# Patient Record
Sex: Female | Born: 1990 | Race: White | Hispanic: No | Marital: Single | Smoking: Current every day smoker
Health system: Southern US, Community
[De-identification: ages and names within clinical notes are randomized; demographics above are authoritative.]

## PROBLEM LIST (undated history)

## (undated) ENCOUNTER — Emergency Department (HOSPITAL_COMMUNITY): Payer: BC Managed Care – PPO | Source: Home / Self Care

## (undated) ENCOUNTER — Inpatient Hospital Stay (HOSPITAL_COMMUNITY): Payer: Self-pay

## (undated) ENCOUNTER — Emergency Department (HOSPITAL_COMMUNITY): Admission: EM | Payer: Self-pay | Source: Home / Self Care

## (undated) DIAGNOSIS — R519 Headache, unspecified: Secondary | ICD-10-CM

## (undated) DIAGNOSIS — F32A Depression, unspecified: Secondary | ICD-10-CM

## (undated) DIAGNOSIS — O98519 Other viral diseases complicating pregnancy, unspecified trimester: Secondary | ICD-10-CM

## (undated) DIAGNOSIS — F419 Anxiety disorder, unspecified: Secondary | ICD-10-CM

## (undated) DIAGNOSIS — B079 Viral wart, unspecified: Secondary | ICD-10-CM

## (undated) DIAGNOSIS — R55 Syncope and collapse: Secondary | ICD-10-CM

## (undated) DIAGNOSIS — Z3490 Encounter for supervision of normal pregnancy, unspecified, unspecified trimester: Secondary | ICD-10-CM

## (undated) DIAGNOSIS — F1120 Opioid dependence, uncomplicated: Secondary | ICD-10-CM

## (undated) DIAGNOSIS — F329 Major depressive disorder, single episode, unspecified: Secondary | ICD-10-CM

## (undated) DIAGNOSIS — F1190 Opioid use, unspecified, uncomplicated: Secondary | ICD-10-CM

## (undated) DIAGNOSIS — B009 Herpesviral infection, unspecified: Secondary | ICD-10-CM

## (undated) DIAGNOSIS — R51 Headache: Secondary | ICD-10-CM

## (undated) DIAGNOSIS — A6000 Herpesviral infection of urogenital system, unspecified: Secondary | ICD-10-CM

## (undated) DIAGNOSIS — J189 Pneumonia, unspecified organism: Secondary | ICD-10-CM

## (undated) HISTORY — PX: OTHER SURGICAL HISTORY: SHX169

## (undated) HISTORY — DX: Herpesviral infection of urogenital system, unspecified: A60.00

## (undated) HISTORY — DX: Herpesviral infection, unspecified: B00.9

## (undated) HISTORY — DX: Viral wart, unspecified: B07.9

## (undated) HISTORY — PX: TONSILLECTOMY: SUR1361

## (undated) HISTORY — DX: Encounter for supervision of normal pregnancy, unspecified, unspecified trimester: Z34.90

## (undated) HISTORY — DX: Other viral diseases complicating pregnancy, unspecified trimester: O98.519

---
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
## 2012-01-26 ENCOUNTER — Encounter (HOSPITAL_COMMUNITY): Payer: Self-pay | Admitting: *Deleted

## 2012-01-26 ENCOUNTER — Emergency Department (HOSPITAL_COMMUNITY)
Admission: EM | Admit: 2012-01-26 | Discharge: 2012-01-26 | Disposition: A | Discharge status: Home / Self Care | Payer: BC Managed Care – PPO | Attending: Emergency Medicine | Admitting: Emergency Medicine

## 2012-01-26 DIAGNOSIS — K029 Dental caries, unspecified: Secondary | ICD-10-CM | POA: Insufficient documentation

## 2012-01-26 DIAGNOSIS — K0889 Other specified disorders of teeth and supporting structures: Secondary | ICD-10-CM

## 2012-01-26 MED ORDER — PENICILLIN V POTASSIUM 500 MG PO TABS: 500.0000 mg | ORAL_TABLET | Freq: Four times a day (QID) | ORAL | Status: AC

## 2012-01-26 MED ORDER — PENICILLIN V POTASSIUM 250 MG PO TABS
500.0000 mg | ORAL_TABLET | Freq: Once | ORAL | Status: AC
  Administered 2012-01-26: 500 mg via ORAL
  Filled 2012-01-26: qty 2

## 2012-01-26 MED ORDER — IBUPROFEN 800 MG PO TABS
800.0000 mg | ORAL_TABLET | Freq: Once | ORAL | Status: AC
  Administered 2012-01-26: 800 mg via ORAL
  Filled 2012-01-26: qty 1

## 2012-01-26 MED ORDER — HYDROCODONE-ACETAMINOPHEN 5-325 MG PO TABS: 1.0000 | ORAL_TABLET | Freq: Four times a day (QID) | ORAL | Status: AC | PRN

## 2012-01-26 MED ORDER — HYDROCODONE-ACETAMINOPHEN 5-325 MG PO TABS
1.0000 | ORAL_TABLET | Freq: Once | ORAL | Status: AC
  Administered 2012-01-26: 1 via ORAL
  Filled 2012-01-26: qty 1

## 2012-01-26 NOTE — ED Provider Notes (Signed)
History     CSN: [id number]  Arrival date & time 01/26/12  4098   First MD Initiated Contact with Patient 01/26/12 1933      Chief Complaint  Patient presents with  . Dental Pain  . Jaw Pain    (Consider location/radiation/quality/duration/timing/severity/associated sxs/prior treatment) HPI Comments: "i think my wisdom teeth are coming in".  Has dental appt in 5 days.  No fever or chills.  Patient is a 21 y.o. female presenting with tooth pain. The history is provided by the patient. No language interpreter was used.  Dental PainThe primary symptoms include mouth pain. Primary symptoms do not include dental injury or fever. Episode onset: 1 month ago. The symptoms are unchanged. The symptoms occur constantly.  Additional symptoms include: jaw pain. Additional symptoms do not include: gum swelling, trismus, facial swelling, ear pain and swollen glands.    History reviewed. No pertinent past medical history.  Past Surgical History  Procedure Date  . Tonsillectomy     History reviewed. No pertinent family history.  History  Substance Use Topics  . Smoking status: Never Smoker   . Smokeless tobacco: Never Used  . Alcohol Use: No    OB History    Grav Para Term Preterm Abortions TAB SAB Ect Mult Living                  Review of Systems  Constitutional: Negative for fever and chills.  HENT: Positive for dental problem. Negative for ear pain and facial swelling.   All other systems reviewed and are negative.    Allergies  Review of patient's allergies indicates no known allergies.  Home Medications   Current Outpatient Rx  Name Route Sig Dispense Refill  . HYDROCODONE-ACETAMINOPHEN 5-325 MG PO TABS Oral Take 1 tablet by mouth every 6 (six) hours as needed for pain. 20 tablet 0  . LIDOCAINE 5 % EX OINT Topical Apply topically as needed. Apply with Q-tip to most problematic areas sparingly 35.44 g 0  . PENICILLIN V POTASSIUM 500 MG PO TABS Oral Take 1 tablet (500  mg total) by mouth 4 (four) times daily. 40 tablet 0  . SENNOSIDES-DOCUSATE SODIUM 8.6-50 MG PO TABS Oral Take 2 tablets by mouth daily. 14 tablet 0  . VALACYCLOVIR HCL 1 G PO TABS Oral Take 1 tablet (1,000 mg total) by mouth 2 (two) times daily. 20 tablet 0    BP 102/64  Pulse 84  Temp 98.4 F (36.9 C) (Oral)  Resp 20  Ht 5\' 9"  (1.753 m)  Wt 230 lb (104.327 kg)  BMI 33.96 kg/m2  SpO2 100%  LMP 01/26/2012  Physical Exam  Nursing note and vitals reviewed. Constitutional: She is oriented to person, place, and time. She appears well-developed and well-nourished. No distress.  HENT:  Head: Normocephalic and atraumatic.  Mouth/Throat: Mucous membranes are normal. Normal dentition. Dental caries present. No uvula swelling. No oropharyngeal exudate, posterior oropharyngeal edema, posterior oropharyngeal erythema or tonsillar abscesses.    Eyes: EOM are normal.  Neck: Normal range of motion.  Cardiovascular: Normal rate, regular rhythm and normal heart sounds.   Pulmonary/Chest: Effort normal and breath sounds normal.  Abdominal: Soft. She exhibits no distension. There is no tenderness.  Musculoskeletal: Normal range of motion.  Neurological: She is alert and oriented to person, place, and time.  Skin: Skin is warm and dry.  Psychiatric: She has a normal mood and affect. Judgment normal.    ED Course  Procedures (including critical care time)  Labs  Reviewed - No data to display No results found.   1. Pain, dental       MDM  rx-hydrocodone, 20 rx-pen VK 500 mg, 40 Ibuprofen 800 mg TID F/u with dentist as planned.        Evalina Field, [place] 01/26/12 1946

## 2012-01-26 NOTE — ED Notes (Signed)
Pt states she believes her wisdom teeth are coming in and they are causing her pain in her jaw.

## 2012-01-27 NOTE — ED Provider Notes (Signed)
Medical screening examination/treatment/procedure(s) were performed by non-physician practitioner and as supervising physician I was immediately available for consultation/collaboration.   Carleene Cooper III, MD 01/27/12 [phone]

## 2012-04-29 LAB — OB RESULTS CONSOLE RPR: RPR: NONREACTIVE

## 2012-04-29 LAB — OB RESULTS CONSOLE ANTIBODY SCREEN: Antibody Screen: NEGATIVE

## 2012-04-29 LAB — OB RESULTS CONSOLE VARICELLA ZOSTER ANTIBODY, IGG: Varicella: IMMUNE

## 2012-04-29 LAB — OB RESULTS CONSOLE ABO/RH

## 2012-04-29 LAB — OB RESULTS CONSOLE GC/CHLAMYDIA
Chlamydia: NEGATIVE
Gonorrhea: NEGATIVE

## 2012-04-29 LAB — CYSTIC FIBROSIS DIAGNOSTIC STUDY: Interpretation-CFDNA:: POSITIVE

## 2012-04-29 LAB — OB RESULTS CONSOLE HIV ANTIBODY (ROUTINE TESTING): HIV: NONREACTIVE

## 2012-08-12 LAB — OB RESULTS CONSOLE RPR: RPR: NONREACTIVE

## 2012-08-18 ENCOUNTER — Encounter: Payer: Self-pay | Admitting: *Deleted

## 2012-08-18 DIAGNOSIS — B079 Viral wart, unspecified: Secondary | ICD-10-CM

## 2012-08-18 DIAGNOSIS — B009 Herpesviral infection, unspecified: Secondary | ICD-10-CM

## 2012-09-02 ENCOUNTER — Encounter: Payer: Self-pay | Admitting: Obstetrics & Gynecology

## 2012-09-18 ENCOUNTER — Encounter: Payer: Self-pay | Admitting: Obstetrics and Gynecology

## 2012-09-18 ENCOUNTER — Ambulatory Visit (INDEPENDENT_AMBULATORY_CARE_PROVIDER_SITE_OTHER): Payer: Medicaid Other | Admitting: Obstetrics and Gynecology

## 2012-09-18 VITALS — BP 102/60 | Wt 277.4 lb

## 2012-09-18 DIAGNOSIS — O98519 Other viral diseases complicating pregnancy, unspecified trimester: Secondary | ICD-10-CM

## 2012-09-18 DIAGNOSIS — Z3403 Encounter for supervision of normal first pregnancy, third trimester: Secondary | ICD-10-CM

## 2012-09-18 DIAGNOSIS — Z1389 Encounter for screening for other disorder: Secondary | ICD-10-CM

## 2012-09-18 DIAGNOSIS — B009 Herpesviral infection, unspecified: Secondary | ICD-10-CM

## 2012-09-18 DIAGNOSIS — E669 Obesity, unspecified: Secondary | ICD-10-CM

## 2012-09-18 DIAGNOSIS — Z331 Pregnant state, incidental: Secondary | ICD-10-CM

## 2012-09-18 LAB — POCT URINALYSIS DIPSTICK
Blood, UA: NEGATIVE
Glucose, UA: NEGATIVE
Nitrite, UA: NEGATIVE
Protein, UA: NEGATIVE

## 2012-09-18 MED ORDER — ACYCLOVIR 400 MG PO TABS: 400.0000 mg | ORAL_TABLET | Freq: Two times a day (BID) | ORAL | Status: DC

## 2012-09-18 NOTE — Progress Notes (Signed)
[redacted]w[redacted]d   HSV II supression initiated Rx: Acyclovir 400 bid               Prenatal classes --yes planned              Navel pain: no hernia, rectus diastasis noted, discussed jvf

## 2012-09-18 NOTE — Progress Notes (Signed)
Pain in belly button.

## 2012-09-21 ENCOUNTER — Encounter (HOSPITAL_COMMUNITY): Payer: Self-pay | Admitting: *Deleted

## 2012-09-21 ENCOUNTER — Inpatient Hospital Stay (HOSPITAL_COMMUNITY)
Admission: AD | Admit: 2012-09-21 | Discharge: 2012-09-21 | Disposition: A | Discharge status: Home / Self Care | Payer: BC Managed Care – PPO | Source: Ambulatory Visit | Attending: Obstetrics & Gynecology | Admitting: Obstetrics & Gynecology

## 2012-09-21 DIAGNOSIS — O4700 False labor before 37 completed weeks of gestation, unspecified trimester: Secondary | ICD-10-CM | POA: Insufficient documentation

## 2012-09-21 DIAGNOSIS — R197 Diarrhea, unspecified: Secondary | ICD-10-CM | POA: Insufficient documentation

## 2012-09-21 DIAGNOSIS — O212 Late vomiting of pregnancy: Secondary | ICD-10-CM | POA: Insufficient documentation

## 2012-09-21 DIAGNOSIS — A084 Viral intestinal infection, unspecified: Secondary | ICD-10-CM

## 2012-09-21 DIAGNOSIS — A088 Other specified intestinal infections: Secondary | ICD-10-CM

## 2012-09-21 LAB — URINE MICROSCOPIC-ADD ON

## 2012-09-21 LAB — URINALYSIS, ROUTINE W REFLEX MICROSCOPIC
Glucose, UA: NEGATIVE mg/dL
Hgb urine dipstick: NEGATIVE
Ketones, ur: NEGATIVE mg/dL
Protein, ur: NEGATIVE mg/dL
Urobilinogen, UA: 0.2 mg/dL (ref 0.0–1.0)

## 2012-09-21 MED ORDER — PROMETHAZINE HCL 25 MG PO TABS: 25.0000 mg | ORAL_TABLET | Freq: Four times a day (QID) | ORAL | Status: DC | PRN

## 2012-09-21 NOTE — MAU Note (Signed)
Cramping and vomiting started this morning. No hx of PTL. Also having diarrhea. No one else at home is sick.  Isolation explained.  Pt unable to get urine spec at this time.

## 2012-09-21 NOTE — MAU Provider Note (Signed)
History     CSN: [id number]  Arrival date and time: 09/21/12 1025   None       Chief Complaint  Patient presents with  . Emesis  . Diarrhea  . Abdominal Pain   Emesis  Associated symptoms include abdominal pain, diarrhea and headaches. Pertinent negatives include no chills, dizziness or fever.  Diarrhea  Associated symptoms include abdominal pain, headaches and vomiting. Pertinent negatives include no chills or fever.  Abdominal Pain Associated symptoms include diarrhea, headaches, nausea and vomiting. Pertinent negatives include no constipation, dysuria, fever, frequency or hematuria.    Brittney Nicholson is 22 y.o. G1P who comes in crampy abdominal pain, diarrhea (3 episodes), with 1 episode of vomiting since this morning around 9 am. Pt states that her 4/10 abdominal pain is epigastric and also lower abdominal.  Pt denies any leakage of fluid or bleeding. Pt states that there is some pressure when she pees. Denies pain.  RN Note: Cramping and vomiting started this morning. No hx of PTL. Also having diarrhea. No one else at home is sick. Isolation explained. Pt unable to get urine spec at this time.    OB History   Grav Para Term Preterm Abortions TAB SAB Ect Mult Living   1         0      Past Medical History  Diagnosis Date  . HSV-2 infection complicating pregnancy   . Warts     Past Surgical History  Procedure Laterality Date  . Breast lumpectomy  2006  . Tonsillectomy  2011  . Right arm Right     has pins.    Family History  Problem Relation Age of Onset  . Cancer Mother     uterine  . Diabetes Father   . Kidney failure Father     History  Substance Use Topics  . Smoking status: Never Smoker   . Smokeless tobacco: Never Used  . Alcohol Use: No    Allergies:  Allergies  Allergen Reactions  . Gluten Other (See Comments)    RECTAL BLEEDING  . Wheat Other (See Comments)    Rectal bleeding    Prescriptions prior to admission  Medication Sig  Dispense Refill  . acetaminophen (TYLENOL) 500 MG tablet Take 500 mg by mouth every 6 (six) hours as needed for pain.      . Prenatal Vit-Fe Fumarate-FA (PRENATAL MULTIVITAMIN) TABS Take 1 tablet by mouth daily at 12 noon.      Marland Kitchen acyclovir (ZOVIRAX) 400 MG tablet Take 1 tablet (400 mg total) by mouth 2 (two) times daily.  60 tablet  2    Review of Systems  Constitutional: Negative for fever and chills.  Eyes: Positive for photophobia. Negative for blurred vision and double vision.  Respiratory: Negative.   Cardiovascular: Negative.   Gastrointestinal: Positive for heartburn, nausea, vomiting, abdominal pain and diarrhea. Negative for constipation and blood in stool.  Genitourinary: Negative for dysuria, urgency, frequency, hematuria and flank pain.  Neurological: Positive for headaches. Negative for dizziness.   Physical Exam   Temp:  [96.9 F (36.1 C)] 96.9 F (36.1 C) (04/14 1101) Pulse Rate:  [107] 107 (04/14 1101) Resp:  [18] 18 (04/14 1101) BP: (101)/(54) 101/54 mmHg (04/14 1101) SpO2:  [99 %] 99 % (04/14 1101) Weight:  [126.1 kg (278 lb)] 126.1 kg (278 lb) (04/14 1147)    Physical Exam  Constitutional: She is oriented to person, place, and time. She appears well-developed and well-nourished.  HENT:  Head:  Normocephalic and atraumatic.  Eyes: EOM are normal.  Cardiovascular: Normal rate, regular rhythm, normal heart sounds and intact distal pulses.   Respiratory: Breath sounds normal. No respiratory distress. She has no wheezes. She has no rales. She exhibits no tenderness.  GI: Soft. She exhibits no distension. There is no tenderness. There is no rebound.  Musculoskeletal: She exhibits edema (1+ LE edema bilaterally).  Neurological: She is alert and oriented to person, place, and time.   Results for orders placed during the hospital encounter of 09/21/12 (from the past 24 hour(s))  URINALYSIS, ROUTINE W REFLEX MICROSCOPIC     Status: Abnormal   Collection Time     09/21/12 11:59 AM      Result Value Range   Color, Urine YELLOW  YELLOW   APPearance HAZY (*) CLEAR   Specific Gravity, Urine 1.020  1.005 - 1.030   pH 6.5  5.0 - 8.0   Glucose, UA NEGATIVE  NEGATIVE mg/dL   Hgb urine dipstick NEGATIVE  NEGATIVE   Bilirubin Urine NEGATIVE  NEGATIVE   Ketones, ur NEGATIVE  NEGATIVE mg/dL   Protein, ur NEGATIVE  NEGATIVE mg/dL   Urobilinogen, UA 0.2  0.0 - 1.0 mg/dL   Nitrite NEGATIVE  NEGATIVE   Leukocytes, UA SMALL (*) NEGATIVE  URINE MICROSCOPIC-ADD ON     Status: Abnormal   Collection Time    09/21/12 11:59 AM      Result Value Range   Squamous Epithelial / LPF MANY (*) RARE   WBC, UA 3-6  <3 WBC/hpf   Bacteria, UA MANY (*) RARE   Fetal heart rate reactive.  Rare contractions. There are items on tracing that look like squared-off contractions, but RN sat at bedside and did not feel any contractions in over 15 minutes.  Patient states when she has a stomach cramp, she holds her breath until it is gone.    MAU Course  Procedures  MDM On toco and fetal heart monitoring.   Assessment and Plan  A:  SIUP at [redacted]w[redacted]d       Probable viral gastroenterits, diarrhea predominant (only vomited once)      Occasional contractions FHR reassuring baselines 140'.   P:  pt discharged home.        Return to clinic if symptoms worsen or fail to improve.        Encouraged PO hydration, avoid concentrated sweet juices while having diarrhea as it may make it worse       Follow up at [facility]  Rosalee Kaufman 09/21/2012, 1:16 PM

## 2012-09-21 NOTE — MAU Provider Note (Signed)
Attestation of Attending Supervision of Advanced Practitioner (CNM/NP): Evaluation and management procedures were performed by the Advanced Practitioner under my supervision and collaboration. I have reviewed the Advanced Practitioner's note and chart, and I agree with the management and plan.  Legacy Carrender H. 5:08 PM   

## 2012-09-22 LAB — URINE CULTURE: Colony Count: 70000

## 2012-09-29 ENCOUNTER — Telehealth: Payer: Self-pay | Admitting: *Deleted

## 2012-09-29 NOTE — Telephone Encounter (Signed)
Pt states "lost mucous plug today", + FM, no bleeding, no contractions, explained to pt if she has contractions 5-10 minutes apart or has gush of fluids go to [facility]. Pt has an appt Friday for routine prenatal visit. Pt verbalized understanding.

## 2012-10-02 ENCOUNTER — Encounter: Payer: Medicaid Other | Admitting: Obstetrics & Gynecology

## 2012-10-06 ENCOUNTER — Ambulatory Visit (INDEPENDENT_AMBULATORY_CARE_PROVIDER_SITE_OTHER): Payer: Medicaid Other | Admitting: Obstetrics & Gynecology

## 2012-10-06 ENCOUNTER — Encounter: Payer: Self-pay | Admitting: Obstetrics & Gynecology

## 2012-10-06 VITALS — BP 110/60 | Wt 284.0 lb

## 2012-10-06 DIAGNOSIS — O98519 Other viral diseases complicating pregnancy, unspecified trimester: Secondary | ICD-10-CM

## 2012-10-06 DIAGNOSIS — E669 Obesity, unspecified: Secondary | ICD-10-CM

## 2012-10-06 DIAGNOSIS — Z3403 Encounter for supervision of normal first pregnancy, third trimester: Secondary | ICD-10-CM

## 2012-10-06 DIAGNOSIS — Z331 Pregnant state, incidental: Secondary | ICD-10-CM

## 2012-10-06 DIAGNOSIS — Z1389 Encounter for screening for other disorder: Secondary | ICD-10-CM

## 2012-10-06 NOTE — Progress Notes (Signed)
Pelvic pain and pressure.Labia swelling at night.

## 2012-10-06 NOTE — Patient Instructions (Signed)
Epidural Risks and Benefits The continuous putting in (infusion) of local anesthetics through a long, narrow, hollow plastic tube (catheter)/needle into the lower (lumbar) area of your spine is commonly called an epidural. This means outside the covering of the spinal cord. The epidural catheter is placed in the space on the outside of the membrane that covers the spinal cord. The anesthetic medicine numbs the nerves of the spinal cord in the epidural space. There is also a spinal/epidural anesthetic using two needles and a catheter. The medication is first placed in the spinal canal. Then that needle is removed and a catheter is placed in the epidural space through the second needle for continuous anesthesia. This seems to be the most popular type of regional anesthesia used now. This is sometimes given for pain management to women who are giving birth. Spinal and epidural anesthesia are called regional anesthesia because they numb a certain region of the body. While it is an effective pain management tool, some reasons not to use this include:  Restricted mobility: The tubes and monitors connected to you do not allow for much moving around.  Increased likelihood of bladder catheterization, oxytocin administration, and internal monitoring. This means a tube (catheter) may have to be put into the bladder to drain the urine. Uterine contractions can become weaker and less frequent. They also may have a higher use of oxytocin than mothers not having regional anesthesia.  Increased likelihood of operative delivery: This includes the use of or need for forceps, vacuum extractor, episiotomy, or cesarean delivery. When the dose is too large, or when it sinks down into the "tailbone" (sacral) region of the body, the perineum and the birth canal (vagina) are anesthetized. Anesthetic is injected into this area late in labor to deaden all sensation. When it "accidentally" happens earlier in labor, the muscles of the  pelvic floor are relaxed too early. This interferes with the normal flexion and rotation of the baby's head as it passes through the birth canal. This interference can lead to abnormal presentations that are more dangerous for the baby.  Must use an automatic blood pressure cuff throughout labor. This is a cuff that automatically takes your blood pressure at regular intervals. SHORT TERM MATERNAL RISKS  Dural puncture - The dura is one of the membranes surrounding the spinal cord. If the anesthetic medication gets into the spinal canal through a dural puncture, it can result in a spinal anesthetic and spinal headache. Spinal headaches are treated with an epidural blood patch to cover the punctured area.  Low blood pressure (hypotension) - Nearly one third of women with an epidural will develop low blood pressure. The ways that patients must lay during the epidural can make this worse. Their position is limited because they will be unable to move their legs easily for the time of the anesthetic. Low blood pressure is also a risk for the baby. If the baby does not get enough oxygen from the mom's blood, it can result in an emergency Cesarean section. This means the baby is delivered by an operation through a cut by the surgeon (incision) on the belly of the mother.  Nausea, vomiting, and prolonged shivering.  Prolonged labor - With large doses of anesthetic medication, the patient loses the desire and the ability to bear down and push. This results in an increased use of forceps and vacuum extractions, compared to women having unmedicated deliveries.  Uneven, incomplete or non-existent pain relief. Sometimes the epidural does not work well and   additional medications may be needed for pain relief.  Difficulty breathing well or paralysis if the level of anesthesia goes too high in the spine.  Convulsions - If the anesthetic agent accidentally is injected into a blood vessel it can cause convulsions and  loss of consciousness.  Toxic drug reactions.  Septic meningitis - An abscess can form at the site where the epidural catheter is placed. If this spreads into the spinal canal it can cause meningitis.  Allergic reaction - This causes blood pressure to become too low and other medications and fluids must be given to bring the blood pressure up. Also rashes and difficulty breathing may develop.  Cardiac arrest - This is rare but real threat to the life of the mother and baby.  Fever is common.  Itching that is easily treated.  Spinal hematoma. LONG TERM MATERNAL RISKS  Neurological complications - A nerve problem called Horner's syndrome can develop with epidural anesthesia for vaginal delivery. It is impossible to predict which patients will develop a Horner's syndrome. Even the nerves to the face can be blocked, temporarily or permanently. Tremors and shakes can occur.  Paresthesia ("pins and needles"). This is a feeling that comes from inflammation of a nerve.  Dizziness and fainting can become a problem after epidurals. This is usually only for a couple of days. RISKS TO BABY  Direct drug toxicity.  Fetal distress, abnormal fetal heart rate (FHR) (can lead to emergency cesarean). This is especially true if the anesthetic gets into the mother's blood stream or too much medication is put into the epidural. REASONS NOT TO HAVE EPIDURAL ANESTHESIA  Increased costs.  The mother has a low blood pressure.  There are blood clotting problems.  A brain tumor is present.  There is an infection in the blood stream.  A skin infection at the needle site.  A tattoo at the needle site. BENEFITS  Regional anesthesia is the most effective pain relief for labor and delivery.  It is the best anesthetic for preeclampsia and eclampsia.  There is better pain control after delivery (vaginal or cesarean).  When done correctly, no medication gets to the baby.  Sooner ambulation after  delivery.  It can be left in place during all of labor.  You can be awake during a Cesarean delivery and see the baby immediately after delivery. AFTER THE PROCEDURE   You will be kept in bed for several hours to prevent headaches.  You will be kept in bed until your legs are no longer numb and it is safe to walk.  The length of time you spend in the hospital will depend on the type of surgery or procedure you have had.  The epidural catheter is removed after you no longer need it for pain. HOME CARE INSTRUCTIONS   Do not drive or operate any kind of machinery for at least 24 hours. Make sure there is someone to drive you home.  Do not drink alcohol for at least 24 hours after the anesthesia.  Do not make important decisions for at least 24 hours after the anesthesia.  Drink lots of fluids.  Return to your normal diet.  Keep all your postoperative appointments as scheduled. SEEK IMMEDIATE MEDICAL CARE IF:  You develop a fever or temperature over 98.6 F (37 C).  You have a persistent headache.  You develop dizziness, fainting or lightheadedness.  You develop weakness, numbness or tingling in your arms or legs.  You have a skin rash.  You   have difficulty breathing  You have a stiff neck with or without stiff back.  You develop chest pain. Document Released: 05/27/2005 Document Revised: 08/19/2011 Document Reviewed: 07/04/2008 Exit[employer] Patient Information 2013 [employer].  

## 2012-10-06 NOTE — Progress Notes (Signed)
BP weight and urine results all reviewed and noted. Patient reports good fetal movement, denies any bleeding and no rupture of membranes symptoms or regular contractions. Patient is without complaints. All questions were answered.  

## 2012-10-06 NOTE — Addendum Note (Signed)
Addended by: Richardson Chiquito on: 10/06/2012 12:25 PM   Modules accepted: Orders

## 2012-10-07 LAB — GC/CHLAMYDIA PROBE AMP
CT Probe RNA: NEGATIVE
GC Probe RNA: NEGATIVE

## 2012-10-14 ENCOUNTER — Encounter: Payer: Self-pay | Admitting: Obstetrics & Gynecology

## 2012-10-14 ENCOUNTER — Ambulatory Visit (INDEPENDENT_AMBULATORY_CARE_PROVIDER_SITE_OTHER): Payer: Medicaid Other | Admitting: Obstetrics & Gynecology

## 2012-10-14 VITALS — BP 110/70 | Wt 288.0 lb

## 2012-10-14 DIAGNOSIS — Z331 Pregnant state, incidental: Secondary | ICD-10-CM

## 2012-10-14 DIAGNOSIS — Z1389 Encounter for screening for other disorder: Secondary | ICD-10-CM

## 2012-10-14 DIAGNOSIS — O98519 Other viral diseases complicating pregnancy, unspecified trimester: Secondary | ICD-10-CM

## 2012-10-14 DIAGNOSIS — E669 Obesity, unspecified: Secondary | ICD-10-CM

## 2012-10-14 LAB — POCT URINALYSIS DIPSTICK: Protein, UA: NEGATIVE

## 2012-10-14 NOTE — Progress Notes (Signed)
BP weight and urine results all reviewed and noted. Patient reports good fetal movement, denies any bleeding and no rupture of membranes symptoms or regular contractions. Patient is without complaints. All questions were answered.  

## 2012-10-14 NOTE — Patient Instructions (Signed)
Breastfeeding Deciding to breastfeed is one of the best choices you can make for you and your baby. The information that follows gives a brief overview of the benefits of breastfeeding as well as common topics surrounding breastfeeding. BENEFITS OF BREASTFEEDING For the baby  The first milk (colostrum) helps the baby's digestive system function better.   There are antibodies in the mother's milk that help the baby fight off infections.   The baby has a lower incidence of asthma, allergies, and sudden infant death syndrome (SIDS).   The nutrients in breast milk are better for the baby than infant formulas, and breast milk helps the baby's brain grow better.   Babies who breastfeed have less gas, colic, and constipation.  For the mother  Breastfeeding helps develop a very special bond between the mother and her baby.   Breastfeeding is convenient, always available at the correct temperature, and costs nothing.   Breastfeeding burns calories in the mother and helps her lose weight that was gained during pregnancy.   Breastfeeding makes the uterus contract back down to normal size faster and slows bleeding following delivery.   Breastfeeding mothers have a lower risk of developing breast cancer.  BREASTFEEDING FREQUENCY  A healthy, full-term baby may breastfeed as often as every hour or space his or her feedings to every 3 hours.   Watch your baby for signs of hunger. Nurse your baby if he or she shows signs of hunger. How often you nurse will vary from baby to baby.   Nurse as often as the baby requests, or when you feel the need to reduce the fullness of your breasts.   Awaken the baby if it has been 3 4 hours since the last feeding.   Frequent feeding will help the mother make more milk and will help prevent problems, such as sore nipples and engorgement of the breasts.  BABY'S POSITION AT THE BREAST  Whether lying down or sitting, be sure that the baby's tummy is  facing your tummy.   Support the breast with 4 fingers underneath the breast and the thumb above. Make sure your fingers are well away from the nipple and baby's mouth.   Stroke the baby's lips gently with your finger or nipple.   When the baby's mouth is open wide enough, place all of your nipple and as much of the areola as possible into your baby's mouth.   Pull the baby in close so the tip of the nose and the baby's cheeks touch the breast during the feeding.  FEEDINGS AND SUCTION  The length of each feeding varies from baby to baby and from feeding to feeding.   The baby must suck about 2 3 minutes for your milk to get to him or her. This is called a "let down." For this reason, allow the baby to feed on each breast as long as he or she wants. Your baby will end the feeding when he or she has received the right balance of nutrients.   To break the suction, put your finger into the corner of the baby's mouth and slide it between his or her gums before removing your breast from his or her mouth. This will help prevent sore nipples.  HOW TO TELL WHETHER YOUR BABY IS GETTING ENOUGH BREAST MILK. Wondering whether or not your baby is getting enough milk is a common concern among mothers. You can be assured that your baby is getting enough milk if:   Your baby is actively   sucking and you hear swallowing.   Your baby seems relaxed and satisfied after a feeding.   Your baby nurses at least 8 12 times in a 24 hour time period. Nurse your baby until he or she unlatches or falls asleep at the first breast (at least 10 20 minutes), then offer the second side.   Your baby is wetting 5 6 disposable diapers (6 8 cloth diapers) in a 24 hour period by 5 6 days of age.   Your baby is having at least 3 4 stools every 24 hours for the first 6 weeks. The stool should be soft and yellow.   Your baby should gain 4 7 ounces per week after he or she is 4 days old.   Your breasts feel softer  after nursing.  REDUCING BREAST ENGORGEMENT  In the first week after your baby is born, you may experience signs of breast engorgement. When breasts are engorged, they feel heavy, warm, full, and may be tender to the touch. You can reduce engorgement if you:   Nurse frequently, every 2 3 hours. Mothers who breastfeed early and often have fewer problems with engorgement.   Place light ice packs on your breasts for 10 20 minutes between feedings. This reduces swelling. Wrap the ice packs in a lightweight towel to protect your skin. Bags of frozen vegetables work well for this purpose.   Take a warm shower or apply warm, moist heat to your breast for 5 10 minutes just before each feeding. This increases circulation and helps the milk flow.   Gently massage your breast before and during the feeding. Using your finger tips, massage from the chest wall towards your nipple in a circular motion.   Make sure that the baby empties at least one breast at every feeding before switching sides.   Use a breast pump to empty the breasts if your baby is sleepy or not nursing well. You may also want to pump if you are returning to work oryou feel you are getting engorged.   Avoid bottle feeds, pacifiers, or supplemental feedings of water or juice in place of breastfeeding. Breast milk is all the food your baby needs. It is not necessary for your baby to have water or formula. In fact, to help your breasts make more milk, it is best not to give your baby supplemental feedings during the early weeks.   Be sure the baby is latched on and positioned properly while breastfeeding.   Wear a supportive bra, avoiding underwire styles.   Eat a balanced diet with enough fluids.   Rest often, relax, and take your prenatal vitamins to prevent fatigue, stress, and anemia.  If you follow these suggestions, your engorgement should improve in 24 48 hours. If you are still experiencing difficulty, call your  lactation consultant or caregiver.  CARING FOR YOURSELF Take care of your breasts  Bathe or shower daily.   Avoid using soap on your nipples.   Start feedings on your left breast at one feeding and on your right breast at the next feeding.   You will notice an increase in your milk supply 2 5 days after delivery. You may feel some discomfort from engorgement, which makes your breasts very firm and often tender. Engorgement "peaks" out within 24 48 hours. In the meantime, apply warm moist towels to your breasts for 5 10 minutes before feeding. Gentle massage and expression of some milk before feeding will soften your breasts, making it easier for your   baby to latch on.   Wear a well-fitting nursing bra, and air dry your nipples for a 3 4minutes after each feeding.   Only use cotton bra pads.   Only use pure lanolin on your nipples after nursing. You do not need to wash it off before feeding the baby again. Another option is to express a few drops of breast milk and gently massage it into your nipples.  Take care of yourself  Eat well-balanced meals and nutritious snacks.   Drinking milk, fruit juice, and water to satisfy your thirst (about 8 glasses a day).   Get plenty of rest.  Avoid foods that you notice affect the baby in a bad way.  SEEK MEDICAL CARE IF:   You have difficulty with breastfeeding and need help.   You have a hard, red, sore area on your breast that is accompanied by a fever.   Your baby is too sleepy to eat well or is having trouble sleeping.   Your baby is wetting less than 6 diapers a day, by 5 days of age.   Your baby's skin or white part of his or her eyes is more yellow than it was in the hospital.   You feel depressed.  Document Released: 05/27/2005 Document Revised: 11/26/2011 Document Reviewed: 08/25/2011 [employer] Patient Information 2013 [employer].  

## 2012-10-14 NOTE — Progress Notes (Signed)
VAGINAL DISCHARGE, WHITE IN COLOR. ITCHING AND BURNING AT NIGHT.

## 2012-10-21 ENCOUNTER — Encounter: Payer: Medicaid Other | Admitting: Obstetrics & Gynecology

## 2012-10-22 ENCOUNTER — Ambulatory Visit (INDEPENDENT_AMBULATORY_CARE_PROVIDER_SITE_OTHER): Payer: Medicaid Other | Admitting: Obstetrics & Gynecology

## 2012-10-22 VITALS — BP 100/60 | Wt 293.0 lb

## 2012-10-22 DIAGNOSIS — Z3403 Encounter for supervision of normal first pregnancy, third trimester: Secondary | ICD-10-CM

## 2012-10-22 DIAGNOSIS — Z3400 Encounter for supervision of normal first pregnancy, unspecified trimester: Secondary | ICD-10-CM | POA: Insufficient documentation

## 2012-10-22 DIAGNOSIS — Z331 Pregnant state, incidental: Secondary | ICD-10-CM

## 2012-10-22 DIAGNOSIS — O98519 Other viral diseases complicating pregnancy, unspecified trimester: Secondary | ICD-10-CM

## 2012-10-22 DIAGNOSIS — O99210 Obesity complicating pregnancy, unspecified trimester: Secondary | ICD-10-CM

## 2012-10-22 DIAGNOSIS — Z1389 Encounter for screening for other disorder: Secondary | ICD-10-CM

## 2012-10-22 LAB — POCT URINALYSIS DIPSTICK
Glucose, UA: NEGATIVE
Ketones, UA: NEGATIVE

## 2012-10-22 NOTE — Progress Notes (Signed)
BP weight and urine results all reviewed and noted. Patient reports good fetal movement, denies any bleeding and no rupture of membranes symptoms or regular contractions. Patient is without complaints. All questions were answered.  

## 2012-10-26 LAB — POCT URINALYSIS DIPSTICK
Ketones, UA: NEGATIVE
Nitrite, UA: NEGATIVE

## 2012-10-26 NOTE — Addendum Note (Signed)
Addended by: Richardson Chiquito on: 10/26/2012 01:55 PM   Modules accepted: Orders

## 2012-10-27 ENCOUNTER — Encounter: Payer: Self-pay | Admitting: Advanced Practice Midwife

## 2012-10-27 ENCOUNTER — Ambulatory Visit (INDEPENDENT_AMBULATORY_CARE_PROVIDER_SITE_OTHER): Payer: Medicaid Other | Admitting: Advanced Practice Midwife

## 2012-10-27 VITALS — BP 112/76 | Wt 294.0 lb

## 2012-10-27 DIAGNOSIS — O98519 Other viral diseases complicating pregnancy, unspecified trimester: Secondary | ICD-10-CM

## 2012-10-27 DIAGNOSIS — O99210 Obesity complicating pregnancy, unspecified trimester: Secondary | ICD-10-CM

## 2012-10-27 DIAGNOSIS — Z3403 Encounter for supervision of normal first pregnancy, third trimester: Secondary | ICD-10-CM

## 2012-10-27 DIAGNOSIS — Z1389 Encounter for screening for other disorder: Secondary | ICD-10-CM

## 2012-10-27 DIAGNOSIS — Z331 Pregnant state, incidental: Secondary | ICD-10-CM

## 2012-10-27 LAB — POCT URINALYSIS DIPSTICK
Blood, UA: NEGATIVE
Nitrite, UA: NEGATIVE

## 2012-10-27 NOTE — Progress Notes (Signed)
No c/o at this time.  Routine questions about pregnancy answered.  F/U in 1 weeks for LROB.  

## 2012-10-27 NOTE — Progress Notes (Signed)
C/o "alot of vaginal pressure"

## 2012-10-29 ENCOUNTER — Encounter: Payer: Medicaid Other | Admitting: Advanced Practice Midwife

## 2012-11-03 ENCOUNTER — Ambulatory Visit (INDEPENDENT_AMBULATORY_CARE_PROVIDER_SITE_OTHER): Payer: Medicaid Other | Admitting: Obstetrics & Gynecology

## 2012-11-03 ENCOUNTER — Telehealth (HOSPITAL_COMMUNITY): Payer: Self-pay | Admitting: *Deleted

## 2012-11-03 VITALS — BP 120/72 | Wt 294.0 lb

## 2012-11-03 DIAGNOSIS — O99210 Obesity complicating pregnancy, unspecified trimester: Secondary | ICD-10-CM

## 2012-11-03 DIAGNOSIS — Z3403 Encounter for supervision of normal first pregnancy, third trimester: Secondary | ICD-10-CM

## 2012-11-03 NOTE — Telephone Encounter (Signed)
Preadmission screen  

## 2012-11-03 NOTE — Progress Notes (Signed)
BP weight and urine results all reviewed and noted. Patient reports good fetal movement, denies any bleeding and no rupture of membranes symptoms or regular contractions. Patient is without complaints. All questions were answered. Induction on Saturday 5.31.2014

## 2012-11-04 ENCOUNTER — Telehealth (HOSPITAL_COMMUNITY): Payer: Self-pay | Admitting: *Deleted

## 2012-11-04 ENCOUNTER — Encounter (HOSPITAL_COMMUNITY): Payer: Self-pay | Admitting: *Deleted

## 2012-11-04 NOTE — Telephone Encounter (Signed)
Preadmission screen  

## 2012-11-07 ENCOUNTER — Encounter (HOSPITAL_COMMUNITY): Payer: Self-pay

## 2012-11-07 ENCOUNTER — Inpatient Hospital Stay (HOSPITAL_COMMUNITY)
Admission: RE | Admit: 2012-11-07 | Discharge: 2012-11-10 | DRG: 372 | Disposition: A | Discharge status: Home / Self Care | Payer: BC Managed Care – PPO | Source: Ambulatory Visit | Attending: Obstetrics and Gynecology | Admitting: Obstetrics and Gynecology

## 2012-11-07 VITALS — BP 111/73 | HR 94 | Temp 97.6°F | Resp 20 | Ht 67.0 in | Wt 294.0 lb

## 2012-11-07 DIAGNOSIS — B009 Herpesviral infection, unspecified: Secondary | ICD-10-CM

## 2012-11-07 DIAGNOSIS — Z141 Cystic fibrosis carrier: Secondary | ICD-10-CM

## 2012-11-07 DIAGNOSIS — Z3403 Encounter for supervision of normal first pregnancy, third trimester: Secondary | ICD-10-CM

## 2012-11-07 DIAGNOSIS — O480 Post-term pregnancy: Principal | ICD-10-CM | POA: Diagnosis present

## 2012-11-07 LAB — CBC
HCT: 36.4 % (ref 36.0–46.0)
Hemoglobin: 11.9 g/dL — ABNORMAL LOW (ref 12.0–15.0)
RBC: 4.42 MIL/uL (ref 3.87–5.11)
WBC: 10.4 10*3/uL (ref 4.0–10.5)

## 2012-11-07 LAB — TYPE AND SCREEN: Antibody Screen: NEGATIVE

## 2012-11-07 MED ORDER — LIDOCAINE HCL (PF) 1 % IJ SOLN
30.0000 mL | INTRAMUSCULAR | Status: DC | PRN
  Filled 2012-11-07: qty 30

## 2012-11-07 MED ORDER — OXYTOCIN BOLUS FROM INFUSION
500.0000 mL | INTRAVENOUS | Status: DC
  Administered 2012-11-08: 500 mL via INTRAVENOUS

## 2012-11-07 MED ORDER — OXYTOCIN 40 UNITS IN LACTATED RINGERS INFUSION - SIMPLE MED
1.0000 m[IU]/min | INTRAVENOUS | Status: DC
  Administered 2012-11-08: 2 m[IU]/min via INTRAVENOUS
  Filled 2012-11-07: qty 1000

## 2012-11-07 MED ORDER — OXYTOCIN 40 UNITS IN LACTATED RINGERS INFUSION - SIMPLE MED: 62.5000 mL/h | INTRAVENOUS | Status: DC

## 2012-11-07 MED ORDER — FLEET ENEMA 7-19 GM/118ML RE ENEM: 1.0000 | ENEMA | RECTAL | Status: DC | PRN

## 2012-11-07 MED ORDER — CITRIC ACID-SODIUM CITRATE 334-500 MG/5ML PO SOLN: 30.0000 mL | ORAL | Status: DC | PRN

## 2012-11-07 MED ORDER — ACETAMINOPHEN 325 MG PO TABS
650.0000 mg | ORAL_TABLET | ORAL | Status: DC | PRN
  Administered 2012-11-08: 650 mg via ORAL
  Filled 2012-11-07: qty 2

## 2012-11-07 MED ORDER — ONDANSETRON HCL 4 MG/2ML IJ SOLN: 4.0000 mg | Freq: Four times a day (QID) | INTRAMUSCULAR | Status: DC | PRN

## 2012-11-07 MED ORDER — LACTATED RINGERS IV SOLN: 500.0000 mL | INTRAVENOUS | Status: DC | PRN

## 2012-11-07 MED ORDER — LACTATED RINGERS IV SOLN
INTRAVENOUS | Status: DC
  Administered 2012-11-07: 950 mL via INTRAVENOUS
  Administered 2012-11-08: 02:00:00 via INTRAVENOUS
  Administered 2012-11-08: 125 mL/h via INTRAVENOUS

## 2012-11-07 MED ORDER — FENTANYL CITRATE 0.05 MG/ML IJ SOLN
100.0000 ug | INTRAMUSCULAR | Status: DC | PRN
  Administered 2012-11-07: 100 ug via INTRAVENOUS
  Filled 2012-11-07: qty 2

## 2012-11-07 MED ORDER — TERBUTALINE SULFATE 1 MG/ML IJ SOLN: 0.2500 mg | Freq: Once | INTRAMUSCULAR | Status: AC | PRN

## 2012-11-07 MED ORDER — IBUPROFEN 600 MG PO TABS: 600.0000 mg | ORAL_TABLET | Freq: Four times a day (QID) | ORAL | Status: DC | PRN

## 2012-11-07 MED ORDER — OXYCODONE-ACETAMINOPHEN 5-325 MG PO TABS: 1.0000 | ORAL_TABLET | ORAL | Status: DC | PRN

## 2012-11-07 NOTE — H&P (Signed)
Brittney Nicholson is a 22 y.o. G1P0 Caucasian female at [redacted]w[redacted]d by 13.4wk u/s, presenting for IOL postdates.  Reports good fm, denies uc's, lof or vb.  Initiated pnc at [facility] @ 13.4wks.  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     Maternal Medical History:  Reason for admission: IOL postdates  Fetal activity: Perceived fetal activity is normal.   Last perceived fetal movement was within the past hour.    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    OB History   Grav Para Term Preterm Abortions TAB SAB Ect Mult Living   1         0     Past Medical History  Diagnosis Date  . HSV-2 infection complicating pregnancy   . Warts    Past Surgical History  Procedure Laterality Date  . Breast lumpectomy  2006  . Tonsillectomy  2011  . Right arm Right     has pins.   Family History: family history includes Cancer in her mother; Diabetes in her father; and Kidney failure in her father. Social History:  reports that she has never smoked. She has never used smokeless tobacco. She reports that she does not drink alcohol or use illicit drugs.  Review of Systems  Constitutional: Negative.   HENT: Negative.   Eyes: Negative.   Respiratory: Negative.   Cardiovascular: Negative.   Gastrointestinal: Negative.   Genitourinary: Negative.   Musculoskeletal: Negative.   Skin: Negative.   Neurological: Negative.   Endo/Heme/Allergies: Negative.   Psychiatric/Behavioral: Negative.       Blood pressure 129/79, pulse 98, height 5\' 7"  (1.702 m), weight 133.358 kg (294 lb), last menstrual period 12/12/2011. Maternal Exam:  Uterine Assessment: Contraction strength is mild.  Contraction frequency is irregular.   Abdomen: Patient reports no  abdominal tenderness. Fetal presentation: vertex  Introitus: Normal vulva. Normal vagina.  Pelvis: adequate for delivery.   Cervix: Cervix evaluated by digital exam.     Fetal Exam Fetal Monitor Review: Mode: ultrasound.   Baseline rate: 135.  Variability: moderate (6-25 bpm).   Pattern: accelerations present and no decelerations.    Fetal State Assessment: Category I - tracings are normal.     Physical Exam  Constitutional: She is oriented to person, place, and time. She appears well-developed and well-nourished.  HENT:  Head: Normocephalic.  Eyes: Pupils are equal, round, and reactive to light.  Neck: Normal range of motion.  Cardiovascular: Normal rate and regular rhythm.   Respiratory: Effort normal and breath sounds normal.  GI: Soft.  gravid  Genitourinary: Vagina normal and uterus normal.  SVE: tight 3/80/-2, vtx Cervical foley bulb inserted and inflated w/ 60ml LR w/o difficulty   Musculoskeletal: Normal range of motion.  Neurological: She is alert and oriented to person, place, and time. She has normal reflexes.  Skin: Skin is warm and dry.  Psychiatric: She has a normal mood and affect. Her behavior is normal. Judgment and thought content normal.    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

## 2012-11-07 NOTE — H&P (Signed)
Attestation of Attending Supervision of Advanced Practitioner (CNM/NP): Evaluation and management procedures were performed by the Advanced Practitioner under my supervision and collaboration.  I have reviewed the Advanced Practitioner's note and chart, and I agree with the management and plan.  HARRAWAY-SMITH, Daviona Herbert 7:17 PM

## 2012-11-07 NOTE — Progress Notes (Signed)
Spoke with pt regarding need to delay induction until later today. Pt denies ctx, bleeding or leaking of fluid. States baby is active. Instructed pt that will call when room is available and pt agreed. Delay approved by Philipp Deputy, CNM.

## 2012-11-08 ENCOUNTER — Encounter (HOSPITAL_COMMUNITY): Payer: Self-pay | Admitting: Anesthesiology

## 2012-11-08 ENCOUNTER — Encounter (HOSPITAL_COMMUNITY): Payer: Self-pay

## 2012-11-08 ENCOUNTER — Inpatient Hospital Stay (HOSPITAL_COMMUNITY): Payer: BC Managed Care – PPO | Admitting: Anesthesiology

## 2012-11-08 DIAGNOSIS — O480 Post-term pregnancy: Secondary | ICD-10-CM

## 2012-11-08 LAB — ABO/RH: ABO/RH(D): O POS

## 2012-11-08 MED ORDER — ZOLPIDEM TARTRATE 5 MG PO TABS: 5.0000 mg | ORAL_TABLET | Freq: Every evening | ORAL | Status: DC | PRN

## 2012-11-08 MED ORDER — SENNOSIDES-DOCUSATE SODIUM 8.6-50 MG PO TABS
2.0000 | ORAL_TABLET | Freq: Every day | ORAL | Status: DC
  Administered 2012-11-09: 2 via ORAL

## 2012-11-08 MED ORDER — FENTANYL 2.5 MCG/ML BUPIVACAINE 1/10 % EPIDURAL INFUSION (WH - ANES)
INTRAMUSCULAR | Status: DC | PRN
  Administered 2012-11-08: 14 mL/h via EPIDURAL

## 2012-11-08 MED ORDER — TETANUS-DIPHTH-ACELL PERTUSSIS 5-2.5-18.5 LF-MCG/0.5 IM SUSP
0.5000 mL | Freq: Once | INTRAMUSCULAR | Status: AC
  Administered 2012-11-09: 0.5 mL via INTRAMUSCULAR

## 2012-11-08 MED ORDER — PRENATAL MULTIVITAMIN CH
1.0000 | ORAL_TABLET | Freq: Every day | ORAL | Status: DC
  Administered 2012-11-09 – 2012-11-10 (×2): 1 via ORAL
  Filled 2012-11-08 (×2): qty 1

## 2012-11-08 MED ORDER — PHENYLEPHRINE 40 MCG/ML (10ML) SYRINGE FOR IV PUSH (FOR BLOOD PRESSURE SUPPORT)
80.0000 ug | PREFILLED_SYRINGE | INTRAVENOUS | Status: DC | PRN
  Filled 2012-11-08: qty 2

## 2012-11-08 MED ORDER — BENZOCAINE-MENTHOL 20-0.5 % EX AERO
1.0000 "application " | INHALATION_SPRAY | CUTANEOUS | Status: DC | PRN
  Administered 2012-11-08: 1 via TOPICAL
  Filled 2012-11-08: qty 56

## 2012-11-08 MED ORDER — WITCH HAZEL-GLYCERIN EX PADS: 1.0000 "application " | MEDICATED_PAD | CUTANEOUS | Status: DC | PRN

## 2012-11-08 MED ORDER — EPHEDRINE 5 MG/ML INJ
10.0000 mg | INTRAVENOUS | Status: DC | PRN
  Filled 2012-11-08: qty 2
  Filled 2012-11-08: qty 4

## 2012-11-08 MED ORDER — ONDANSETRON HCL 4 MG PO TABS: 4.0000 mg | ORAL_TABLET | ORAL | Status: DC | PRN

## 2012-11-08 MED ORDER — ONDANSETRON HCL 4 MG/2ML IJ SOLN: 4.0000 mg | INTRAMUSCULAR | Status: DC | PRN

## 2012-11-08 MED ORDER — DIPHENHYDRAMINE HCL 25 MG PO CAPS: 25.0000 mg | ORAL_CAPSULE | Freq: Four times a day (QID) | ORAL | Status: DC | PRN

## 2012-11-08 MED ORDER — IBUPROFEN 600 MG PO TABS
600.0000 mg | ORAL_TABLET | Freq: Four times a day (QID) | ORAL | Status: DC
  Administered 2012-11-08 – 2012-11-10 (×8): 600 mg via ORAL
  Filled 2012-11-08 (×8): qty 1

## 2012-11-08 MED ORDER — DIBUCAINE 1 % RE OINT: 1.0000 "application " | TOPICAL_OINTMENT | RECTAL | Status: DC | PRN

## 2012-11-08 MED ORDER — PHENYLEPHRINE 40 MCG/ML (10ML) SYRINGE FOR IV PUSH (FOR BLOOD PRESSURE SUPPORT)
80.0000 ug | PREFILLED_SYRINGE | INTRAVENOUS | Status: DC | PRN
  Filled 2012-11-08: qty 2
  Filled 2012-11-08: qty 5

## 2012-11-08 MED ORDER — SODIUM CHLORIDE 0.9 % IV SOLN
INTRAVENOUS | Status: AC
  Administered 2012-11-08: 22:00:00 via INTRAVENOUS

## 2012-11-08 MED ORDER — LACTATED RINGERS IV SOLN: 500.0000 mL | Freq: Once | INTRAVENOUS | Status: DC

## 2012-11-08 MED ORDER — FENTANYL 2.5 MCG/ML BUPIVACAINE 1/10 % EPIDURAL INFUSION (WH - ANES)
14.0000 mL/h | INTRAMUSCULAR | Status: DC | PRN
  Administered 2012-11-08: 14 mL/h via EPIDURAL
  Filled 2012-11-08 (×2): qty 125

## 2012-11-08 MED ORDER — LIDOCAINE HCL (PF) 1 % IJ SOLN
INTRAMUSCULAR | Status: DC | PRN
  Administered 2012-11-08 (×2): 4 mL

## 2012-11-08 MED ORDER — LANOLIN HYDROUS EX OINT: TOPICAL_OINTMENT | CUTANEOUS | Status: DC | PRN

## 2012-11-08 MED ORDER — DIPHENHYDRAMINE HCL 50 MG/ML IJ SOLN: 12.5000 mg | INTRAMUSCULAR | Status: DC | PRN

## 2012-11-08 MED ORDER — EPHEDRINE 5 MG/ML INJ
10.0000 mg | INTRAVENOUS | Status: DC | PRN
  Filled 2012-11-08: qty 2

## 2012-11-08 MED ORDER — OXYTOCIN 40 UNITS IN LACTATED RINGERS INFUSION - SIMPLE MED
250.0000 mL/h | INTRAVENOUS | Status: AC
  Administered 2012-11-08: 250 mL/h via INTRAVENOUS
  Filled 2012-11-08: qty 1000

## 2012-11-08 MED ORDER — OXYCODONE-ACETAMINOPHEN 5-325 MG PO TABS
1.0000 | ORAL_TABLET | ORAL | Status: DC | PRN
  Administered 2012-11-08 (×2): 1 via ORAL
  Administered 2012-11-09 – 2012-11-10 (×6): 2 via ORAL
  Filled 2012-11-08: qty 2
  Filled 2012-11-08 (×2): qty 1
  Filled 2012-11-08 (×5): qty 2

## 2012-11-08 MED ORDER — SIMETHICONE 80 MG PO CHEW: 80.0000 mg | CHEWABLE_TABLET | ORAL | Status: DC | PRN

## 2012-11-08 NOTE — Progress Notes (Signed)
Brittney Nicholson is a 22 y.o. G1P0 at [redacted]w[redacted]d admitted for induction of labor due to postdates.  Subjective: Uncomfortable w/ uc's- mainly in lower back, desires epidural  Objective: BP 113/73  Pulse 89  Temp(Src) 97.7 F (36.5 C) (Oral)  Resp 18  Ht 5\' 7"  (1.702 m)  Wt 133.358 kg (294 lb)  BMI 46.04 kg/m2  LMP 12/12/2011      FHT:  FHR: 145 bpm, variability: minimal ,  accelerations:  Present,  decelerations:  Present occ early UC:   irregular, every 2-5 minutes SVE:   6/80/-2, bbow  Labs: Lab Results  Component Value Date   WBC 10.4 11/07/2012   HGB 11.9* 11/07/2012   HCT 36.4 11/07/2012   MCV 82.4 11/07/2012   PLT 181 11/07/2012    Assessment / Plan: Induction of labor due to postdates,  progressing well on pitocin @ 67mu/min Epidural then arom when comfortable  Labor: Progressing normally Preeclampsia:  n/a Fetal Wellbeing:  Category II Pain Control:  requesting epidural I/D:  n/a Anticipated MOD:  NSVD  Marge Duncans 11/08/2012, 4:02 AM

## 2012-11-08 NOTE — Anesthesia Procedure Notes (Signed)
Epidural Patient location during procedure: OB Start time: 11/08/2012 4:30 AM  Staffing Anesthesiologist: Aleia Larocca A. Performed by: anesthesiologist   Preanesthetic Checklist Completed: patient identified, site marked, surgical consent, pre-op evaluation, timeout performed, IV checked, risks and benefits discussed and monitors and equipment checked  Epidural Patient position: sitting Prep: site prepped and draped and DuraPrep Patient monitoring: continuous pulse ox and blood pressure Approach: midline Injection technique: LOR air  Needle:  Needle type: Tuohy  Needle gauge: 17 G Needle length: 9 cm and 9 Needle insertion depth: 7 cm Catheter type: closed end flexible Catheter size: 19 Gauge Catheter at skin depth: 12 cm Test dose: negative and Other  Assessment Events: blood not aspirated, injection not painful, no injection resistance, negative IV test and no paresthesia  Additional Notes Patient identified. Risks and benefits discussed including failed block, incomplete  Pain control, post dural puncture headache, nerve damage, paralysis, blood pressure Changes, nausea, vomiting, reactions to medications-both toxic and allergic and post Partum back pain. All questions were answered. Patient expressed understanding and wished to proceed. Sterile technique was used throughout procedure. Epidural site was Dressed with sterile barrier dressing. No paresthesias, signs of intravascular injection Or signs of intrathecal spread were encountered.  Patient was more comfortable after the epidural was dosed. Please see RN's note for documentation of vital signs and FHR which are stable.

## 2012-11-08 NOTE — Op Note (Signed)
Delivery Note At 3:56 PM a viable female was delivered via Vaginal, Spontaneous Delivery (Presentation: Left Occiput Anterior).  APGAR: 9, 9; weight .   Placenta status: Intact, Spontaneous.  Cord:  with the following complications: .  Cord pH:   Anesthesia: Epidural  Episiotomy: None Lacerations: second degree, midline, symmetric Suture Repair: 2.0 vicryl continuous running 2-layer closure Est. Blood Loss (mL):   Mom to postpartum.  Baby to nursery-stable.  Brittney Nicholson 11/08/2012, 4:13 PM

## 2012-11-08 NOTE — Progress Notes (Signed)
Awaiting placement of IUPC.  Unable to assess Ctx via external monitor.

## 2012-11-08 NOTE — Progress Notes (Signed)
Ucs hard to assess d/t BMI.  TOCO readjusted.

## 2012-11-08 NOTE — Progress Notes (Signed)
Brittney Nicholson is a 22 y.o. G1P0 at [redacted]w[redacted]d admitted for induction of labor due to postdates.  Subjective: Feeling somewhat uncomfortable w/ uc's  Objective: BP 135/110  Pulse 95  Temp(Src) 98.1 F (36.7 C) (Oral)  Resp 18  Ht 5\' 7"  (1.702 m)  Wt 133.358 kg (294 lb)  BMI 46.04 kg/m2  LMP 12/12/2011  BP not accurate- RN to retake    FHT:  FHR: 135 bpm, variability: moderate,  accelerations:  Present,  decelerations:  Absent UC:   Irregular, mild SVE:   Dilation: 4.5 Effacement (%): 70 Station: -2 Exam by:: Jimmye Norman RN Foley bulb out  Labs: Lab Results  Component Value Date   WBC 10.4 11/07/2012   HGB 11.9* 11/07/2012   HCT 36.4 11/07/2012   MCV 82.4 11/07/2012   PLT 181 11/07/2012    Assessment / Plan: IOL d/t postdates, cervical foley bulb now out, RN to initiate pitocin per protocol to achieve adequate labor/dilation  Labor: cervical ripening phase Preeclampsia:  n/a Fetal Wellbeing:  Category I Pain Control:  Fentanyl I/D:  n/a Anticipated MOD:  NSVD  Marge Duncans 11/08/2012, 12:20 AM

## 2012-11-08 NOTE — Progress Notes (Signed)
HA gets better when pt. Closes her eyes.

## 2012-11-08 NOTE — Progress Notes (Signed)
Pt. Crying d/t disagreement with her mother.  S.O. At [facility].  Asked pt. To remain calm and to rest.  She has stopped crying and waiting to hear from her sister.

## 2012-11-08 NOTE — Progress Notes (Signed)
LR with 40 units of pitocin infusing well at 999cc/hr on alaris pump  into left FA.  No s/s of infiltration at IV site.  No c/o pain at site.  Pt. With ice pack to perineum.  No c/o pain voiced at this time.

## 2012-11-08 NOTE — Progress Notes (Signed)
Called to see pt due to expulsion of 500cc of dark clots,  Vs reviewed and pt examined , with pelvic bimanual done BP 116/67  Pulse 107  Temp(Src) 98.8 F (37.1 C) (Oral)  Resp 18  Ht 5\' 7"  (1.702 m)  Wt 294 lb (133.358 kg)  BMI 46.04 kg/m2  SpO2 100%  LMP 12/12/2011  Breastfeeding? Unknown Pt voiding has been reduced. Bimanual notes another small old dark clot, 200cc, and no fresh blood. Uterine tone currently good. Fundus firm at u-1, A: mild postpartum hemmorhage(est [phone]),  Plan: restart IV pitocin, and add concurrent fluid bolus.  pitocin 40 u /l at 250  X 4 hrs  NS  At 250 / hr x 4 hrs CBC in am. Rn to call for additional clots

## 2012-11-08 NOTE — Anesthesia Preprocedure Evaluation (Signed)
Anesthesia Evaluation  Patient identified by MRN, date of birth, ID band Patient awake    Reviewed: Allergy & Precautions, H&P , Patient's Chart, lab work & pertinent test results  Airway Mallampati: III TM Distance: >3 FB Neck ROM: Full    Dental no notable dental hx. (+) Teeth Intact   Pulmonary neg pulmonary ROS,  breath sounds clear to auscultation  Pulmonary exam normal       Cardiovascular negative cardio ROS  Rhythm:Regular Rate:Normal     Neuro/Psych negative neurological ROS  negative psych ROS   GI/Hepatic negative GI ROS, Neg liver ROS,   Endo/Other  Morbid obesity  Renal/GU negative Renal ROS  negative genitourinary   Musculoskeletal negative musculoskeletal ROS (+)   Abdominal (+) + obese,   Peds  Hematology   Anesthesia Other Findings   Reproductive/Obstetrics HSV Genital Warts                           Anesthesia Physical Anesthesia Plan  ASA: III  Anesthesia Plan: Epidural   Post-op Pain Management:    Induction:   Airway Management Planned: Natural Airway  Additional Equipment:   Intra-op Plan:   Post-operative Plan:   Informed Consent: I have reviewed the patients History and Physical, chart, labs and discussed the procedure including the risks, benefits and alternatives for the proposed anesthesia with the patient or authorized representative who has indicated his/her understanding and acceptance.   Dental advisory given  Plan Discussed with: Anesthesiologist  Anesthesia Plan Comments:         Anesthesia Quick Evaluation

## 2012-11-08 NOTE — Progress Notes (Signed)
Discussed with Dr. Ena Dawley placement of IUPC.

## 2012-11-08 NOTE — Progress Notes (Signed)
ADVIKA MCLELLAND is a 22 y.o. G1P0 at [redacted]w[redacted]d admitted for induction of labor due to postdates.  Subjective: Comfortable w/ epidural, no complaints  Objective: BP 108/54  Pulse 73  Temp(Src) 98.6 F (37 C) (Oral)  Resp 18  Ht 5\' 7"  (1.702 m)  Wt 133.358 kg (294 lb)  BMI 46.04 kg/m2  SpO2 100%  LMP 12/12/2011      FHT:  FHR: 150 bpm, variability: moderate,  accelerations:  Present,  decelerations:  Present occ early UC:   regular, every 2-4 minutes SVE:   Dilation: 6 Effacement (%): 80 Station: -2 Exam by:: K.Verma Grothaus,CNM AROM small amount clear fluid  Labs: Lab Results  Component Value Date   WBC 10.4 11/07/2012   HGB 11.9* 11/07/2012   HCT 36.4 11/07/2012   MCV 82.4 11/07/2012   PLT 181 11/07/2012    Assessment / Plan: IOL d/t postdates, pitocin @ 28mu/min, now arom'd, RN to continue increasing pitocin per protocol as needed to achieve adequate labor/dilation  Labor: progressing slowly Preeclampsia:  n/a Fetal Wellbeing:  Category I Pain Control:  Epidural I/D:  n/a Anticipated MOD:  NSVD  Marge Duncans 11/08/2012, 6:26 AM

## 2012-11-08 NOTE — Progress Notes (Signed)
Patient ID: Brittney Nicholson, female   DOB: 08/02/1990, 22 y.o.   MRN: [id number]  S:  Pt comfortable with epidural. Feeling some pressure/pain in back  O:   Filed Vitals:   11/08/12 0730 11/08/12 0801 11/08/12 0832 11/08/12 0838  BP: 120/74 127/71 115/73 127/77  Pulse: 76 78 85 101  Temp:  98 F (36.7 C)    TempSrc:  Oral    Resp: 20 18 20    Height:      Weight:      SpO2:        CERV:  6-7/80/-2 IUPC placed without difficulty  FHTs:  Baseline: 145 Variability:  mod Accelerations:  present Decelerations:  Absent TOCO:  Not registering well   A/P 22 y.o. G1P0 at [redacted]w[redacted]d here for IOL for post-dates - minimal progress in last 2 hours - IUPC placed - Pit at 12 - continue to increase until ctx are adequate - Anticipate SVD Napoleon Form, MD

## 2012-11-09 LAB — CBC
MCH: 27.4 pg (ref 26.0–34.0)
MCHC: 32.9 g/dL (ref 30.0–36.0)
MCV: 83.4 fL (ref 78.0–100.0)
Platelets: 128 10*3/uL — ABNORMAL LOW (ref 150–400)
RBC: 3.32 MIL/uL — ABNORMAL LOW (ref 3.87–5.11)

## 2012-11-09 MED ORDER — FERROUS SULFATE 325 (65 FE) MG PO TABS
325.0000 mg | ORAL_TABLET | Freq: Two times a day (BID) | ORAL | Status: DC
  Administered 2012-11-09 – 2012-11-10 (×3): 325 mg via ORAL
  Filled 2012-11-09 (×3): qty 1

## 2012-11-09 NOTE — Lactation Note (Signed)
This note was copied from the chart of Brittney Nicholson. Lactation Consultation Note RN states mom has changed her mind and now wants to only formula feed baby. Informed RN to notify LC if mom wants to see lactation and attempt to breast feed.   Patient Name: Brittney Kaylynne Andres [id number] Date: 11/09/2012     Maternal Data    Feeding Feeding Type: Formula Feeding method: Bottle Nipple Type: Regular  LATCH Score/Interventions                      Lactation Tools Discussed/Used     Consult Status      Lenard Forth 11/09/2012, 9:53 AM

## 2012-11-09 NOTE — Progress Notes (Signed)
Post Partum Day 1 Subjective: up ad lib, voiding, tolerating PO and + flatus  Objective: Blood pressure 93/42, pulse 54, temperature 98 F (36.7 C), temperature source Axillary, resp. rate 20, height 5\' 7"  (1.702 m), weight 133.358 kg (294 lb), last menstrual period 12/12/2011, SpO2 99.00%, unknown if currently breastfeeding.  Physical Exam:  General: alert, cooperative and no distress Lochia: appropriate Uterine Fundus: firm Incision: healing well DVT Evaluation: No evidence of DVT seen on physical exam. Negative Homan's sign. No cords or calf tenderness. Calf/Ankle edema is present.   Recent Labs  11/07/12 1930 11/09/12 0620  HGB 11.9* 9.1*  HCT 36.4 27.7*    Assessment/Plan: Plan for discharge tomorrow and Contraception IUD.  Bottle feeding.   LOS: 2 days   Napoleon Form 11/09/2012, 9:27 AM

## 2012-11-09 NOTE — Progress Notes (Signed)
1610 11/09/2012 Called Midwife regarding pt's blood pressure. Bleeding WNL. Uterus Firm. RN took patient to bathroom pt stated ears were ringing.

## 2012-11-09 NOTE — Anesthesia Postprocedure Evaluation (Signed)
  Anesthesia Post-op Note  Patient: Brittney Nicholson  Procedure(s) Performed: * No procedures listed *  Patient Location: PACU and Mother/Baby  Anesthesia Type:Epidural  Level of Consciousness: awake, alert , oriented and patient cooperative  Airway and Oxygen Therapy: Patient Spontanous Breathing  Post-op Pain: none  Post-op Assessment: Post-op Vital signs reviewed, Patient's Cardiovascular Status Stable and Respiratory Function Stable  Post-op Vital Signs: Reviewed and stable  Complications: No apparent anesthesia complications

## 2012-11-10 MED ORDER — SENNOSIDES-DOCUSATE SODIUM 8.6-50 MG PO TABS: 2.0000 | ORAL_TABLET | Freq: Every day | ORAL | Status: DC

## 2012-11-10 MED ORDER — IBUPROFEN 600 MG PO TABS: 600.0000 mg | ORAL_TABLET | Freq: Four times a day (QID) | ORAL | Status: DC

## 2012-11-10 MED ORDER — FERROUS SULFATE 325 (65 FE) MG PO TABS: 325.0000 mg | ORAL_TABLET | Freq: Two times a day (BID) | ORAL | Status: DC

## 2012-11-10 MED ORDER — OXYCODONE-ACETAMINOPHEN 5-325 MG PO TABS: 1.0000 | ORAL_TABLET | ORAL | Status: DC | PRN

## 2012-11-10 NOTE — Discharge Summary (Signed)
Obstetric Discharge Summary Reason for Admission: induction of labor Prenatal Procedures: none Intrapartum Procedures: spontaneous vaginal delivery Postpartum Procedures: none Complications-Operative and Postpartum: 2nd degree perineal laceration Hemoglobin  Date Value Range Status  11/09/2012 9.1* 12.0 - 15.0 g/dL Final     DELTA CHECK NOTED     REPEATED TO VERIFY     HCT  Date Value Range Status  11/09/2012 27.7* 36.0 - 46.0 % Final    Physical Exam:  General: alert, cooperative and no distress Lochia: appropriate Uterine Fundus: firm Incision: healing well DVT Evaluation: No evidence of DVT seen on physical exam. Negative Homan's sign. No cords or calf tenderness. No significant calf/ankle edema.  Discharge Diagnoses: Term Pregnancy-delivered  Discharge Information: Date: 11/10/2012 Activity: pelvic rest Diet: routine Medications: PNV, Ibuprofen, Colace, Iron and Percocet Condition: stable Instructions: refer to practice specific booklet Discharge to: home Follow-up Information   Follow up with [facility] In 6 weeks. (For postpartum visit)    Contact information:   [address] [place] [address] [phone]      Newborn Data: Live born female  Birth Weight: 8 lb 14.3 oz (4034 g) APGAR: 9, 9  Home with mother.  Napoleon Form 11/10/2012, 7:22 AM

## 2012-11-11 ENCOUNTER — Ambulatory Visit (INDEPENDENT_AMBULATORY_CARE_PROVIDER_SITE_OTHER): Payer: Medicaid Other | Admitting: Obstetrics & Gynecology

## 2012-11-11 ENCOUNTER — Encounter: Payer: Self-pay | Admitting: Obstetrics & Gynecology

## 2012-11-11 VITALS — BP 102/80 | Ht 69.0 in | Wt 288.5 lb

## 2012-11-11 DIAGNOSIS — O99345 Other mental disorders complicating the puerperium: Secondary | ICD-10-CM

## 2012-11-11 MED ORDER — TRIAMTERENE-HCTZ 75-50 MG PO TABS: 1.0000 | ORAL_TABLET | Freq: Every day | ORAL | Status: DC

## 2012-11-11 MED ORDER — ESCITALOPRAM OXALATE 10 MG PO TABS: 20.0000 mg | ORAL_TABLET | Freq: Every day | ORAL | Status: DC

## 2012-11-11 NOTE — Progress Notes (Signed)
Patient ID: Brittney Nicholson, female   DOB: December 26, 1990, 22 y.o.   MRN: [id number] Patient with crying episodes since delivery, no suicidal thoughts or thoughts of hurting the baby Crying in office  Begin lexapro 20 mg daily, patient is large and will go ahead and start on lexapro 20 qd  Maxzide 1 a day for swelling

## 2012-11-18 NOTE — Discharge Summary (Signed)
Agree with above note.  Brittney Nicholson H. 11/18/2012 4:10 PM  

## 2012-11-27 ENCOUNTER — Encounter (HOSPITAL_COMMUNITY): Payer: Self-pay | Admitting: *Deleted

## 2012-11-27 ENCOUNTER — Emergency Department (HOSPITAL_COMMUNITY)
Admission: EM | Admit: 2012-11-27 | Discharge: 2012-11-27 | Discharge status: Against Medical Advice | Payer: BC Managed Care – PPO | Attending: Emergency Medicine | Admitting: Emergency Medicine

## 2012-11-27 DIAGNOSIS — R109 Unspecified abdominal pain: Secondary | ICD-10-CM | POA: Insufficient documentation

## 2012-11-27 LAB — COMPREHENSIVE METABOLIC PANEL
ALT: 21 U/L (ref 0–35)
Alkaline Phosphatase: 96 U/L (ref 39–117)
CO2: 29 mEq/L (ref 19–32)
Chloride: 101 mEq/L (ref 96–112)
GFR calc Af Amer: >90 mL/min (ref >90)
GFR calc non Af Amer: >90 mL/min (ref >90)
Glucose, Bld: 95 mg/dL (ref 70–99)
Potassium: 4.5 mEq/L (ref 3.5–5.1)
Sodium: 138 mEq/L (ref 135–145)
Total Bilirubin: 0.2 mg/dL — ABNORMAL LOW (ref 0.3–1.2)

## 2012-11-27 LAB — URINALYSIS, ROUTINE W REFLEX MICROSCOPIC
Bilirubin Urine: NEGATIVE
Glucose, UA: NEGATIVE mg/dL
Ketones, ur: NEGATIVE mg/dL
Nitrite: NEGATIVE
Protein, ur: NEGATIVE mg/dL
pH: 6.5 (ref 5.0–8.0)

## 2012-11-27 LAB — CBC WITH DIFFERENTIAL/PLATELET
Eosinophils Relative: 5 % (ref 0–5)
Lymphocytes Relative: 43 % (ref 12–46)
Lymphs Abs: 3 10*3/uL (ref 0.7–4.0)
MCV: 81 fL (ref 78.0–100.0)
Neutrophils Relative %: 44 % (ref 43–77)
Platelets: 233 10*3/uL (ref 150–400)
RBC: 4.62 MIL/uL (ref 3.87–5.11)
WBC: 7 10*3/uL (ref 4.0–10.5)

## 2012-11-27 NOTE — ED Notes (Signed)
Pt states that she got up with her baby (that is 69 weeks old) and fed her baby around 11:45. Pt states that she then had the pain in her abdomen. Pt states that the pain is severe and radiates around her right side to her flank and down her side to hip. Pt states still bleeding from post vaginal birth, no changes to amount of blood.

## 2012-12-04 ENCOUNTER — Encounter (HOSPITAL_COMMUNITY): Payer: Self-pay | Admitting: *Deleted

## 2012-12-15 ENCOUNTER — Ambulatory Visit: Payer: Medicaid Other | Admitting: Obstetrics & Gynecology

## 2013-01-24 ENCOUNTER — Observation Stay (HOSPITAL_COMMUNITY)
Admission: EM | Admit: 2013-01-24 | Discharge: 2013-01-25 | Disposition: A | Discharge status: Home / Self Care | Payer: BC Managed Care – PPO | Attending: General Surgery | Admitting: General Surgery

## 2013-01-24 ENCOUNTER — Encounter (HOSPITAL_COMMUNITY): Payer: Self-pay | Admitting: Emergency Medicine

## 2013-01-24 DIAGNOSIS — K8010 Calculus of gallbladder with chronic cholecystitis without obstruction: Principal | ICD-10-CM | POA: Insufficient documentation

## 2013-01-24 DIAGNOSIS — R109 Unspecified abdominal pain: Secondary | ICD-10-CM

## 2013-01-24 DIAGNOSIS — K810 Acute cholecystitis: Secondary | ICD-10-CM

## 2013-01-24 MED ORDER — MORPHINE SULFATE 4 MG/ML IJ SOLN
4.0000 mg | Freq: Once | INTRAMUSCULAR | Status: AC
  Administered 2013-01-25: 4 mg via INTRAVENOUS
  Filled 2013-01-24: qty 1

## 2013-01-24 MED ORDER — ONDANSETRON HCL 4 MG/2ML IJ SOLN
4.0000 mg | Freq: Once | INTRAMUSCULAR | Status: AC
  Administered 2013-01-24: 4 mg via INTRAVENOUS
  Filled 2013-01-24: qty 2

## 2013-01-24 MED ORDER — SODIUM CHLORIDE 0.9 % IV BOLUS (SEPSIS)
1000.0000 mL | Freq: Once | INTRAVENOUS | Status: AC
  Administered 2013-01-24: 1000 mL via INTRAVENOUS

## 2013-01-24 NOTE — ED Notes (Signed)
Reports this happened roughly a month ago as well only lasting an hour and not as severe. Went to [facility] ED at that time but left prior to being seen as the wait was too long.

## 2013-01-24 NOTE — ED Provider Notes (Signed)
CSN: [id number]     Arrival date & time 01/24/13  2313 History     First MD Initiated Contact with Patient 01/24/13 2340     Chief Complaint  Patient presents with  . Chest Pain   (Consider location/radiation/quality/duration/timing/severity/associated sxs/prior Treatment) HPI Comments: Brittney Nicholson is a 22 y.o. Female presenting with sudden onset of lower sternal sharp, stabbing pain occuring 3 hours before arrival in association with nausea and one episode of non bloody emesis.   Pain is constant and radiates into her mid back and is not worsened by movement or deep inspiration.  She denies shortness of breath, dizziness or weakness, no fevers or chills and was feeling well before this rather abrupt onset of symptoms.  She last ate tacos at 6 pm.  She is 2 months post partum; she is not breast feeding. She has found no alleviators,  Lying on her back worsens her pain as does palpating her upper abdomen.  She has had no medicines prior to arrival, but tried soaking in a hot tub of water which did not relieve her pain.  She had a similar episode 2 weeks post partum (June) which resolved before she was able to get evaluated.     The history is provided by the patient and the spouse.    Past Medical History  Diagnosis Date  . HSV-2 infection complicating pregnancy   . Warts    Past Surgical History  Procedure Laterality Date  . Tonsillectomy    . Breast lumpectomy  2006  . Tonsillectomy  2011  . Right arm Right     has pins.   Family History  Problem Relation Age of Onset  . Cancer Mother     uterine  . Diabetes Father   . Kidney failure Father    History  Substance Use Topics  . Smoking status: Never Smoker   . Smokeless tobacco: Never Used  . Alcohol Use: No   OB History   Grav Para Term Preterm Abortions TAB SAB Ect Mult Living   2 1 1       1      Review of Systems  Constitutional: Negative for fever and chills.  HENT: Negative for congestion, sore throat and  neck pain.   Eyes: Negative.   Respiratory: Negative for chest tightness and shortness of breath.   Cardiovascular: Positive for chest pain.  Gastrointestinal: Positive for nausea and vomiting. Negative for abdominal pain, diarrhea and constipation.  Genitourinary: Negative.   Musculoskeletal: Negative for joint swelling and arthralgias.  Skin: Negative.  Negative for rash and wound.  Neurological: Negative for dizziness, weakness, light-headedness, numbness and headaches.  Psychiatric/Behavioral: Negative.     Allergies  Gluten and Wheat  Home Medications   Current Outpatient Rx  Name  Route  Sig  Dispense  Refill  . escitalopram (LEXAPRO) 10 MG tablet   Oral   Take 2 tablets (20 mg total) by mouth daily.   30 tablet   3   . ferrous sulfate 325 (65 FE) MG tablet   Oral   Take 1 tablet (325 mg total) by mouth 2 (two) times daily with a meal.   60 tablet   3   . ibuprofen (ADVIL,MOTRIN) 600 MG tablet   Oral   Take 1 tablet (600 mg total) by mouth every 6 (six) hours.   30 tablet   1   . oxyCODONE-acetaminophen (PERCOCET/ROXICET) 5-325 MG per tablet   Oral   Take 1-2 tablets by mouth  every 4 (four) hours as needed.   20 tablet   0   . Prenatal Vit-Fe Fumarate-FA (PRENATAL MULTIVITAMIN) TABS   Oral   Take 1 tablet by mouth daily at 12 noon.         . senna-docusate (SENOKOT-S) 8.6-50 MG per tablet   Oral   Take 2 tablets by mouth at bedtime.   60 tablet   0   . triamterene-hydrochlorothiazide (MAXZIDE) 75-50 MG per tablet   Oral   Take 1 tablet by mouth daily.   7 tablet   0    BP 100/33  Pulse 65  Temp(Src) 98.3 F (36.8 C) (Oral)  Resp 17  Ht 5\' 9"  (1.753 m)  Wt 240 lb (108.863 kg)  BMI 35.43 kg/m2  SpO2 100% Physical Exam  Nursing note and vitals reviewed. Constitutional: She appears well-developed and well-nourished.  HENT:  Head: Normocephalic and atraumatic.  Eyes: Conjunctivae are normal.  Neck: Normal range of motion.  Cardiovascular:  Normal rate, regular rhythm, normal heart sounds and intact distal pulses.   Pulmonary/Chest: Effort normal and breath sounds normal. She has no wheezes.  Abdominal: Soft. Bowel sounds are normal. There is tenderness in the epigastric area. There is no guarding, no CVA tenderness and negative Murphy's sign.  Musculoskeletal: Normal range of motion.  Neurological: She is alert.  Skin: Skin is warm and dry.  Psychiatric: She has a normal mood and affect.    ED Course   Procedures (including critical care time)    Date: 01/25/2013  Rate: 70  Rhythm: normal sinus rhythm  QRS Axis: normal  Intervals: normal  ST/T Wave abnormalities: normal  Conduction Disutrbances:none  Narrative Interpretation:   Old EKG Reviewed: none available    Labs Reviewed  COMPREHENSIVE METABOLIC PANEL - Abnormal; Notable for the following:    AST 79 (*)    ALT 68 (*)    All other components within normal limits  URINALYSIS, ROUTINE W REFLEX MICROSCOPIC - Abnormal; Notable for the following:    Leukocytes, UA TRACE (*)    All other components within normal limits  URINE MICROSCOPIC-ADD ON - Abnormal; Notable for the following:    Squamous Epithelial / LPF MANY (*)    Bacteria, UA FEW (*)    All other components within normal limits  CBC WITH DIFFERENTIAL  LIPASE, BLOOD  POCT PREGNANCY, URINE   No results found. 1. Abdominal pain   2. Elevated LFTs     MDM  Pt with epigastric pain,  Elevated LFT's,  Suspicious for acute cholecystitis.  She was briefly pain free after receiving morphine 4 mg IV,  But pain is again escalating.  Discussed lab finding with patient.  Ct scan ordered for further evaluation of suspected gallbladder disease.  Dilaudid 1 mg IV ordered.   Discussed with Dr. Hyacinth Meeker who will follow patient.      Burgess Amor, PA-C 01/25/13 0139

## 2013-01-24 NOTE — ED Notes (Signed)
Pt c/o stabbing pain to under left breast x 3hrs with N/V. Denies dizziness, SOB, diaphoresis.

## 2013-01-24 NOTE — ED Notes (Signed)
Pt reports sharp stabbing pains under her left breast that has caused nausea & vomiting tonight.

## 2013-01-25 ENCOUNTER — Emergency Department (HOSPITAL_COMMUNITY): Payer: BC Managed Care – PPO

## 2013-01-25 ENCOUNTER — Encounter (HOSPITAL_COMMUNITY): Payer: Self-pay

## 2013-01-25 ENCOUNTER — Encounter (HOSPITAL_COMMUNITY): Payer: Self-pay | Admitting: Anesthesiology

## 2013-01-25 ENCOUNTER — Encounter (HOSPITAL_COMMUNITY)
Admission: EM | Disposition: A | Discharge status: Home / Self Care | Payer: Self-pay | Source: Home / Self Care | Attending: General Surgery

## 2013-01-25 ENCOUNTER — Observation Stay (HOSPITAL_COMMUNITY): Payer: BC Managed Care – PPO | Admitting: Anesthesiology

## 2013-01-25 ENCOUNTER — Inpatient Hospital Stay (HOSPITAL_COMMUNITY): Payer: BC Managed Care – PPO

## 2013-01-25 DIAGNOSIS — K810 Acute cholecystitis: Secondary | ICD-10-CM | POA: Diagnosis present

## 2013-01-25 HISTORY — PX: CHOLECYSTECTOMY: SHX55

## 2013-01-25 LAB — URINALYSIS, ROUTINE W REFLEX MICROSCOPIC
Glucose, UA: NEGATIVE mg/dL
Ketones, ur: NEGATIVE mg/dL
Nitrite: NEGATIVE
Protein, ur: NEGATIVE mg/dL
Urobilinogen, UA: 0.2 mg/dL (ref 0.0–1.0)

## 2013-01-25 LAB — CBC WITH DIFFERENTIAL/PLATELET
Eosinophils Relative: 5 % (ref 0–5)
HCT: 39.8 % (ref 36.0–46.0)
Hemoglobin: 12.8 g/dL (ref 12.0–15.0)
Lymphocytes Relative: 37 % (ref 12–46)
MCV: 81.1 fL (ref 78.0–100.0)
Monocytes Absolute: 0.5 10*3/uL (ref 0.1–1.0)
Monocytes Relative: 7 % (ref 3–12)
Neutro Abs: 3.4 10*3/uL (ref 1.7–7.7)
RDW: 13.8 % (ref 11.5–15.5)
WBC: 6.8 10*3/uL (ref 4.0–10.5)

## 2013-01-25 LAB — COMPREHENSIVE METABOLIC PANEL
BUN: 13 mg/dL (ref 6–23)
CO2: 29 mEq/L (ref 19–32)
Calcium: 9.8 mg/dL (ref 8.4–10.5)
Chloride: 100 mEq/L (ref 96–112)
Creatinine, Ser: 0.87 mg/dL (ref 0.50–1.10)
GFR calc Af Amer: >90 mL/min (ref >90)
GFR calc non Af Amer: >90 mL/min (ref >90)
Glucose, Bld: 94 mg/dL (ref 70–99)
Total Bilirubin: 0.4 mg/dL (ref 0.3–1.2)

## 2013-01-25 LAB — URINE MICROSCOPIC-ADD ON

## 2013-01-25 LAB — POCT PREGNANCY, URINE: Preg Test, Ur: NEGATIVE

## 2013-01-25 LAB — LIPASE, BLOOD: Lipase: 42 U/L (ref 11–59)

## 2013-01-25 SURGERY — LAPAROSCOPIC CHOLECYSTECTOMY
Anesthesia: General | Site: Abdomen | Wound class: Clean Contaminated

## 2013-01-25 MED ORDER — SODIUM CHLORIDE 0.9 % IR SOLN
Status: DC | PRN
  Administered 2013-01-25: 1000 mL

## 2013-01-25 MED ORDER — ONDANSETRON HCL 4 MG/2ML IJ SOLN
INTRAMUSCULAR | Status: AC
  Filled 2013-01-25: qty 2

## 2013-01-25 MED ORDER — GLYCOPYRROLATE 0.2 MG/ML IJ SOLN
INTRAMUSCULAR | Status: AC
  Filled 2013-01-25: qty 1

## 2013-01-25 MED ORDER — MIDAZOLAM HCL 2 MG/2ML IJ SOLN
1.0000 mg | INTRAMUSCULAR | Status: DC | PRN
  Administered 2013-01-25 (×2): 2 mg via INTRAVENOUS

## 2013-01-25 MED ORDER — MIDAZOLAM HCL 2 MG/2ML IJ SOLN
INTRAMUSCULAR | Status: AC
  Filled 2013-01-25: qty 2

## 2013-01-25 MED ORDER — SODIUM CHLORIDE 0.9 % IV SOLN
3.0000 g | Freq: Once | INTRAVENOUS | Status: AC
  Administered 2013-01-25: 3 g via INTRAVENOUS
  Filled 2013-01-25: qty 3

## 2013-01-25 MED ORDER — FENTANYL CITRATE 0.05 MG/ML IJ SOLN
INTRAMUSCULAR | Status: AC
  Filled 2013-01-25: qty 2

## 2013-01-25 MED ORDER — ONDANSETRON HCL 4 MG/2ML IJ SOLN: 4.0000 mg | Freq: Once | INTRAMUSCULAR | Status: DC | PRN

## 2013-01-25 MED ORDER — FENTANYL CITRATE 0.05 MG/ML IJ SOLN
INTRAMUSCULAR | Status: DC | PRN
  Administered 2013-01-25: 50 ug via INTRAVENOUS
  Administered 2013-01-25 (×2): 100 ug via INTRAVENOUS
  Administered 2013-01-25 (×2): 50 ug via INTRAVENOUS
  Administered 2013-01-25: 100 ug via INTRAVENOUS

## 2013-01-25 MED ORDER — LACTATED RINGERS IV SOLN
INTRAVENOUS | Status: DC
  Administered 2013-01-25 (×2): via INTRAVENOUS

## 2013-01-25 MED ORDER — HEMOSTATIC AGENTS (NO CHARGE) OPTIME
TOPICAL | Status: DC | PRN
  Administered 2013-01-25: 1 via TOPICAL

## 2013-01-25 MED ORDER — FENTANYL CITRATE 0.05 MG/ML IJ SOLN: 25.0000 ug | INTRAMUSCULAR | Status: DC | PRN

## 2013-01-25 MED ORDER — DIPHENHYDRAMINE HCL 12.5 MG/5ML PO ELIX: 12.5000 mg | ORAL_SOLUTION | Freq: Four times a day (QID) | ORAL | Status: DC | PRN

## 2013-01-25 MED ORDER — DEXAMETHASONE SODIUM PHOSPHATE 4 MG/ML IJ SOLN
INTRAMUSCULAR | Status: AC
  Filled 2013-01-25: qty 1

## 2013-01-25 MED ORDER — HYDROMORPHONE HCL PF 1 MG/ML IJ SOLN
1.0000 mg | INTRAMUSCULAR | Status: DC | PRN
  Administered 2013-01-25 (×2): 1 mg via INTRAVENOUS
  Filled 2013-01-25 (×2): qty 1

## 2013-01-25 MED ORDER — DEXAMETHASONE SODIUM PHOSPHATE 4 MG/ML IJ SOLN
4.0000 mg | Freq: Once | INTRAMUSCULAR | Status: AC
  Administered 2013-01-25: 4 mg via INTRAVENOUS

## 2013-01-25 MED ORDER — HYDROMORPHONE HCL PF 1 MG/ML IJ SOLN
1.0000 mg | Freq: Once | INTRAMUSCULAR | Status: AC
  Administered 2013-01-25: 1 mg via INTRAVENOUS
  Filled 2013-01-25: qty 1

## 2013-01-25 MED ORDER — HYDROMORPHONE HCL PF 1 MG/ML IJ SOLN: 1.0000 mg | INTRAMUSCULAR | Status: DC | PRN

## 2013-01-25 MED ORDER — KETOROLAC TROMETHAMINE 30 MG/ML IJ SOLN
30.0000 mg | Freq: Once | INTRAMUSCULAR | Status: AC
  Administered 2013-01-25: 30 mg via INTRAVENOUS

## 2013-01-25 MED ORDER — GLYCOPYRROLATE 0.2 MG/ML IJ SOLN
INTRAMUSCULAR | Status: DC | PRN
  Administered 2013-01-25: 0.6 mg via INTRAVENOUS

## 2013-01-25 MED ORDER — FENTANYL CITRATE 0.05 MG/ML IJ SOLN
INTRAMUSCULAR | Status: AC
  Filled 2013-01-25: qty 5

## 2013-01-25 MED ORDER — OXYCODONE-ACETAMINOPHEN 7.5-325 MG PO TABS: 1.0000 | ORAL_TABLET | ORAL | Status: DC | PRN

## 2013-01-25 MED ORDER — HYDROMORPHONE HCL PF 1 MG/ML IJ SOLN
1.0000 mg | INTRAMUSCULAR | Status: DC | PRN
  Administered 2013-01-25 (×5): 1 mg via INTRAVENOUS
  Filled 2013-01-25 (×4): qty 1

## 2013-01-25 MED ORDER — DIPHENHYDRAMINE HCL 50 MG/ML IJ SOLN: 12.5000 mg | Freq: Four times a day (QID) | INTRAMUSCULAR | Status: DC | PRN

## 2013-01-25 MED ORDER — ROCURONIUM BROMIDE 50 MG/5ML IV SOLN
INTRAVENOUS | Status: AC
  Filled 2013-01-25: qty 1

## 2013-01-25 MED ORDER — NEOSTIGMINE METHYLSULFATE 1 MG/ML IJ SOLN
INTRAMUSCULAR | Status: DC | PRN
  Administered 2013-01-25: 4 mg via INTRAVENOUS

## 2013-01-25 MED ORDER — LIDOCAINE HCL (CARDIAC) 10 MG/ML IV SOLN
INTRAVENOUS | Status: DC | PRN
  Administered 2013-01-25: 20 mg via INTRAVENOUS

## 2013-01-25 MED ORDER — HYDROMORPHONE HCL PF 1 MG/ML IJ SOLN
0.5000 mg | Freq: Once | INTRAMUSCULAR | Status: AC
  Administered 2013-01-25: 0.5 mg via INTRAVENOUS
  Filled 2013-01-25: qty 1

## 2013-01-25 MED ORDER — BUPIVACAINE HCL (PF) 0.5 % IJ SOLN
INTRAMUSCULAR | Status: DC | PRN
  Administered 2013-01-25: 10 mL

## 2013-01-25 MED ORDER — ENOXAPARIN SODIUM 40 MG/0.4ML ~~LOC~~ SOLN
40.0000 mg | SUBCUTANEOUS | Status: DC
  Administered 2013-01-25: 40 mg via SUBCUTANEOUS
  Filled 2013-01-25: qty 0.4

## 2013-01-25 MED ORDER — SODIUM CHLORIDE 0.9 % IV SOLN
3.0000 g | Freq: Four times a day (QID) | INTRAVENOUS | Status: DC
  Administered 2013-01-25 (×2): 3 g via INTRAVENOUS
  Filled 2013-01-25 (×5): qty 3

## 2013-01-25 MED ORDER — IOHEXOL 300 MG/ML  SOLN
100.0000 mL | Freq: Once | INTRAMUSCULAR | Status: AC | PRN
  Administered 2013-01-25: 100 mL via INTRAVENOUS

## 2013-01-25 MED ORDER — KETOROLAC TROMETHAMINE 30 MG/ML IJ SOLN
INTRAMUSCULAR | Status: AC
  Filled 2013-01-25: qty 1

## 2013-01-25 MED ORDER — PROPOFOL 10 MG/ML IV EMUL
INTRAVENOUS | Status: AC
  Filled 2013-01-25: qty 20

## 2013-01-25 MED ORDER — BUPIVACAINE HCL (PF) 0.5 % IJ SOLN
INTRAMUSCULAR | Status: AC
  Filled 2013-01-25: qty 30

## 2013-01-25 MED ORDER — SODIUM CHLORIDE 0.9 % IV SOLN: INTRAVENOUS | Status: DC

## 2013-01-25 MED ORDER — GLYCOPYRROLATE 0.2 MG/ML IJ SOLN
0.2000 mg | Freq: Once | INTRAMUSCULAR | Status: AC
  Administered 2013-01-25: 0.2 mg via INTRAVENOUS

## 2013-01-25 MED ORDER — ONDANSETRON HCL 4 MG/2ML IJ SOLN
4.0000 mg | Freq: Four times a day (QID) | INTRAMUSCULAR | Status: DC | PRN
  Administered 2013-01-25: 4 mg via INTRAVENOUS
  Filled 2013-01-25: qty 2

## 2013-01-25 MED ORDER — IOHEXOL 300 MG/ML  SOLN
50.0000 mL | Freq: Once | INTRAMUSCULAR | Status: AC | PRN
  Administered 2013-01-25: 50 mL via ORAL

## 2013-01-25 MED ORDER — PROPOFOL 10 MG/ML IV BOLUS
INTRAVENOUS | Status: DC | PRN
  Administered 2013-01-25: 150 mg via INTRAVENOUS

## 2013-01-25 MED ORDER — ONDANSETRON HCL 4 MG/2ML IJ SOLN: 4.0000 mg | Freq: Three times a day (TID) | INTRAMUSCULAR | Status: DC | PRN

## 2013-01-25 MED ORDER — ONDANSETRON HCL 4 MG/2ML IJ SOLN
4.0000 mg | Freq: Once | INTRAMUSCULAR | Status: AC
  Administered 2013-01-25: 4 mg via INTRAVENOUS

## 2013-01-25 MED ORDER — ROCURONIUM BROMIDE 100 MG/10ML IV SOLN
INTRAVENOUS | Status: DC | PRN
  Administered 2013-01-25: 30 mg via INTRAVENOUS
  Administered 2013-01-25: 5 mg via INTRAVENOUS

## 2013-01-25 SURGICAL SUPPLY — 42 items
APPLIER CLIP LAPSCP 10X32 DD (CLIP) ×2 IMPLANT
BAG HAMPER (MISCELLANEOUS) ×2 IMPLANT
BAG SPEC RTRVL LRG 6X4 10 (ENDOMECHANICALS) ×1
CLOTH BEACON ORANGE TIMEOUT ST (SAFETY) ×2 IMPLANT
COVER LIGHT HANDLE STERIS (MISCELLANEOUS) ×4 IMPLANT
CUTTER LINEAR ENDO 35 ETS TH (STAPLE) IMPLANT
DECANTER SPIKE VIAL GLASS SM (MISCELLANEOUS) ×2 IMPLANT
DURAPREP 26ML APPLICATOR (WOUND CARE) ×2 IMPLANT
ELECT REM PT RETURN 9FT ADLT (ELECTROSURGICAL) ×2
ELECTRODE REM PT RTRN 9FT ADLT (ELECTROSURGICAL) ×1 IMPLANT
FILTER SMOKE EVAC LAPAROSHD (FILTER) ×2 IMPLANT
FORMALIN 10 PREFIL 120ML (MISCELLANEOUS) ×2 IMPLANT
GLOVE BIO SURGEON STRL SZ7.5 (GLOVE) ×2 IMPLANT
GLOVE BIOGEL PI IND STRL 7.0 (GLOVE) IMPLANT
GLOVE BIOGEL PI INDICATOR 7.0 (GLOVE) ×2
GLOVE EXAM NITRILE MD LF STRL (GLOVE) ×1 IMPLANT
GLOVE OPTIFIT SS 6.5 STRL BRWN (GLOVE) ×2 IMPLANT
GOWN STRL REIN XL XLG (GOWN DISPOSABLE) ×7 IMPLANT
HEMOSTAT SNOW SURGICEL 2X4 (HEMOSTASIS) ×2 IMPLANT
INST SET LAPROSCOPIC AP (KITS) ×2 IMPLANT
IV NS IRRIG 3000ML ARTHROMATIC (IV SOLUTION) IMPLANT
KIT ROOM TURNOVER APOR (KITS) ×2 IMPLANT
MANIFOLD NEPTUNE II (INSTRUMENTS) ×2 IMPLANT
NDL INSUFFLATION 14GA 120MM (NEEDLE) ×1 IMPLANT
NEEDLE INSUFFLATION 14GA 120MM (NEEDLE) ×2 IMPLANT
NS IRRIG 1000ML POUR BTL (IV SOLUTION) ×2 IMPLANT
PACK LAP CHOLE LZT030E (CUSTOM PROCEDURE TRAY) ×2 IMPLANT
PAD ARMBOARD 7.5X6 YLW CONV (MISCELLANEOUS) ×2 IMPLANT
POUCH SPECIMEN RETRIEVAL 10MM (ENDOMECHANICALS) ×2 IMPLANT
SET BASIN LINEN APH (SET/KITS/TRAYS/PACK) ×2 IMPLANT
SET TUBE IRRIG SUCTION NO TIP (IRRIGATION / IRRIGATOR) IMPLANT
SLEEVE Z-THREAD 5X100MM (TROCAR) ×2 IMPLANT
SPONGE GAUZE 2X2 8PLY STRL LF (GAUZE/BANDAGES/DRESSINGS) ×8 IMPLANT
STAPLER VISISTAT (STAPLE) ×2 IMPLANT
SUT VICRYL 0 UR6 27IN ABS (SUTURE) ×2 IMPLANT
TAPE CLOTH SURG 4X10 WHT LF (GAUZE/BANDAGES/DRESSINGS) ×1 IMPLANT
TROCAR Z-THRD FIOS HNDL 11X100 (TROCAR) ×2 IMPLANT
TROCAR Z-THREAD FIOS 5X100MM (TROCAR) ×2 IMPLANT
TROCAR Z-THREAD SLEEVE 11X100 (TROCAR) ×2 IMPLANT
TUBING INSUFFLATION (TUBING) ×2 IMPLANT
WARMER LAPAROSCOPE (MISCELLANEOUS) ×2 IMPLANT
YANKAUER SUCT 12FT TUBE ARGYLE (SUCTIONS) ×2 IMPLANT

## 2013-01-25 NOTE — ED Notes (Signed)
Family at bedside. Patient sitting crossed legged in the bed crying. Asked patient if she would be able to provide a urine sample, she states that she is in so much pain that she would not be able to walk to restroom at this time.

## 2013-01-25 NOTE — Progress Notes (Signed)
Discharged instructions reviewed with patient and family, verbalized understanding, IV removed, all personal belongings gathered, tech escorted patient to front entrance via wheelchair.

## 2013-01-25 NOTE — Op Note (Signed)
Patient:  Brittney Nicholson  DOB:  31-Mar-1991  MRN:  [id number]   Preop Diagnosis:  Cholecystitis, cholelithiasis  Postop Diagnosis:  Same  Procedure:  Laparoscopic cholecystectomy  Surgeon:  Franky Macho, M.D.  Anes:  General endotracheal  Indications:  Patient is a 22 year old white female presents to the emergency room with cholecystitis secondary to cholelithiasis. Ultrasound confirmed cholelithiasis. The common bile duct was within normal limits. The risks and benefits of the procedure including bleeding, infection, hepatobiliary injury, and the possibility of an open procedure were fully explained to the patient, who gave informed consent.  Procedure note:  The patient is placed the supine position. After induction of general endotracheal anesthesia, the abdomen was prepped and draped using the usual sterile technique with DuraPrep. Surgical site confirmation was performed.  A supraumbilical incision was made down to the fascia. A Veress needle was introduced into the abdominal cavity and confirmation of placement was done using the saline drop test. The abdomen was then insufflated to 16 mm mercury pressure. An 11 mm trocar was introduced into the abdominal cavity under direct visualization without difficulty. The patient was placed in reverse Trendelenburg position and additional 11 mm trocar was placed the epigastric region and 5 mm trochars were placed the right upper quadrant and right flank regions. The liver was inspected and noted within normal limits. The gallbladder was retracted in a dynamic fashion in order to expose the triangle of Calot. The cystic duct was first identified. Its junction to the infundibulum was fully identified. Endoclips were placed proximally and distally on the cystic duct, and the cystic duct was divided. This was likewise done to the cystic artery. The gallbladder was then freed away from the gallbladder fossa using Bovie electrocautery. The gallbladder  was removed from the abdominal cavity using an Endo Catch bag. The gallbladder fossa was inspected and no abnormal bleeding or bile leakage was noted. Surgicel is placed the gallbladder fossa. All fluid and air were then evacuated from the abdominal cavity prior to removal of the trochars.  All wounds were gave normal saline. All wounds were injected with 0.5 sent Sensorcaine. The suprabuccal fashion as well as epigastric fascia reapproximated using 0 Vicryl interrupted sutures. All skin incisions were closed using staples. Betadine ointment and dry sterile dressings were applied.  All tape and needle counts were correct at the end of the procedure. Patient was extubated in the operating room and transferred to PACU in stable condition.  Complications:  None  EBL:  Minimal  Specimen:  Gallbladder

## 2013-01-25 NOTE — H&P (Signed)
Brittney Nicholson is an 22 y.o. female.   Chief Complaint: Abdominal pain HPI: Patient is a 22 year old white female status post delivery of her baby over one month ago who presents with her second episode of right upper quadrant abdominal pain and nausea. CT scan the abdomen revealed gallbladder wall thickening and stranding, consistent with cholecystitis. She underwent ultrasound the gallbladder which revealed cholelithiasis and normal common bile duct. She denies any fever, chills, or jaundice. She did not have significant problems with this during her pregnancy.  Past Medical History  Diagnosis Date  . HSV-2 infection complicating pregnancy   . Warts     Past Surgical History  Procedure Laterality Date  . Tonsillectomy    . Breast lumpectomy  2006  . Tonsillectomy  2011  . Right arm Right     has pins.    Family History  Problem Relation Age of Onset  . Cancer Mother     uterine  . Diabetes Father   . Kidney failure Father    Social History:  reports that she has never smoked. She has never used smokeless tobacco. She reports that she does not drink alcohol or use illicit drugs.  Allergies:  Allergies  Allergen Reactions  . Gluten Other (See Comments)    RECTAL BLEEDING  . Wheat Other (See Comments)    Rectal bleeding    Medications Prior to Admission  Medication Sig Dispense Refill  . escitalopram (LEXAPRO) 10 MG tablet Take 2 tablets (20 mg total) by mouth daily.  30 tablet  3  . ferrous sulfate 325 (65 FE) MG tablet Take 1 tablet (325 mg total) by mouth 2 (two) times daily with a meal.  60 tablet  3  . ibuprofen (ADVIL,MOTRIN) 600 MG tablet Take 1 tablet (600 mg total) by mouth every 6 (six) hours.  30 tablet  1  . oxyCODONE-acetaminophen (PERCOCET/ROXICET) 5-325 MG per tablet Take 1-2 tablets by mouth every 4 (four) hours as needed.  20 tablet  0  . Prenatal Vit-Fe Fumarate-FA (PRENATAL MULTIVITAMIN) TABS Take 1 tablet by mouth daily at 12 noon.      .  senna-docusate (SENOKOT-S) 8.6-50 MG per tablet Take 2 tablets by mouth at bedtime.  60 tablet  0  . triamterene-hydrochlorothiazide (MAXZIDE) 75-50 MG per tablet Take 1 tablet by mouth daily.  7 tablet  0    Results for orders placed during the hospital encounter of 01/24/13 (from the past 48 hour(s))  CBC WITH DIFFERENTIAL     Status: None   Collection Time    01/24/13 11:52 PM      Result Value Range   WBC 6.8  4.0 - 10.5 K/uL   RBC 4.91  3.87 - 5.11 MIL/uL   Hemoglobin 12.8  12.0 - 15.0 g/dL   HCT 84.1  32.4 - 40.1 %   MCV 81.1  78.0 - 100.0 fL   MCH 26.1  26.0 - 34.0 pg   MCHC 32.2  30.0 - 36.0 g/dL   RDW 02.7  25.3 - 66.4 %   Platelets 201  150 - 400 K/uL   Neutrophils Relative % 50  43 - 77 %   Neutro Abs 3.4  1.7 - 7.7 K/uL   Lymphocytes Relative 37  12 - 46 %   Lymphs Abs 2.5  0.7 - 4.0 K/uL   Monocytes Relative 7  3 - 12 %   Monocytes Absolute 0.5  0.1 - 1.0 K/uL   Eosinophils Relative 5  0 -  5 %   Eosinophils Absolute 0.3  0.0 - 0.7 K/uL   Basophils Relative 0  0 - 1 %   Basophils Absolute 0.0  0.0 - 0.1 K/uL  COMPREHENSIVE METABOLIC PANEL     Status: Abnormal   Collection Time    01/24/13 11:52 PM      Result Value Range   Sodium 138  135 - 145 mEq/L   Potassium 3.8  3.5 - 5.1 mEq/L   Chloride 100  96 - 112 mEq/L   CO2 29  19 - 32 mEq/L   Glucose, Bld 94  70 - 99 mg/dL   BUN 13  6 - 23 mg/dL   Creatinine, Ser 9.60  0.50 - 1.10 mg/dL   Calcium 9.8  8.4 - 45.4 mg/dL   Total Protein 7.5  6.0 - 8.3 g/dL   Albumin 4.3  3.5 - 5.2 g/dL   AST 79 (*) 0 - 37 U/L   ALT 68 (*) 0 - 35 U/L   Alkaline Phosphatase 89  39 - 117 U/L   Total Bilirubin 0.4  0.3 - 1.2 mg/dL   GFR calc non Af Amer >90  >90 mL/min   GFR calc Af Amer >90  >90 mL/min   Comment: (NOTE)     The eGFR has been calculated using the CKD EPI equation.     This calculation has not been validated in all clinical situations.     eGFR's persistently <90 mL/min signify possible Chronic Kidney     Disease.   LIPASE, BLOOD     Status: None   Collection Time    01/24/13 11:52 PM      Result Value Range   Lipase 42  11 - 59 U/L  URINALYSIS, ROUTINE W REFLEX MICROSCOPIC     Status: Abnormal   Collection Time    01/25/13 12:46 AM      Result Value Range   Color, Urine YELLOW  YELLOW   APPearance CLEAR  CLEAR   Specific Gravity, Urine 1.015  1.005 - 1.030   pH 7.5  5.0 - 8.0   Glucose, UA NEGATIVE  NEGATIVE mg/dL   Hgb urine dipstick NEGATIVE  NEGATIVE   Bilirubin Urine NEGATIVE  NEGATIVE   Ketones, ur NEGATIVE  NEGATIVE mg/dL   Protein, ur NEGATIVE  NEGATIVE mg/dL   Urobilinogen, UA 0.2  0.0 - 1.0 mg/dL   Nitrite NEGATIVE  NEGATIVE   Leukocytes, UA TRACE (*) NEGATIVE  URINE MICROSCOPIC-ADD ON     Status: Abnormal   Collection Time    01/25/13 12:46 AM      Result Value Range   Squamous Epithelial / LPF MANY (*) RARE   WBC, UA 0-2  <3 WBC/hpf   RBC / HPF 0-2  <3 RBC/hpf   Bacteria, UA FEW (*) RARE  POCT PREGNANCY, URINE     Status: None   Collection Time    01/25/13 12:50 AM      Result Value Range   Preg Test, Ur NEGATIVE  NEGATIVE   Comment:            THE SENSITIVITY OF THIS     METHODOLOGY IS >24 mIU/mL   Ct Abdomen Pelvis W Contrast  01/25/2013   *RADIOLOGY REPORT*  Clinical Data: 3 months postpartum.  Epigastric pain and elevated liver enzymes.  CT ABDOMEN AND PELVIS WITH CONTRAST  Technique:  Multidetector CT imaging of the abdomen and pelvis was performed following the standard protocol during bolus administration of intravenous contrast.  Contrast: 50mL OMNIPAQUE IOHEXOL 300 MG/ML  SOLN, OMNIPAQUE IOHEXOL 300 MG/ML  SOLN  Comparison: None.  Findings: Minimal dependent changes in the lung bases.  The gallbladder wall is thickened and edematous.  No discrete stones are visualized but CT is sometimes not sensitive for detection of stones in the gallbladder.  There is mild intrahepatic bile duct dilatation without extrahepatic ductal dilatation. Consider cholecystitis or  inflammatory process.  No focal liver lesions are demonstrated.  Spleen is normal.  The pancreas, adrenal glands, kidneys, abdominal aorta, retroperitoneal lymph nodes, and inferior vena cava are unremarkable.  The stomach wall is not thickened.  No small bowel distension.  Gas and stool in the colon without distension.  No free air or free fluid in the abdomen. Abdominal wall musculature appears intact.  Pelvis:  Uterus appears retroflexed.  Uterus and ovaries are not enlarged.  Bladder wall is not thickened.  No free or loculated pelvic fluid collections.  No significant pelvic lymphadenopathy. The appendix is normal.  No inflammatory change in the rectosigmoid colon.  Normal alignment of the lumbar vertebrae.  No destructive bone lesions appreciated.  IMPRESSION: Probable inflammatory changes in the gallbladder with mild intrahepatic bile duct dilatation.  No gall stones visualized.   Original Report Authenticated By: Burman Nieves, M.D.   US Abdomen Limited Ruq  01/25/2013   *RADIOLOGY REPORT*  Clinical Data:  Right upper quadrant abdominal pain  GALLBLADDER ULTRASOUND  Comparison:  01/25/2013  Findings:  Gallbladder:  Multiple tiny stones are identified within the lumen of the gallbladder.  The gallbladder wall is upper limits of normal measuring 2.6 mm in thickness.  Negative sonographic Murphy's sign.  Common Bile Duct:  Within normal limits in caliber.  Liver:  No focal lesion identified.  Diffuse increased echogenicity of the liver parenchyma noted.  IMPRESSION:  1.  Gallstones. 2.  Fatty infiltration of the liver.   Original Report Authenticated By: Signa Kell, M.D.    Review of Systems  Constitutional: Positive for malaise/fatigue.  HENT: Negative.   Respiratory: Negative.   Cardiovascular: Negative.   Gastrointestinal: Positive for nausea and abdominal pain.  Skin: Negative.   All other systems reviewed and are negative.    Blood pressure 106/70, pulse 66, temperature 98.4 F (36.9  C), temperature source Oral, resp. rate 16, height 5\' 9"  (1.753 m), weight 109.317 kg (241 lb), SpO2 99.00%, not currently breastfeeding. Physical Exam  Vitals reviewed. Constitutional: She is oriented to person, place, and time. She appears well-developed and well-nourished.  HENT:  Head: Normocephalic and atraumatic.  Eyes: No scleral icterus.  Neck: Normal range of motion. Neck supple.  Cardiovascular: Normal rate, regular rhythm and normal heart sounds.   Respiratory: Effort normal and breath sounds normal.  GI: Soft. She exhibits no distension. There is tenderness. There is no guarding.  Tender in the right upper quadrant to deep palpation. No rigidity noted.  Neurological: She is alert and oriented to person, place, and time.  Skin: Skin is warm and dry.     Assessment/Plan Impression: Cholecystitis, cholelithiasis Plan: Patient will undergo laparoscopic cholecystectomy today. The risks and benefits of the procedure including bleeding, infection, hepatobiliary injury, and the possibility of an open procedure were fully explained to the patient, who gave informed consent.  Diahn Waidelich A 01/25/2013, 8:57 AM

## 2013-01-25 NOTE — Anesthesia Preprocedure Evaluation (Signed)
Anesthesia Evaluation  Patient identified by MRN, date of birth, ID band Patient awake    Reviewed: Allergy & Precautions, H&P , NPO status , Patient's Chart, lab work & pertinent test results  Airway Mallampati: II TM Distance: >3 FB     Dental  (+) Teeth Intact   Pulmonary neg pulmonary ROS,  breath sounds clear to auscultation        Cardiovascular negative cardio ROS  Rhythm:Regular Rate:Normal     Neuro/Psych    GI/Hepatic N/V, RUQ pain today   Endo/Other    Renal/GU      Musculoskeletal   Abdominal   Peds  Hematology   Anesthesia Other Findings   Reproductive/Obstetrics                           Anesthesia Physical Anesthesia Plan  ASA: I  Anesthesia Plan: General   Post-op Pain Management:    Induction: Intravenous, Rapid sequence and Cricoid pressure planned  Airway Management Planned: Oral ETT  Additional Equipment:   Intra-op Plan:   Post-operative Plan: Extubation in OR  Informed Consent: I have reviewed the patients History and Physical, chart, labs and discussed the procedure including the risks, benefits and alternatives for the proposed anesthesia with the patient or authorized representative who has indicated his/her understanding and acceptance.     Plan Discussed with:   Anesthesia Plan Comments:         Anesthesia Quick Evaluation

## 2013-01-25 NOTE — Transfer of Care (Signed)
Immediate Anesthesia Transfer of Care Note  Patient: Brittney Nicholson  Procedure(s) Performed: Procedure(s): LAPAROSCOPIC CHOLECYSTECTOMY (N/A)  Patient Location: PACU  Anesthesia Type:General  Level of Consciousness: awake, oriented and patient cooperative  Airway & Oxygen Therapy: Patient Spontanous Breathing and Patient connected to face mask oxygen  Post-op Assessment: Report given to PACU RN and Post -op Vital signs reviewed and stable  Post vital signs: Reviewed and stable  Complications: No apparent anesthesia complications

## 2013-01-25 NOTE — Progress Notes (Signed)
UR chart review completed.  

## 2013-01-25 NOTE — ED Provider Notes (Signed)
22 year old female with abdominal pain after eating this evening. On my exam she has upper abdominal pain including no Murphy sign, epigastric tenderness with guarding. She is soft abdomen, obese, otherwise hemodynamically stable with clear heart and lung sounds. I have reviewed her blood work including a mild transaminitis as well as a normal white blood cell count. Her CT scan shows findings consistent with a possible acute cholecystitis and given her ongoing pain and the location of her tenderness I will make this assumption and admit her to the surgical service. This was discussed with Dr. Lovell Sheehan who is in agreement with the plan. The patient is n.p.o., antibiotics ordered.  Medical screening examination/treatment/procedure(s) were conducted as a shared visit with non-physician practitioner(s) and myself.  I personally evaluated the patient during the encounter    Vida Roller, MD 01/25/13 [phone]

## 2013-01-25 NOTE — Anesthesia Postprocedure Evaluation (Signed)
  Anesthesia Post-op Note  Patient: Brittney Nicholson  Procedure(s) Performed: Procedure(s): LAPAROSCOPIC CHOLECYSTECTOMY (N/A)  Patient Location: PACU  Anesthesia Type:General  Level of Consciousness: patient cooperative  Airway and Oxygen Therapy: Patient Spontanous Breathing and Patient connected to face mask oxygen  Post-op Pain: none  Post-op Assessment: Post-op Vital signs reviewed, Patient's Cardiovascular Status Stable, Respiratory Function Stable, Patent Airway, No signs of Nausea or vomiting and Pain level controlled  Post-op Vital Signs: Reviewed and stable  Complications: No apparent anesthesia complications

## 2013-01-25 NOTE — Anesthesia Procedure Notes (Signed)
Procedure Name: Intubation Date/Time: 01/25/2013 3:24 PM Performed by: Franco Nones Pre-anesthesia Checklist: Patient identified, Patient being monitored, Timeout performed, Emergency Drugs available and Suction available Patient Re-evaluated:Patient Re-evaluated prior to inductionOxygen Delivery Method: Circle System Utilized Preoxygenation: Pre-oxygenation with 100% oxygen Intubation Type: IV induction, Rapid sequence and Cricoid Pressure applied Ventilation: Mask ventilation without difficulty Laryngoscope Size: Miller and 2 Grade View: Grade I Tube type: Oral Tube size: 7.0 mm Number of attempts: 1 Airway Equipment and Method: stylet Placement Confirmation: ETT inserted through vocal cords under direct vision,  positive ETCO2 and breath sounds checked- equal and bilateral Secured at: 21 cm Tube secured with: Tape Dental Injury: Teeth and Oropharynx as per pre-operative assessment

## 2013-01-27 ENCOUNTER — Encounter (HOSPITAL_COMMUNITY): Payer: Self-pay | Admitting: General Surgery

## 2013-01-27 NOTE — Discharge Summary (Signed)
Physician Discharge Summary  Patient ID: CHRISTINIA LAMBETH MRN: [id number] DOB/AGE: 01-12-1991 22 y.o.  Admit date: 01/25/13 Discharge date: 01/25/13 Admission Diagnoses: Cholecystitis, cholelithiasis  Discharge Diagnoses: Same Active Problems:   Acute cholecystitis   Discharged Condition: good  Hospital Course: Patient is a 22 year old white female who is over 1 month postpartum who presents with a several-day history of worsening right upper quadrant abdominal pain, nausea, vomiting. Ultrasound of the gallbladder reveals cholelithiasis. Her common bile duct is within normal limits. She was taken to the operating room on 01/25/2013 underwent laparoscopic cholecystectomy. She tolerated the procedure well. Her postoperative course was unremarkable. Her diet was advanced at difficulty. The patient is being discharged home on 01/25/2013 in good improving condition.  Treatments: surgery: Laparoscopic cholecystectomy on 01/25/2013  Discharge Exam: Blood pressure 117/56, pulse 53, temperature 98.4 F (36.9 C), temperature source Oral, resp. rate 18, height 5\' 9"  (1.753 m), weight 109.317 kg (241 lb), SpO2 95.00%, not currently breastfeeding. General appearance: alert and cooperative Resp: clear to auscultation bilaterally Cardio: regular rate and rhythm, S1, S2 normal, no murmur, click, rub or gallop GI: Soft. Dressings dry and intact.  Disposition: 01-Home or Self Care     Medication List    STOP taking these medications       BC FAST PAIN RELIEF [phone] MG Pack  Generic drug:  Aspirin-Salicylamide-Caffeine      TAKE these medications       escitalopram 10 MG tablet  Commonly known as:  LEXAPRO  Take 2 tablets (20 mg total) by mouth daily.     oxyCODONE-acetaminophen 7.5-325 MG per tablet  Commonly known as:  PERCOCET  Take 1-2 tablets by mouth every 4 (four) hours as needed for pain.           Follow-up Information   Follow up with Dalia Heading, MD. Schedule an  appointment as soon as possible for a visit on 02/02/2013.   Specialty:  General Surgery   Contact information:   [address] [place] [address] [phone]       Signed: Franky Macho A 01/27/2013, 1:20 PM

## 2013-01-30 ENCOUNTER — Encounter (HOSPITAL_COMMUNITY): Payer: Self-pay | Admitting: *Deleted

## 2013-01-30 ENCOUNTER — Emergency Department (HOSPITAL_COMMUNITY)
Admission: EM | Admit: 2013-01-30 | Discharge: 2013-01-30 | Disposition: A | Discharge status: Home / Self Care | Payer: BC Managed Care – PPO | Attending: Emergency Medicine | Admitting: Emergency Medicine

## 2013-01-30 ENCOUNTER — Emergency Department (HOSPITAL_COMMUNITY): Payer: BC Managed Care – PPO

## 2013-01-30 DIAGNOSIS — R109 Unspecified abdominal pain: Secondary | ICD-10-CM | POA: Insufficient documentation

## 2013-01-30 DIAGNOSIS — Z79899 Other long term (current) drug therapy: Secondary | ICD-10-CM | POA: Insufficient documentation

## 2013-01-30 DIAGNOSIS — Z8619 Personal history of other infectious and parasitic diseases: Secondary | ICD-10-CM | POA: Insufficient documentation

## 2013-01-30 DIAGNOSIS — Z8742 Personal history of other diseases of the female genital tract: Secondary | ICD-10-CM | POA: Insufficient documentation

## 2013-01-30 DIAGNOSIS — R51 Headache: Secondary | ICD-10-CM | POA: Insufficient documentation

## 2013-01-30 DIAGNOSIS — G8918 Other acute postprocedural pain: Secondary | ICD-10-CM

## 2013-01-30 DIAGNOSIS — Z3202 Encounter for pregnancy test, result negative: Secondary | ICD-10-CM | POA: Insufficient documentation

## 2013-01-30 DIAGNOSIS — R110 Nausea: Secondary | ICD-10-CM

## 2013-01-30 LAB — URINALYSIS, ROUTINE W REFLEX MICROSCOPIC
Glucose, UA: NEGATIVE mg/dL
Nitrite: NEGATIVE
Specific Gravity, Urine: 1.025 (ref 1.005–1.030)
pH: 6.5 (ref 5.0–8.0)

## 2013-01-30 LAB — LIPASE, BLOOD: Lipase: 138 U/L — ABNORMAL HIGH (ref 11–59)

## 2013-01-30 LAB — CBC WITH DIFFERENTIAL/PLATELET
Basophils Absolute: 0 10*3/uL (ref 0.0–0.1)
Basophils Relative: 0 % (ref 0–1)
Eosinophils Absolute: 0.5 10*3/uL (ref 0.0–0.7)
HCT: 40.7 % (ref 36.0–46.0)
MCH: 26.3 pg (ref 26.0–34.0)
MCHC: 32.4 g/dL (ref 30.0–36.0)
Monocytes Absolute: 0.6 10*3/uL (ref 0.1–1.0)
Neutro Abs: 3.6 10*3/uL (ref 1.7–7.7)
Neutrophils Relative %: 51 % (ref 43–77)
RDW: 14 % (ref 11.5–15.5)

## 2013-01-30 LAB — URINE MICROSCOPIC-ADD ON

## 2013-01-30 LAB — COMPREHENSIVE METABOLIC PANEL
AST: 28 U/L (ref 0–37)
Albumin: 4 g/dL (ref 3.5–5.2)
Chloride: 100 mEq/L (ref 96–112)
Creatinine, Ser: 0.94 mg/dL (ref 0.50–1.10)
Total Bilirubin: 0.2 mg/dL — ABNORMAL LOW (ref 0.3–1.2)
Total Protein: 7.6 g/dL (ref 6.0–8.3)

## 2013-01-30 LAB — POCT PREGNANCY, URINE: Preg Test, Ur: NEGATIVE

## 2013-01-30 MED ORDER — SODIUM CHLORIDE 0.9 % IV BOLUS (SEPSIS)
1000.0000 mL | Freq: Once | INTRAVENOUS | Status: AC
  Administered 2013-01-30: 1000 mL via INTRAVENOUS

## 2013-01-30 MED ORDER — HYDROCODONE-ACETAMINOPHEN 5-325 MG PO TABS: 2.0000 | ORAL_TABLET | ORAL | Status: DC | PRN

## 2013-01-30 MED ORDER — IOHEXOL 300 MG/ML  SOLN
50.0000 mL | Freq: Once | INTRAMUSCULAR | Status: AC | PRN
  Administered 2013-01-30: 50 mL via ORAL

## 2013-01-30 MED ORDER — ONDANSETRON 8 MG PO TBDP: 8.0000 mg | ORAL_TABLET | Freq: Three times a day (TID) | ORAL | Status: DC | PRN

## 2013-01-30 MED ORDER — HYDROCODONE-ACETAMINOPHEN 5-325 MG PO TABS: 1.0000 | ORAL_TABLET | Freq: Once | ORAL | Status: DC

## 2013-01-30 MED ORDER — IOHEXOL 300 MG/ML  SOLN
100.0000 mL | Freq: Once | INTRAMUSCULAR | Status: AC | PRN
  Administered 2013-01-30: 100 mL via INTRAVENOUS

## 2013-01-30 MED ORDER — HYDROMORPHONE HCL PF 1 MG/ML IJ SOLN
1.0000 mg | Freq: Once | INTRAMUSCULAR | Status: AC
  Administered 2013-01-30: 1 mg via INTRAVENOUS
  Filled 2013-01-30: qty 1

## 2013-01-30 MED ORDER — HYDROCODONE-ACETAMINOPHEN 5-325 MG PO TABS
1.0000 | ORAL_TABLET | Freq: Once | ORAL | Status: AC
  Administered 2013-01-30: 1 via ORAL
  Filled 2013-01-30: qty 1

## 2013-01-30 MED ORDER — ONDANSETRON HCL 4 MG/2ML IJ SOLN
4.0000 mg | Freq: Once | INTRAMUSCULAR | Status: AC
  Administered 2013-01-30: 4 mg via INTRAVENOUS
  Filled 2013-01-30: qty 2

## 2013-01-30 NOTE — ED Notes (Signed)
MD aware of BP prior to discharge.

## 2013-01-30 NOTE — ED Notes (Signed)
Patient with no complaints at this time. Respirations even and unlabored. Skin warm/dry. Discharge instructions reviewed with patient at this time. Patient given opportunity to voice concerns/ask questions. IV removed per policy and band-aid applied to site. Patient discharged at this time and left Emergency Department with steady gait.  

## 2013-01-30 NOTE — Discharge Instructions (Signed)
Please return if you are worse especially fever, worsening pain, or unable to keep fluids down.  Recheck with Dr. Lovell Sheehan on Tuesday.

## 2013-01-30 NOTE — ED Notes (Signed)
Pt c/o abd pain around umbilicalis area, nausea, had gallbladder surgery 01/24/2013, was discharged after surgery.

## 2013-01-30 NOTE — ED Provider Notes (Addendum)
CSN: [id number]     Arrival date & time 01/30/13  1407 History  This chart was scribed for Hilario Quarry, MD by Ardelia Mems, ED Scribe. This patient was seen in room [facility] and the patient's care was started at 2:12 PM.    Chief Complaint  Patient presents with  . Abdominal Pain  . Nausea    Patient is a 22 y.o. female presenting with abdominal pain. The history is provided by the patient. No language interpreter was used.  Abdominal Pain Pain location:  Periumbilical and RUQ Pain radiates to:  Does not radiate Pain severity:  Moderate Onset quality:  Gradual Duration:  4 days Timing:  Constant Progression:  Worsening Chronicity:  New Context: previous surgery (cholecystectomy 5 days ago)   Relieved by:  Nothing Worsened by:  Nothing tried Ineffective treatments:  None tried Associated symptoms: nausea   Associated symptoms: no constipation, no diarrhea, no dysuria, no fever and no vomiting    HPI Comments: Brittney Nicholson is a 22 y.o. female who presents to the Emergency Department complaining of constant, moderate "7/10" RUQ and periumbilical abdominal pain over the past 4 days with associated nausea. Pt states that she had cholecystectomy surgery 5 days ago. She states that she was discharged with Percocet, and states that "I did not like the way it made me feel". She also reports having a mild generalized headache. She states that she has no other chronic medical conditions and that she takes no medications on a daily basis. She states that she has been having normal BMs and has not been having excessive flatulence. She denies frequency, dysuria, fever, vomiting or any other symptoms. She states that she is not on birth control. She denies any history of smoking and denies alcohol use.  PCP- Dr. Burnett Kanaris   Past Medical History  Diagnosis Date  . HSV-2 infection complicating pregnancy   . Warts    Past Surgical History  Procedure Laterality Date  .  Tonsillectomy    . Breast lumpectomy  2006  . Tonsillectomy  2011  . Right arm Right     has pins.  . Cholecystectomy N/A 01/25/2013    Procedure: LAPAROSCOPIC CHOLECYSTECTOMY;  Surgeon: Dalia Heading, MD;  Location: [facility];  Service: General;  Laterality: N/A;   Family History  Problem Relation Age of Onset  . Cancer Mother     uterine  . Diabetes Father   . Kidney failure Father    History  Substance Use Topics  . Smoking status: Never Smoker   . Smokeless tobacco: Never Used  . Alcohol Use: No   OB History   Grav Para Term Preterm Abortions TAB SAB Ect Mult Living   2 1 1       1      Review of Systems  Constitutional: Negative for fever.  Gastrointestinal: Positive for nausea and abdominal pain. Negative for vomiting, diarrhea and constipation.  Genitourinary: Negative for dysuria and frequency.  Neurological: Positive for headaches.  All other systems reviewed and are negative.    Allergies  Gluten and Wheat  Home Medications   Current Outpatient Rx  Name  Route  Sig  Dispense  Refill  . escitalopram (LEXAPRO) 10 MG tablet   Oral   Take 2 tablets (20 mg total) by mouth daily.   30 tablet   3   . oxyCODONE-acetaminophen (PERCOCET) 7.5-325 MG per tablet   Oral   Take 1-2 tablets by mouth every 4 (four) hours as  needed for pain.   50 tablet   0    Triage Vitals: BP 107/53  Pulse 70  Temp(Src) 99.1 F (37.3 C) (Oral)  Resp 20  SpO2 100%  Physical Exam  Nursing note and vitals reviewed. Constitutional: She is oriented to person, place, and time. She appears well-developed and well-nourished.  HENT:  Head: Normocephalic and atraumatic.  Eyes: Conjunctivae and EOM are normal.  Neck: Normal range of motion. Neck supple. No tracheal deviation present.  Cardiovascular: Normal rate, regular rhythm and normal heart sounds.   Pulmonary/Chest: Effort normal and breath sounds normal. No respiratory distress.  Abdominal: Soft. Bowel sounds are normal. There  is tenderness (Tenderness to palpation  in RUQ; mild periumbilical tenderness to palpation).  Musculoskeletal: Normal range of motion. She exhibits no tenderness.  Neurological: She is alert and oriented to person, place, and time.  Skin: Skin is warm and dry. No rash noted.  Psychiatric: She has a normal mood and affect.    ED Course   Medications  HYDROmorphone (DILAUDID) injection 1 mg (not administered)  ondansetron (ZOFRAN) injection 4 mg (not administered)  sodium chloride 0.9 % bolus 1,000 mL (not administered)  iohexol (OMNIPAQUE) 300 MG/ML solution 50 mL (50 mLs Oral Contrast Given 01/30/13 1440)   Procedures (including critical care time)  DIAGNOSTIC STUDIES: Oxygen Saturation is q100% on RA, normal by my interpretation.    COORDINATION OF CARE: 2:20 PM- Pt advised of plan for diagnostic lab work and radiology, along with plan to receive medications in the ED and pt agrees.   Labs Reviewed  CBC WITH DIFFERENTIAL - Abnormal; Notable for the following:    Eosinophils Relative 7 (*)    All other components within normal limits  COMPREHENSIVE METABOLIC PANEL  LIPASE, BLOOD  URINALYSIS, ROUTINE W REFLEX MICROSCOPIC  POCT PREGNANCY, URINE    No results found.  No diagnosis found.  MDM  Patient feels well drank all of contrast, no pain or vomiting.  States the percocet gives her a headache.  She does have an elevated lipase but suspect this may be due to instrumentalization vs cbd as patient has been taking po without increased pain.  Plan change to vicodin and rx for zofran.  Patient given return precautions and to follow up with Dr. Lovell Sheehan Tuesday.  Discussed with Dr. Leticia Penna and agrees with plan.    Hilario Quarry, MD 01/30/13 1636 I personally performed the services described in this documentation, which was scribed in my presence. The recorded information has been reviewed and considered.   Hilario Quarry, MD 02/01/13 [phone]

## 2013-01-31 LAB — URINE CULTURE

## 2013-02-14 ENCOUNTER — Emergency Department (HOSPITAL_COMMUNITY)
Admission: EM | Admit: 2013-02-14 | Discharge: 2013-02-14 | Disposition: A | Discharge status: Home / Self Care | Payer: BC Managed Care – PPO | Attending: Emergency Medicine | Admitting: Emergency Medicine

## 2013-02-14 ENCOUNTER — Encounter (HOSPITAL_COMMUNITY): Payer: Self-pay

## 2013-02-14 DIAGNOSIS — Z3202 Encounter for pregnancy test, result negative: Secondary | ICD-10-CM | POA: Insufficient documentation

## 2013-02-14 DIAGNOSIS — Z9089 Acquired absence of other organs: Secondary | ICD-10-CM | POA: Insufficient documentation

## 2013-02-14 DIAGNOSIS — Z8619 Personal history of other infectious and parasitic diseases: Secondary | ICD-10-CM | POA: Insufficient documentation

## 2013-02-14 DIAGNOSIS — R1013 Epigastric pain: Secondary | ICD-10-CM

## 2013-02-14 LAB — URINE MICROSCOPIC-ADD ON

## 2013-02-14 LAB — CBC WITH DIFFERENTIAL/PLATELET
Basophils Relative: 1 % (ref 0–1)
Eosinophils Absolute: 0.5 10*3/uL (ref 0.0–0.7)
Hemoglobin: 12.5 g/dL (ref 12.0–15.0)
Lymphs Abs: 2 10*3/uL (ref 0.7–4.0)
MCH: 26.5 pg (ref 26.0–34.0)
Monocytes Relative: 7 % (ref 3–12)
Neutro Abs: 3.4 10*3/uL (ref 1.7–7.7)
Neutrophils Relative %: 54 % (ref 43–77)
Platelets: 193 10*3/uL (ref 150–400)
RBC: 4.72 MIL/uL (ref 3.87–5.11)

## 2013-02-14 LAB — COMPREHENSIVE METABOLIC PANEL
ALT: 28 U/L (ref 0–35)
Albumin: 3.7 g/dL (ref 3.5–5.2)
Alkaline Phosphatase: 69 U/L (ref 39–117)
BUN: 15 mg/dL (ref 6–23)
Chloride: 102 mEq/L (ref 96–112)
Glucose, Bld: 121 mg/dL — ABNORMAL HIGH (ref 70–99)
Potassium: 4 mEq/L (ref 3.5–5.1)
Sodium: 137 mEq/L (ref 135–145)
Total Bilirubin: <0.1 mg/dL — ABNORMAL LOW (ref 0.3–1.2)
Total Protein: 6.7 g/dL (ref 6.0–8.3)

## 2013-02-14 LAB — URINALYSIS, ROUTINE W REFLEX MICROSCOPIC
Bilirubin Urine: NEGATIVE
Hgb urine dipstick: NEGATIVE
Specific Gravity, Urine: >1.03 — ABNORMAL HIGH (ref 1.005–1.030)
pH: 6 (ref 5.0–8.0)

## 2013-02-14 LAB — LIPASE, BLOOD: Lipase: 58 U/L (ref 11–59)

## 2013-02-14 MED ORDER — HYDROCODONE-ACETAMINOPHEN 5-325 MG PO TABS: 2.0000 | ORAL_TABLET | ORAL | Status: DC | PRN

## 2013-02-14 MED ORDER — TRAMADOL HCL 50 MG PO TABS
50.0000 mg | ORAL_TABLET | Freq: Once | ORAL | Status: AC
  Administered 2013-02-14: 50 mg via ORAL
  Filled 2013-02-14: qty 1

## 2013-02-14 NOTE — ED Notes (Addendum)
Pt reports having gb removed 3 weeks ago, by dr.jenkins.  On Friday she started having severe ab pain and is afraid that she may have any infection. Denies any nausea, vomiting, fever or diarrhea. Pt stated she took her last pain pill last night, was taking percocet.

## 2013-02-14 NOTE — ED Provider Notes (Signed)
CSN: [id number]     Arrival date & time 02/14/13  1547 History   First MD Initiated Contact with Patient 02/14/13 1556     Chief Complaint  Patient presents with  . Abdominal Pain   (Consider location/radiation/quality/duration/timing/severity/associated sxs/prior Treatment) HPI Comments: Patient is a 22 year old female who is one-month status post laparoscopic cholecystectomy. He presents to the emergency department with complaints of pain in her upper abdomen it is not associated with nausea, vomiting, or fever. She was seen here for similar complaints 2 weeks ago and CT scan at that time was negative. She denies fevers or chills. She denies any urinary complaints. She does do her menstrual periods are irregular due to her implant birth control.  Patient is a 22 y.o. female presenting with abdominal pain. The history is provided by the patient.  Abdominal Pain Pain location:  Epigastric Pain quality: cramping   Pain radiates to:  Does not radiate Pain severity:  Moderate Onset quality:  Gradual Duration:  3 days Timing:  Constant Progression:  Worsening Chronicity:  New   Past Medical History  Diagnosis Date  . HSV-2 infection complicating pregnancy   . Warts    Past Surgical History  Procedure Laterality Date  . Tonsillectomy    . Breast lumpectomy  2006  . Tonsillectomy  2011  . Right arm Right     has pins.  . Cholecystectomy N/A 01/25/2013    Procedure: LAPAROSCOPIC CHOLECYSTECTOMY;  Surgeon: Dalia Heading, MD;  Location: [facility];  Service: General;  Laterality: N/A;   Family History  Problem Relation Age of Onset  . Cancer Mother     uterine  . Diabetes Father   . Kidney failure Father    History  Substance Use Topics  . Smoking status: Never Smoker   . Smokeless tobacco: Never Used  . Alcohol Use: No   OB History   Grav Para Term Preterm Abortions TAB SAB Ect Mult Living   2 1 1       1      Review of Systems  Gastrointestinal: Positive for abdominal  pain.  All other systems reviewed and are negative.    Allergies  Gluten and Wheat  Home Medications   Current Outpatient Rx  Name  Route  Sig  Dispense  Refill  . ibuprofen (ADVIL,MOTRIN) 200 MG tablet   Oral   Take 200 mg by mouth every 6 (six) hours as needed for pain.         Marland Kitchen oxyCODONE-acetaminophen (PERCOCET) 7.5-325 MG per tablet   Oral   Take 1 tablet by mouth every 4 (four) hours as needed for pain.          BP 101/72  Pulse 90  Temp(Src) 98.2 F (36.8 C) (Oral)  Resp 24  Ht 5\' 9"  (1.753 m)  Wt 250 lb (113.399 kg)  BMI 36.9 kg/m2  SpO2 100%  Breastfeeding? No Physical Exam  Nursing note and vitals reviewed. Constitutional: She is oriented to person, place, and time. She appears well-developed and well-nourished. No distress.  HENT:  Head: Normocephalic and atraumatic.  Neck: Normal range of motion. Neck supple.  Cardiovascular: Normal rate and regular rhythm.  Exam reveals no gallop and no friction rub.   No murmur heard. Pulmonary/Chest: Effort normal and breath sounds normal. No respiratory distress. She has no wheezes.  Abdominal: Soft. Bowel sounds are normal. She exhibits no distension. There is no tenderness.  Musculoskeletal: Normal range of motion.  Neurological: She is alert and oriented  to person, place, and time.  Skin: Skin is warm and dry. She is not diaphoretic.    ED Course  Procedures (including critical care time) Labs Review Labs Reviewed  CBC WITH DIFFERENTIAL - Abnormal; Notable for the following:    Eosinophils Relative 7 (*)    All other components within normal limits  COMPREHENSIVE METABOLIC PANEL - Abnormal; Notable for the following:    Glucose, Bld 121 (*)    Total Bilirubin <0.1 (*)    GFR calc non Af Amer 81 (*)    All other components within normal limits  URINALYSIS, ROUTINE W REFLEX MICROSCOPIC - Abnormal; Notable for the following:    Specific Gravity, Urine >1.030 (*)    Ketones, ur TRACE (*)    Leukocytes,  UA SMALL (*)    All other components within normal limits  URINE MICROSCOPIC-ADD ON - Abnormal; Notable for the following:    Squamous Epithelial / LPF FEW (*)    Bacteria, UA MANY (*)    All other components within normal limits  URINE CULTURE  LIPASE, BLOOD  PREGNANCY, URINE   Imaging Review No results found.  MDM  No diagnosis found. The patient presents here with complaints of upper abdominal pain and is status post laparoscopic cholecystectomy. Workup today reveals no elevation of white count and lipase and liver functions are unremarkable. Her abdomen is nontender and is very benign. She recently had a CT scan performed, I am reluctant to perform another. There is no white count would suggest abscess or other surgical complication and liver functions and lipase are not consistent with hepatitis or pancreatitis. I doubt she has a retained stone. She will be discharged with pain medication and advised to followup with Dr. Lovell Sheehan tomorrow to arrange a followup appointment.    Geoffery Lyons, MD 02/14/13 1723

## 2013-02-16 LAB — URINE CULTURE: Colony Count: 40000

## 2013-02-23 ENCOUNTER — Ambulatory Visit: Payer: Self-pay | Admitting: Advanced Practice Midwife

## 2013-03-08 ENCOUNTER — Emergency Department (HOSPITAL_COMMUNITY)
Admission: EM | Admit: 2013-03-08 | Discharge: 2013-03-09 | Disposition: A | Discharge status: Home / Self Care | Payer: Medicaid Other | Attending: Emergency Medicine | Admitting: Emergency Medicine

## 2013-03-08 ENCOUNTER — Encounter (HOSPITAL_COMMUNITY): Payer: Self-pay | Admitting: *Deleted

## 2013-03-08 DIAGNOSIS — R51 Headache: Secondary | ICD-10-CM | POA: Insufficient documentation

## 2013-03-08 DIAGNOSIS — K029 Dental caries, unspecified: Secondary | ICD-10-CM | POA: Insufficient documentation

## 2013-03-08 DIAGNOSIS — K0889 Other specified disorders of teeth and supporting structures: Secondary | ICD-10-CM

## 2013-03-08 DIAGNOSIS — K089 Disorder of teeth and supporting structures, unspecified: Secondary | ICD-10-CM | POA: Insufficient documentation

## 2013-03-08 DIAGNOSIS — K1370 Unspecified lesions of oral mucosa: Secondary | ICD-10-CM | POA: Insufficient documentation

## 2013-03-08 DIAGNOSIS — Z8619 Personal history of other infectious and parasitic diseases: Secondary | ICD-10-CM | POA: Insufficient documentation

## 2013-03-08 MED ORDER — TRAMADOL HCL 50 MG PO TABS
ORAL_TABLET | ORAL | Status: AC
  Administered 2013-03-08: 100 mg
  Filled 2013-03-08: qty 2

## 2013-03-08 MED ORDER — KETOROLAC TROMETHAMINE 10 MG PO TABS
ORAL_TABLET | ORAL | Status: AC
  Administered 2013-03-08: 10 mg
  Filled 2013-03-08: qty 1

## 2013-03-08 MED ORDER — AMOXICILLIN 250 MG PO CAPS
ORAL_CAPSULE | ORAL | Status: AC
  Administered 2013-03-08: 500 mg
  Filled 2013-03-08: qty 2

## 2013-03-08 MED ORDER — ONDANSETRON HCL 4 MG PO TABS
ORAL_TABLET | ORAL | Status: AC
  Administered 2013-03-08: 4 mg
  Filled 2013-03-08: qty 1

## 2013-03-08 NOTE — ED Provider Notes (Signed)
CSN: [id number]     Arrival date & time 03/08/13  1648 History   None    Chief Complaint  Patient presents with  . Dental Pain   (Consider location/radiation/quality/duration/timing/severity/associated sxs/prior Treatment) Patient is a 22 y.o. female presenting with tooth pain. The history is provided by the patient.  Dental Pain Location:  Lower Lower teeth location:  32/RL 3rd molar and 30/RL 1st molar Quality:  Throbbing Severity:  Moderate Onset quality:  Gradual Duration:  3 days Timing:  Constant Progression:  Worsening Chronicity:  New Context: dental caries and poor dentition   Relieved by:  Nothing Worsened by:  Nothing tried Associated symptoms: facial pain, gum swelling and headaches   Associated symptoms: no difficulty swallowing, no drooling, no fever and no neck pain     Past Medical History  Diagnosis Date  . HSV-2 infection complicating pregnancy   . Warts    Past Surgical History  Procedure Laterality Date  . Tonsillectomy    . Tonsillectomy  2011  . Right arm Right     has pins.  . Cholecystectomy N/A 01/25/2013    Procedure: LAPAROSCOPIC CHOLECYSTECTOMY;  Surgeon: Dalia Heading, MD;  Location: [facility];  Service: General;  Laterality: N/A;  . Breast lumpectomy  2006   Family History  Problem Relation Age of Onset  . Cancer Mother     uterine  . Diabetes Father   . Kidney failure Father    History  Substance Use Topics  . Smoking status: Never Smoker   . Smokeless tobacco: Never Used  . Alcohol Use: No   OB History   Grav Para Term Preterm Abortions TAB SAB Ect Mult Living   2 1 1       1      Review of Systems  Constitutional: Negative for fever and activity change.       All ROS Neg except as noted in HPI  HENT: Negative for nosebleeds, drooling and neck pain.   Eyes: Negative for photophobia and discharge.  Respiratory: Negative for cough, shortness of breath and wheezing.   Cardiovascular: Negative for chest pain and palpitations.   Gastrointestinal: Negative for abdominal pain and blood in stool.  Genitourinary: Negative for dysuria, frequency and hematuria.  Musculoskeletal: Negative for back pain and arthralgias.  Skin: Negative.   Neurological: Positive for headaches. Negative for dizziness, seizures and speech difficulty.  Psychiatric/Behavioral: Negative for hallucinations and confusion.    Allergies  Gluten and Wheat  Home Medications   Current Outpatient Rx  Name  Route  Sig  Dispense  Refill  . acetaminophen (TYLENOL) 500 MG tablet   Oral   Take 500 mg by mouth every 6 (six) hours as needed for pain.         Marland Kitchen ibuprofen (ADVIL,MOTRIN) 200 MG tablet   Oral   Take 200 mg by mouth every 6 (six) hours as needed for pain.         Marland Kitchen oxyCODONE-acetaminophen (PERCOCET) 7.5-325 MG per tablet   Oral   Take 1 tablet by mouth every 4 (four) hours as needed for pain.          BP 111/68  Pulse 93  Temp(Src) 98.2 F (36.8 C) (Oral)  Resp 18  Ht 5\' 9"  (1.753 m)  Wt 260 lb (117.935 kg)  BMI 38.38 kg/m2  SpO2 100%  LMP 03/06/2013  Breastfeeding? No Physical Exam  Nursing note and vitals reviewed. Constitutional: She is oriented to person, place, and time. She appears well-developed  and well-nourished.  Non-toxic appearance.  HENT:  Head: Normocephalic.  Right Ear: Tympanic membrane and external ear normal.  Left Ear: Tympanic membrane and external ear normal.  Pt has a cavity of the right lower 1st molar. The right lower 3rd molar is is breaking through. No visible abscess noted. Air way is patent. There is no swelling under the tongue.  Eyes: EOM and lids are normal. Pupils are equal, round, and reactive to light.  Neck: Normal range of motion. Neck supple. Carotid bruit is not present.  Cardiovascular: Normal rate, regular rhythm, normal heart sounds, intact distal pulses and normal pulses.   Pulmonary/Chest: Breath sounds normal. No respiratory distress.  Abdominal: Soft. Bowel sounds are  normal. There is no tenderness. There is no guarding.  Musculoskeletal: Normal range of motion.  Lymphadenopathy:       Head (right side): No submandibular adenopathy present.       Head (left side): No submandibular adenopathy present.    She has no cervical adenopathy.  Neurological: She is alert and oriented to person, place, and time. She has normal strength. No cranial nerve deficit or sensory deficit.  Skin: Skin is warm and dry.  Psychiatric: She has a normal mood and affect. Her speech is normal.    ED Course  Procedures (including critical care time) Labs Review Labs Reviewed - No data to display Imaging Review No results found.  MDM  No diagnosis found. *I have reviewed nursing notes, vital signs, and all appropriate lab and imaging results for this patient.**  Vital signs stable. Rx for amoxil, diclofenac, and tramadol given to the patient. Pt given dental resources.  Kathie Dike, PA-C 03/08/13 2144

## 2013-03-08 NOTE — ED Notes (Signed)
"  sore" place on gum on rt mandibular .

## 2013-03-14 NOTE — ED Provider Notes (Signed)
Medical screening examination/treatment/procedure(s) were performed by non-physician practitioner and as supervising physician I was immediately available for consultation/collaboration.   Roney Marion, MD 03/14/13 [phone]

## 2013-04-15 ENCOUNTER — Other Ambulatory Visit: Payer: Self-pay

## 2013-04-26 ENCOUNTER — Ambulatory Visit (INDEPENDENT_AMBULATORY_CARE_PROVIDER_SITE_OTHER): Payer: BC Managed Care – PPO | Admitting: Adult Health

## 2013-04-26 ENCOUNTER — Encounter: Payer: Self-pay | Admitting: Adult Health

## 2013-04-26 VITALS — BP 110/70 | Ht 69.0 in | Wt 279.0 lb

## 2013-04-26 DIAGNOSIS — A6000 Herpesviral infection of urogenital system, unspecified: Secondary | ICD-10-CM

## 2013-04-26 HISTORY — DX: Herpesviral infection of urogenital system, unspecified: A60.00

## 2013-04-26 MED ORDER — VALACYCLOVIR HCL 1 G PO TABS: 1000.0000 mg | ORAL_TABLET | Freq: Two times a day (BID) | ORAL | Status: DC

## 2013-04-26 MED ORDER — HYDROCODONE-ACETAMINOPHEN 5-325 MG PO TABS: 1.0000 | ORAL_TABLET | Freq: Four times a day (QID) | ORAL | Status: DC | PRN

## 2013-04-26 NOTE — Progress Notes (Signed)
Subjective:     Patient ID: Brittney Nicholson, female   DOB: 06/29/1990, 22 y.o.   MRN: [id number]  HPI Brittney Nicholson is a 22 year old white female in complaining of pain in right labia, ?herpes,had when pregnant.  Review of Systems See HPI Reviewed past medical,surgical, social and family history. Reviewed medications and allergies.     Objective:   Physical Exam BP 110/70  Ht 5\' 9"  (1.753 m)  Wt 279 lb (126.554 kg)  BMI 41.18 kg/m2  LMP 04/09/2013  Breastfeeding? No   Skin warm and dry.Pelvic: external genitalia is normal in appearance, except for redness and irritation right labia, no blisters noted but tender when touched and feels puffy , vagina: scant discharge without odor, cervix:smooth and bulbous, uterus: normal size, shape and contour, non tender, no masses felt, adnexa: no masses or tenderness noted. Assessment:     Herpes    Plan:     Rx Valtrex 1 gm 1 bid x 10 days, #20 with 3 refills   Rx norco 5/325 mg 1 every 6 hours prn #30 no refills Follow up in 2 months for pap or before if needed

## 2013-04-26 NOTE — Patient Instructions (Signed)
Take valtrex bid x 10 days no sex  Follow up in 2 months for pap

## 2013-06-28 ENCOUNTER — Other Ambulatory Visit: Payer: Self-pay | Admitting: Adult Health

## 2013-06-28 ENCOUNTER — Encounter: Payer: Self-pay | Admitting: *Deleted

## 2013-07-15 ENCOUNTER — Telehealth: Payer: Self-pay | Admitting: Adult Health

## 2013-07-15 NOTE — Telephone Encounter (Signed)
Spoke with pt. Pt having abnormal periods. Advised to schedule an appt with a provider to have things checked out. Pt voiced understanding. Call transferred to front desk for appt. JSY

## 2013-07-20 ENCOUNTER — Encounter: Payer: Self-pay | Admitting: *Deleted

## 2013-07-20 ENCOUNTER — Ambulatory Visit: Payer: BC Managed Care – PPO | Admitting: Adult Health

## 2013-12-07 ENCOUNTER — Encounter (HOSPITAL_COMMUNITY): Payer: Self-pay | Admitting: Emergency Medicine

## 2013-12-07 ENCOUNTER — Emergency Department (HOSPITAL_COMMUNITY)
Admission: EM | Admit: 2013-12-07 | Discharge: 2013-12-07 | Disposition: A | Discharge status: Home / Self Care | Payer: BC Managed Care – PPO | Attending: Emergency Medicine | Admitting: Emergency Medicine

## 2013-12-07 DIAGNOSIS — H00013 Hordeolum externum right eye, unspecified eyelid: Secondary | ICD-10-CM

## 2013-12-07 DIAGNOSIS — H579 Unspecified disorder of eye and adnexa: Secondary | ICD-10-CM | POA: Diagnosis present

## 2013-12-07 DIAGNOSIS — Z79899 Other long term (current) drug therapy: Secondary | ICD-10-CM | POA: Diagnosis not present

## 2013-12-07 DIAGNOSIS — H00019 Hordeolum externum unspecified eye, unspecified eyelid: Secondary | ICD-10-CM | POA: Diagnosis not present

## 2013-12-07 DIAGNOSIS — Z791 Long term (current) use of non-steroidal anti-inflammatories (NSAID): Secondary | ICD-10-CM | POA: Diagnosis not present

## 2013-12-07 DIAGNOSIS — Z8619 Personal history of other infectious and parasitic diseases: Secondary | ICD-10-CM | POA: Diagnosis not present

## 2013-12-07 MED ORDER — HYDROCODONE-ACETAMINOPHEN 5-325 MG PO TABS: ORAL_TABLET | ORAL | Status: DC

## 2013-12-07 MED ORDER — FLUORESCEIN SODIUM 1 MG OP STRP
ORAL_STRIP | OPHTHALMIC | Status: AC
  Administered 2013-12-07: 20:00:00 1 via OPHTHALMIC
  Filled 2013-12-07: qty 1

## 2013-12-07 MED ORDER — TOBRAMYCIN 0.3 % OP SOLN
OPHTHALMIC | Status: AC
  Administered 2013-12-07: 1 [drp]
  Filled 2013-12-07: qty 5

## 2013-12-07 MED ORDER — FLUORESCEIN SODIUM 1 MG OP STRP
1.0000 | ORAL_STRIP | Freq: Once | OPHTHALMIC | Status: AC
  Administered 2013-12-07: 1 via OPHTHALMIC

## 2013-12-07 MED ORDER — TOBRAMYCIN 0.3 % OP OINT
TOPICAL_OINTMENT | Freq: Four times a day (QID) | OPHTHALMIC | Status: DC
  Filled 2013-12-07: qty 3.5

## 2013-12-07 NOTE — ED Provider Notes (Signed)
CSN: [id number]     Arrival date & time 12/07/13  1948 History   First MD Initiated Contact with Patient 12/07/13 2004     Chief Complaint  Patient presents with  . Eye Problem      HPI Pt was seen at 2005.  Per pt, c/o gradual onset and persistence of constant right lower eyelid "stye" that began 1 week ago. Describes her lower eyelid as "sore."  Does not wear contact lenses. Pt denies eye injury, no eyelid edema, no eye pain, no visual changes, no fevers.    Past Medical History  Diagnosis Date  . HSV-2 infection complicating pregnancy   . Warts   . 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 Tonsillectomy    . Tonsillectomy  2011  . Right arm Right     has pins.  . Cholecystectomy N/A 01/25/2013    Procedure: LAPAROSCOPIC CHOLECYSTECTOMY;  Surgeon: Dalia HeadingMark A Jenkins, MD;  Location: [facility];  Service: General;  Laterality: N/A;  . Breast lumpectomy  2006   Family History  Problem Relation Age of Onset  . Cancer Mother     uterine  . Diabetes Father   . Kidney failure Father    History  Substance Use Topics  . Smoking status: Never Smoker   . Smokeless tobacco: Never Used  . Alcohol Use: No   OB History   Grav Para Term Preterm Abortions TAB SAB Ect Mult Living   2 1 1       1      Review of Systems ROS: Statement: All systems negative except as marked or noted in the HPI; Constitutional: Negative for fever and chills. ; ; Eyes: +stye right lower lid. Negative for eye pain, redness and discharge. ; ; ENMT: Negative for ear pain, hoarseness, nasal congestion, sinus pressure and sore throat. ; ; Cardiovascular: Negative for chest pain, palpitations, diaphoresis, dyspnea and peripheral edema. ; ; Respiratory: Negative for cough, wheezing and stridor. ; ; Gastrointestinal: Negative for nausea, vomiting, diarrhea, abdominal pain, blood in stool, hematemesis, jaundice and rectal bleeding. . ; ; Genitourinary: Negative for dysuria, flank pain and  hematuria. ; ; Musculoskeletal: Negative for back pain and neck pain. Negative for swelling and trauma.; ; Skin: Negative for pruritus, rash, abrasions, blisters, bruising and skin lesion.; ; Neuro: Negative for headache, lightheadedness and neck stiffness. Negative for weakness, altered level of consciousness , altered mental status, extremity weakness, paresthesias, involuntary movement, seizure and syncope.       Allergies  Gluten and Wheat  Home Medications   Prior to Admission medications   Medication Sig Start Date End Date Taking? Authorizing Provider  acetaminophen (TYLENOL) 500 MG tablet Take 500 mg by mouth every 6 (six) hours as needed for pain.    Historical Provider, MD  HYDROcodone-acetaminophen (NORCO/VICODIN) 5-325 MG per tablet Take 1 tablet by mouth every 6 (six) hours as needed for moderate pain. 04/26/13   Adline PotterJennifer A Griffin, NP  ibuprofen (ADVIL,MOTRIN) 200 MG tablet Take 200 mg by mouth every 6 (six) hours as needed for pain.    Historical Provider, MD  valACYclovir (VALTREX) 1000 MG tablet Take 1 tablet (1,000 mg total) by mouth 2 (two) times daily. 04/26/13   Adline PotterJennifer A Griffin, NP   BP 125/51  Pulse 78  Temp(Src) 98.8 F (37.1 C) (Oral)  Resp 18  Ht 5\' 9"  (1.753 m)  Wt 254 lb (115.214 kg)  BMI 37.49 kg/m2  SpO2 100%  LMP  11/12/2013 Physical Exam 2010: Physical examination:  Nursing notes reviewed; Vital signs and O2 SAT reviewed;  Constitutional: Well developed, Well nourished, Well hydrated, In no acute distress; Head:  Normocephalic, atraumatic; Eyes: EOMI, PERRL, No scleral icterus; Eye Exam: Right pupil: Size: 3 mm; Findings: Normal, Briskly reactive; Left pupil: Size: 3 mm; Findings: Normal, Briskly reactive; Extraocular movement: Bilateral normal, No nystagmus. ; Eyelid: Bilateral normal, No edema, no erythema.  No ptosis.  Bilat upper and lower eyelids everted for exam, no FB identified. +draining hordeolum right inner lower lid.; Conjunctiva and sclera:  Bilateral normal, No conjunctival injection, no chemosis.  No obvious hyphema or hypopion.  ; Cornea and anterior chamber:   Fluorescein stain right eye: negative for corneal abrasion, no corneal ulcer, neg Seidel's.; ; Diagnostic medications: Right fluorescein; Diagnostic instrument: Ophthalmoscope, Wood's lamp;; Visual acuity right: 20/ 30; Visual acuity left: 20/20.  ENMT: Mouth and pharynx normal, Mucous membranes moist; Neck: Supple, Full range of motion, No lymphadenopathy; Cardiovascular: Regular rate and rhythm, No murmur, rub, or gallop; Respiratory: Breath sounds clear & equal bilaterally, No rales, rhonchi, wheezes.  Speaking full sentences with ease, Normal respiratory effort/excursion; Chest: Nontender, Movement normal; Abdomen: Soft, Nontender, Nondistended, Normal bowel sounds; Extremities: Pulses normal, No tenderness, No edema, No calf edema or asymmetry.; Neuro: AA&Ox3, Major CN grossly intact.  Speech clear. No gross focal motor or sensory deficits in extremities. Climbs on and off stretcher easily by herself. Gait steady.; Skin: Color normal, Warm, Dry.   ED Course  Procedures     MDM  MDM Reviewed: previous chart, nursing note and vitals    2030:  No corneal abrasion. Hordeolum already draining. Will tx topical abx, warm compresses, f/u Ophtho MD. Dx and testing d/w pt.  Questions answered.  Verb understanding, agreeable to d/c home with outpt f/u.    Laray AngerKathleen M McManus, DO 12/09/13 1635

## 2013-12-07 NOTE — Discharge Instructions (Signed)
°Emergency Department Resource Guide °1) Find a Doctor and Pay Out of Pocket °Although you won't have to find out who is covered by your insurance plan, it is a good idea to ask around and get recommendations. You will then need to call the office and see if the doctor you have chosen will accept you as a new patient and what types of options they offer for patients who are self-pay. Some doctors offer discounts or will set up payment plans for their patients who do not have insurance, but you will need to ask so you aren't surprised when you get to your appointment. ° °2) Contact Your Local Health Department °Not all health departments have doctors that can see patients for sick visits, but many do, so it is worth a call to see if yours does. If you don't know where your local health department is, you can check in your phone book. The CDC also has a tool to help you locate your state's health department, and many state websites also have listings of all of their local health departments. ° °3) Find a Walk-in Clinic °If your illness is not likely to be very severe or complicated, you may want to try a walk in clinic. These are popping up all over the country in pharmacies, drugstores, and shopping centers. They're usually staffed by nurse practitioners or physician assistants that have been trained to treat common illnesses and complaints. They're usually fairly quick and inexpensive. However, if you have serious medical issues or chronic medical problems, these are probably not your best option. ° °No Primary Care Doctor: °- Call Health Connect at  832-8000 - they can help you locate a primary care doctor that  accepts your insurance, provides certain services, etc. °- Physician Referral Service- 1-800-533-3463 ° °Chronic Pain Problems: °Organization         Address  Phone   Notes  °[facility]  (336) 297-2271 Patients need to be referred by their primary care doctor.  ° °Medication  Assistance: °Organization         Address  Phone   Notes  °[place] [facility] [address] °[place], [place] [address] (336) 641-8030 --Must be a resident of [place] °-- Must have NO insurance coverage whatsoever (no Medicaid/ Medicare, etc.) °-- The pt. MUST have a primary care doctor that directs their care regularly and follows them in the community °  °[facility]  (866) 331-1348   °[employer]  (888) 892-1162   ° °Agencies that provide inexpensive medical care: °Organization         Address  Phone   Notes  °[facility]  (336) 832-8035   °[facility]    (336) 832-7272   °[facility] [address] °[place], [place] [address] (336) 832-4777   °[facility] [address], °[place] (336) 271-4999   °[facility]    (336) 373-0678   °[facility]    (336) 272-1050   °[facility] ° [address], [place] Phone:  (336) 832-4444, Fax:  (336) 832-4440 Hours of Operation:  9 am - 6 pm, M-F.  Also accepts Medicaid/Medicare and self-pay.  °[facility] ° [address], [place] Phone: (336) 832-3150, Fax: (336) [phone]. Hours of Operation:  8:30 am - 5:30 pm, M-F.  Also accepts Medicaid and self-pay.  °[facility] [place] [address], [place] Phone: (336) 878-6027   °[facility] [address], [place], [place] (336)723-1848, Ext. 123 Mondays & Thursdays: 7-9 AM.  First 15 patients are seen on a first come, first serve basis. °  ° °Medicaid-accepting [place] Providers: ° °Organization         Address  Phone   Notes  °[facility] [address], [place] (336) 641-2100 Also accepts self-pay patients.  °[facility] [address], [place] ° (336) 856-9996   °[facility] [address], [place]  (336) 288-8857   °[facility] [address] [place] [address], [place] (336) 299-7000   °Veita Bland [address], [place]  ° (336) 373-1557 Only accepts [place] Access Medicaid patients after they have their name applied to their card.  ° °Self-Pay (no insurance) in [place]: ° °Organization         Address  Phone   Notes  °Sickle Cell Patients, [facility] [address], [place] (336) 832-1970   °[facility] [address], [place] (336) 832-4400   °[facility] ° [address], [place] (336) 992-4800   °[facility]/Dr. Osei-Bonsu ° [address] [place] [address], [place] or [address], [place] (336) 841-8500 Phone number for both [place] and [place] locations is the same.  °[facility] [address], [place] (336) 299-0000   °[facility] [address] [place] [address], [place] or [address] (336) 852-7530 °(336) 878-2260   °[facility] [address], [place] (336) 350-1642, phone; (336) 294-5005, fax Sees patients 1st and 3rd Saturday of every month.  Must not qualify for public or private insurance (i.e. Medicaid, Medicare, [place] Health Choice, Veterans' Benefits) • Household income should be no more than 200% of the poverty level •The clinic cannot treat you if you are pregnant or think you are pregnant • Sexually transmitted diseases are not treated at the clinic.  ° ° °Dental Care: °Organization         Address  Phone  Notes  °[place] [facility] [address], [place] (336) 641-6152 Accepts children up to age 21 who are enrolled in Medicaid or [place] Health Choice; pregnant women with a Medicaid card; and children who have applied for Medicaid or [place] Health Choice, but were declined, whose parents can pay a reduced fee at time of service.  °[place]  [facility] [place]  [address], [place] (336) 641-7733 Accepts children up to age 21 who are enrolled in Medicaid or [place] Health Choice; pregnant women with a Medicaid card; and children who have applied for Medicaid or [place] Health Choice, but were declined, whose parents can pay a reduced fee at time of service.  °[facility] ° [address], [place] (336) 641-4533 Patients are seen by appointment only. Walk-ins are not accepted. [facility] will see patients 18 years of age and older. °Monday - Tuesday (8am-5pm) °Most Wednesdays (8:30-5pm) °$30 per visit, cash only  °[facility] ° [address], [place] (336) 641-4533 Patients are seen by appointment only. Walk-ins are not accepted. [facility] will see patients 18 years of age and older. °One   Wednesday Evening (Monthly: Volunteer Based).  $30 per visit, cash only  °[facility]  (919) 537-3737 for adults; Children under age 4, call Graduate Pediatric Dentistry at (919) [phone]. Children aged 4-14, please call (919) [phone] to request a pediatric application. ° Dental services are provided in all areas of dental care including fillings, crowns and bridges, complete and partial dentures, implants, gum treatment, root canals, and extractions. Preventive care is also provided. Treatment is provided to both adults and children. °Patients are selected via a lottery and there is often a waiting list. °  °[facility] [address], °[place] ° (336) 763-8833 [email or web address] °  °[facility] [address], [place], [place] (336)723-1848, Ext. 123 Second and Fourth Thursday of each month, opens at 6:30 AM; Clinic ends at 9 AM.  Patients are seen on a first-come first-served basis, and a limited number are seen during each clinic.  ° °[facility] ° [address], [place], [place] (336) 723-7904    Eligibility Requirements °You must have lived in [place], [place], or [place] counties for at least the last three months. °  You cannot be eligible for state or federal sponsored healthcare insurance, including Veterans Administration, Medicaid, or Medicare. °  You generally cannot be eligible for healthcare insurance through your employer.  °  How to apply: °Eligibility screenings are held every Tuesday and Wednesday afternoon from 1:00 pm until 4:00 pm. You do not need an appointment for the interview!  °[facility] [address], [place], [place] 336-631-2330   °[facility]  336-342-8273   °[facility]  336-703-3100   °[facility]  336-570-6415   ° °Behavioral Health Resources in the Community: °Intensive Outpatient Programs °Organization         Address  Phone  Notes  °[facility] [address], [place], [place] 336-878-6098   °[facility] [address], [place], [place] 336-832-9800   °[facility] [address], [place], [place] ° 336-882-2125   °[facility] [address],  °[place], [place] 1-800-853-5163 or 336-641-4981   °Substance Abuse Resources °Organization         Address  Phone  Notes  °[facility]  336-882-2125   °[facility]  336-784-9470   °[facility]  336-285-9073   °[facility]  336-845-3988   °Residential & Outpatient Substance Abuse Program  1-800-659-3381   °Psychological Services °Organization         Address  Phone  Notes  °[facility]  336- 832-9600   °[facility]  336- 378-7881   °[facility] [address], [place] 1-800-853-5163 or 336-641-4981   ° °Mobile Crisis Teams °Organization         Address  Phone  Notes  °[facility], Mobile Crisis Care Unit  1-877-626-1772   °Assertive °[facility] ° [address]  [place], [place] 336-834-9664   °Sharon DeEsch [address] °[place] 336-554-5454   ° °Self-Help/Support Groups °Organization         Address  Phone             Notes  °[facility] of  - variety of support groups  336- 373-1402 Call for more information  °Narcotics Anonymous (NA), [facility] [address], °[place]  2 meetings at this location  ° °  Residential Treatment Programs Organization         Address  Phone  Notes  [facility] [address],    [place]  [phone]   [facility]  [address], [place] [phone], [place], [place] [phone]) [phone]   [facility] [address], [place] [phone] Admissions: 8am-3pm M-F  [facility] [address]    [place], [place] [phone]   [facility] [address], [place], [place] [phone]   [facility] [address]  [place], [place] [phone]   [facility] Intensive Outpatient [address], [place], [place] [phone]   [facility] [address]  [place], [place] [phone])[phone] or [phone]   [facility] ([facility]) [address] [place], [place] [phone]) [phone] Accepts Medicaid  [facility] [address]  [place] [phone] Substance Abuse/Addiction Treatment   [facility]         Address  Phone  Notes  [facility]  (315)435-3326(888) (432)[phone]   Angie FavaJulie Brannon, PhD [address], [address] [place], [place]   ([phone](336) [phone] or [phone](336) [phone]   [facility]   [address] [place], [place] [phone](336) [phone]   [facility] [address], [place], [place] [phone](336) [phone] Insurance/Medicaid/sponsorship through [facility]  [facility] [address]                                    [place], [place] [phone](336) [phone] Therapy/tele-psych/case    [facility] [address]   [place], [place] [phone](336) [phone]    Dr. Lolly MustacheArfeen  [phone](336) [phone]   [facility]  [employer] [facility] 1) [address], [place] 2) [address], [place] 3)  [address], [place] [phone]) [phone](336) [phone]  [phone](336) [phone]   [facility] Child Abuse Hot[facility] 820-255-3215(336) (905)699-7406 or [phone](336) [phone] (After Hours)      Use the tobramycin eye ointment: 1 thin strip into right lower eyelid 4 times per day for the next 7 days.  Call the Eye doctor tomorrow to schedule a follow up appointment within the next 2 days.  Return to the Emergency Department immediately sooner if worsening.

## 2013-12-07 NOTE — ED Notes (Signed)
Woods lamp to bedside 

## 2013-12-07 NOTE — ED Notes (Addendum)
Pt reporting pain in right eye.  States "it might be a stye, but I don't know."  Reports feeling object for aprox 1 week.  Reporting visual changes and increased pain in past couple days.

## 2013-12-23 ENCOUNTER — Ambulatory Visit: Payer: BC Managed Care – PPO | Admitting: Obstetrics and Gynecology

## 2013-12-27 ENCOUNTER — Ambulatory Visit: Payer: BC Managed Care – PPO | Admitting: Adult Health

## 2013-12-28 ENCOUNTER — Ambulatory Visit (INDEPENDENT_AMBULATORY_CARE_PROVIDER_SITE_OTHER): Payer: BC Managed Care – PPO | Admitting: Women's Health

## 2013-12-28 ENCOUNTER — Encounter: Payer: Self-pay | Admitting: Women's Health

## 2013-12-28 VITALS — BP 112/68 | Ht 69.0 in | Wt 270.0 lb

## 2013-12-28 DIAGNOSIS — N9489 Other specified conditions associated with female genital organs and menstrual cycle: Secondary | ICD-10-CM

## 2013-12-28 DIAGNOSIS — N949 Unspecified condition associated with female genital organs and menstrual cycle: Secondary | ICD-10-CM

## 2013-12-28 DIAGNOSIS — A6000 Herpesviral infection of urogenital system, unspecified: Secondary | ICD-10-CM

## 2013-12-28 LAB — POCT WET PREP (WET MOUNT): Clue Cells Wet Prep Whiff POC: NEGATIVE

## 2013-12-28 LAB — POCT URINALYSIS DIPSTICK
Blood, UA: NEGATIVE
GLUCOSE UA: NEGATIVE
Ketones, UA: NEGATIVE
NITRITE UA: NEGATIVE
Protein, UA: NEGATIVE

## 2013-12-28 MED ORDER — HYDROCODONE-ACETAMINOPHEN 5-325 MG PO TABS: ORAL_TABLET | ORAL | Status: DC

## 2013-12-28 MED ORDER — VALACYCLOVIR HCL 1 G PO TABS: 1000.0000 mg | ORAL_TABLET | Freq: Two times a day (BID) | ORAL | Status: DC

## 2013-12-28 NOTE — Progress Notes (Signed)
Patient ID: Brittney CromerHeather E Nicholson, female   DOB: 1991/03/26, 23 y.o.   MRN: [id number]   [facility]  Patient name: Brittney Nicholson MRN [id number]  Date of birth: 1991/03/26  CC & HPI:  Brittney Nicholson is a 23 y.o. Caucasian female presenting today for intense vaginal burning/pain x 2wks, thought may be yeast infection-tried otc yeast meds w/o improvement. No abnormal d/c or odor, no itching. H/o HSV, feels like may be outbreak. Requests pain meds. Has never had pap smear.   Pertinent History Reviewed:  Medical & Surgical Hx:   Past Medical History  Diagnosis Date  . HSV-2 infection complicating pregnancy   . Warts   . 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 Tonsillectomy    . Tonsillectomy  2011  . Right arm Right     has pins.  . Cholecystectomy N/A 01/25/2013    Procedure: LAPAROSCOPIC CHOLECYSTECTOMY;  Surgeon: Dalia HeadingMark A Jenkins, MD;  Location: [facility];  Service: General;  Laterality: N/A;  . Breast lumpectomy  2006   Medications: Reviewed & Updated - see associated section Social History: Reviewed -  reports that she has never smoked. She has never used smokeless tobacco.  Objective Findings:  Vitals: BP 112/68  Ht 5\' 9"  (1.753 m)  Wt 270 lb (122.471 kg)  BMI 39.85 kg/m2  LMP 12/14/2013  Physical Examination: General appearance - alert, well appearing, and in no distress Pelvic - ulcerated, erythematous, tender area at introitus, scant thin white nonodorous d/c  Results for orders placed in visit on 12/28/13 (from the past 24 hour(s))  POCT URINALYSIS DIPSTICK   Collection Time    12/28/13  3:49 PM      Result Value Ref Range   Color, UA       Clarity, UA       Glucose, UA neg     Bilirubin, UA       Ketones, UA neg     Spec Grav, UA       Blood, UA neg     pH, UA       Protein, UA neg     Urobilinogen, UA       Nitrite, UA neg     Leukocytes, UA Trace    POCT WET PREP (WET MOUNT)   Collection Time   12/28/13  4:35 PM      Result Value Ref Range   Source Wet Prep POC vaginal     WBC, Wet Prep HPF POC many     Bacteria Wet Prep HPF POC none     BACTERIA WET PREP MORPHOLOGY POC       Clue Cells Wet Prep HPF POC None     CLUE CELLS WET PREP WHIFF POC Negative Whiff     Yeast Wet Prep HPF POC None     KOH Wet Prep POC       Trichomonas Wet Prep HPF POC none       Assessment & Plan:  A:   Genital HSV outbreak P:  Rx valtrex 1gm BID x 10d, then 1gm daily thereafter for suppression  Rx vicodin 1-2 q 6hr prn pain #15, 0RF  F/U 2wks for f/u and pap & physical   Marge DuncansBooker, Kimberly Randall CNM, [facility]NP-BC 12/28/2013 4:35 PM

## 2013-12-29 LAB — GC/CHLAMYDIA PROBE AMP
CT PROBE, AMP APTIMA: NEGATIVE
GC PROBE AMP APTIMA: NEGATIVE

## 2014-01-11 ENCOUNTER — Other Ambulatory Visit: Payer: BC Managed Care – PPO | Admitting: Advanced Practice Midwife

## 2014-01-19 ENCOUNTER — Other Ambulatory Visit: Payer: BC Managed Care – PPO | Admitting: Advanced Practice Midwife

## 2014-01-25 ENCOUNTER — Emergency Department (HOSPITAL_COMMUNITY)
Admission: EM | Admit: 2014-01-25 | Discharge: 2014-01-25 | Disposition: A | Discharge status: Home / Self Care | Payer: BC Managed Care – PPO | Attending: Emergency Medicine | Admitting: Emergency Medicine

## 2014-01-25 ENCOUNTER — Encounter (HOSPITAL_COMMUNITY): Payer: Self-pay | Admitting: Emergency Medicine

## 2014-01-25 DIAGNOSIS — S335XXA Sprain of ligaments of lumbar spine, initial encounter: Secondary | ICD-10-CM | POA: Diagnosis not present

## 2014-01-25 DIAGNOSIS — Z8619 Personal history of other infectious and parasitic diseases: Secondary | ICD-10-CM | POA: Diagnosis not present

## 2014-01-25 DIAGNOSIS — X500XXA Overexertion from strenuous movement or load, initial encounter: Secondary | ICD-10-CM | POA: Diagnosis not present

## 2014-01-25 DIAGNOSIS — S99919A Unspecified injury of unspecified ankle, initial encounter: Secondary | ICD-10-CM | POA: Diagnosis not present

## 2014-01-25 DIAGNOSIS — S39012A Strain of muscle, fascia and tendon of lower back, initial encounter: Secondary | ICD-10-CM

## 2014-01-25 DIAGNOSIS — Y9389 Activity, other specified: Secondary | ICD-10-CM | POA: Insufficient documentation

## 2014-01-25 DIAGNOSIS — S8990XA Unspecified injury of unspecified lower leg, initial encounter: Secondary | ICD-10-CM | POA: Diagnosis not present

## 2014-01-25 DIAGNOSIS — Y929 Unspecified place or not applicable: Secondary | ICD-10-CM | POA: Diagnosis not present

## 2014-01-25 DIAGNOSIS — S99929A Unspecified injury of unspecified foot, initial encounter: Secondary | ICD-10-CM

## 2014-01-25 DIAGNOSIS — IMO0002 Reserved for concepts with insufficient information to code with codable children: Secondary | ICD-10-CM | POA: Diagnosis present

## 2014-01-25 MED ORDER — HYDROCODONE-ACETAMINOPHEN 5-325 MG PO TABS: ORAL_TABLET | ORAL | Status: DC

## 2014-01-25 MED ORDER — IBUPROFEN 800 MG PO TABS: 800.0000 mg | ORAL_TABLET | Freq: Three times a day (TID) | ORAL | Status: DC

## 2014-01-25 MED ORDER — CYCLOBENZAPRINE HCL 10 MG PO TABS: 10.0000 mg | ORAL_TABLET | Freq: Three times a day (TID) | ORAL | Status: DC | PRN

## 2014-01-25 NOTE — ED Provider Notes (Signed)
CSN: [id number]     Arrival date & time 01/25/14  1348 History   First MD Initiated Contact with Patient 01/25/14 1514     Chief Complaint  Patient presents with  . Back Pain     (Consider location/radiation/quality/duration/timing/severity/associated sxs/prior Treatment) The history is provided by the patient.   Brittney Nicholson is a 23 y.o. female who presents to the Emergency Department complaining of left low back and left thigh pain.  She states the pain began suddenly last evening when she slipped on some oil that was slipped on the floor of her garage.  She states the pain was "not that bad" last evening, but worse this morning.  She took one vicodin and an ibuprofen this morning that she had left from a previous prescription with mild relief.  Pain is worse with standing and twisting movements and improve with sitting and lying down.  She denies other injuries, abdominal pain, flank pain, numbness or weakness of the LE, swelling, chest pain, incontinence of bladder or bowel, or shortness of breath.    Past Medical History  Diagnosis Date  . HSV-2 infection complicating pregnancy   . Warts   . 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 Tonsillectomy    . Tonsillectomy  2011  . Right arm Right     has pins.  . Cholecystectomy N/A 01/25/2013    Procedure: LAPAROSCOPIC CHOLECYSTECTOMY;  Surgeon: Dalia Heading, MD;  Location: [facility];  Service: General;  Laterality: N/A;  . Breast lumpectomy  2006   Family History  Problem Relation Age of Onset  . Cancer Mother     uterine  . Diabetes Father   . Kidney failure Father    History  Substance Use Topics  . Smoking status: Never Smoker   . Smokeless tobacco: Never Used  . Alcohol Use: No   OB History   Grav Para Term Preterm Abortions TAB SAB Ect Mult Living   2 1 1       1      Review of Systems  Constitutional: Negative for fever.  Respiratory: Negative for shortness of breath.     Gastrointestinal: Negative for vomiting, abdominal pain and constipation.  Genitourinary: Negative for dysuria, hematuria, flank pain, decreased urine volume and difficulty urinating.       No perineal numbness or incontinence of urine or feces  Musculoskeletal: Positive for back pain. Negative for joint swelling.  Skin: Negative for rash.  Neurological: Negative for weakness and numbness.  All other systems reviewed and are negative.     Allergies  Gluten and Wheat  Home Medications   Prior to Admission medications   Medication Sig Start Date End Date Taking? Authorizing Provider  HYDROcodone-acetaminophen (NORCO/VICODIN) 5-325 MG per tablet Take 1-2 tablets by mouth once as needed. 12/28/13  Yes Marge Duncans, CNM  ibuprofen (ADVIL,MOTRIN) 200 MG tablet Take 200 mg by mouth every 6 (six) hours as needed for pain.   Yes Historical Provider, MD   BP 121/55  Pulse 71  Temp(Src) 98.5 F (36.9 C) (Oral)  Resp 16  SpO2 100%  LMP 01/18/2014 Physical Exam  Nursing note and vitals reviewed. Constitutional: She is oriented to person, place, and time. She appears well-developed and well-nourished. No distress.  HENT:  Head: Normocephalic and atraumatic.  Neck: Normal range of motion. Neck supple.  Cardiovascular: Normal rate, regular rhythm, normal heart sounds and intact distal pulses.   No murmur heard. Pulmonary/Chest: Effort  normal and breath sounds normal. No respiratory distress.  Abdominal: Soft. She exhibits no distension. There is no tenderness.  Musculoskeletal: She exhibits tenderness. She exhibits no edema.       Lumbar back: She exhibits tenderness and pain. She exhibits normal range of motion, no swelling, no deformity, no laceration and normal pulse.       Back:       Left upper leg: She exhibits tenderness. She exhibits no bony tenderness, no swelling, no edema, no deformity and no laceration.       Legs: Localized ttp of the left lumbar paraspinal  muscles and along the posterior left lower leg.   No spinal tenderness. No deformity of the LE, bruising or edema.  DP pulses are brisk and symmetrical.  Distal sensation intact.  Hip Flexors/Extensors are intact.  Pt has 5/5 strength against resistance of bilateral lower extremities.     Neurological: She is alert and oriented to person, place, and time. She has normal strength. No sensory deficit. She exhibits normal muscle tone. Coordination and gait normal.  Reflex Scores:      Patellar reflexes are 2+ on the right side and 2+ on the left side.      Achilles reflexes are 2+ on the right side and 2+ on the left side. Skin: Skin is warm and dry. No rash noted.    ED Course  Procedures (including critical care time) Labs Review Labs Reviewed - No data to display  Imaging Review No results found.   EKG Interpretation None      MDM   Final diagnoses:  Lumbar strain, initial encounter    Patient is well appearing, VSS.  No concerning sx's for emergent neurological or infectious process.  Ambulates with a steady gait.  Likely muscular strain.  No focal neuro deficits  Patient has appt on Friday with her PMD.  She agrees to symptomatic tx with ibuprofen, flexeril and vicodin     Tabby Beaston L. Balin Vandegrift, PA-C 01/25/14 1703

## 2014-01-25 NOTE — ED Notes (Signed)
Pt states that she slipped on some oil in her garage last night, c/o lower back pain, posterior left thigh pain and posterior left knee pain, cms intact distal.

## 2014-01-25 NOTE — Discharge Instructions (Signed)

## 2014-01-26 NOTE — ED Provider Notes (Signed)
Medical screening examination/treatment/procedure(s) were performed by non-physician practitioner and as supervising physician I was immediately available for consultation/collaboration.   EKG Interpretation None       Daniell Mancinas L Myrla Malanowski, MD 01/26/14 1110 

## 2014-03-25 ENCOUNTER — Other Ambulatory Visit: Payer: Self-pay

## 2014-04-11 ENCOUNTER — Encounter (HOSPITAL_COMMUNITY): Payer: Self-pay | Admitting: Emergency Medicine

## 2014-06-15 IMAGING — CT CT ABD-PELV W/ CM
2 of 4 series · 16 of 46 positions shown, 18 images · IV contrast (Omnipaque 300)
Comparison: Preoperative CT abdomen and pelvis 01/25/2013.

CLINICAL DATA: Cholecystectomy 5 days ago with increasing abdominal
pain since that time.

CT ABDOMEN AND PELVIS WITH CONTRAST
TECHNIQUE: Multidetector CT imaging of the abdomen and pelvis was
performed following the standard protocol during bolus
administration of intravenous contrast.
Contrast: 100 ml Dmnipaque-BVV IV.  Oral contrast was also
administered.

[Series 2: abd_pel_with 5.0 b40f · axial · 0.84mm/px · z∈[-479,-34]mm · 13 of 99 slices shown, 15 images]
[im 5/99  soft-tissue]
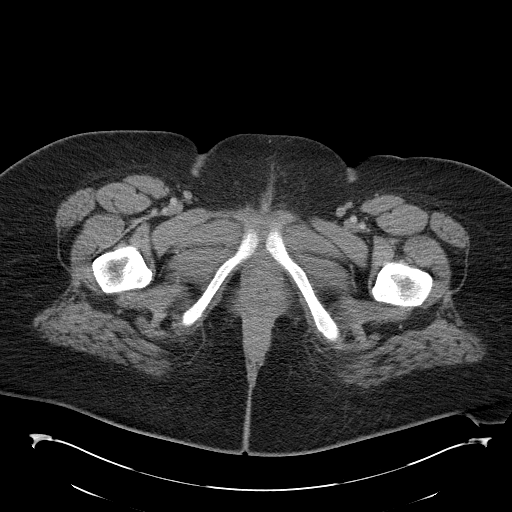
[im 5/99  bone]
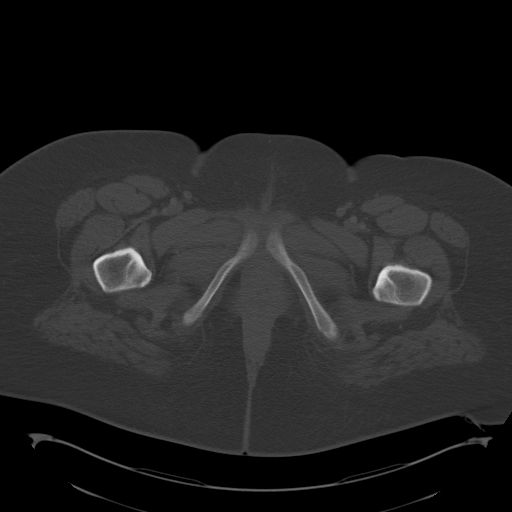
[im 15/99  soft-tissue]
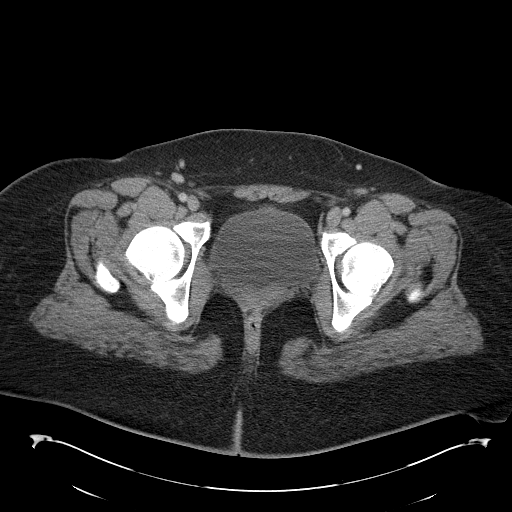
[im 19/99  soft-tissue]
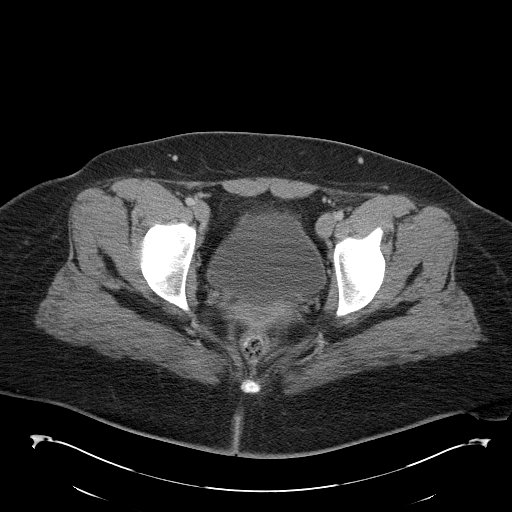
[im 29/99  soft-tissue]
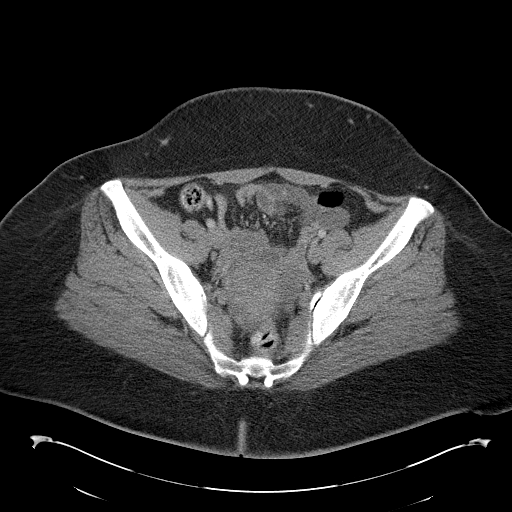
[im 33/99  soft-tissue]
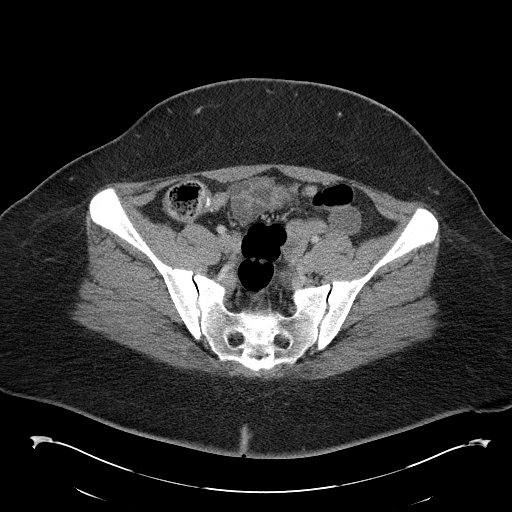
[im 43/99  soft-tissue]
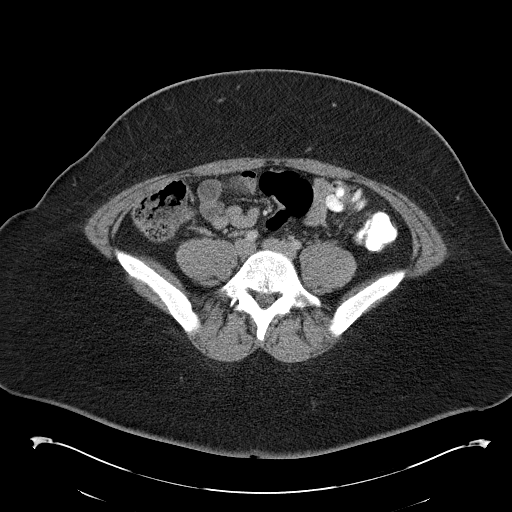
[im 52/99  soft-tissue]
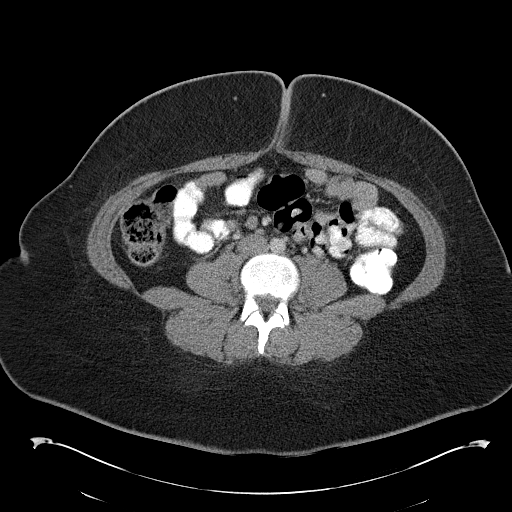
[im 57/99  soft-tissue]
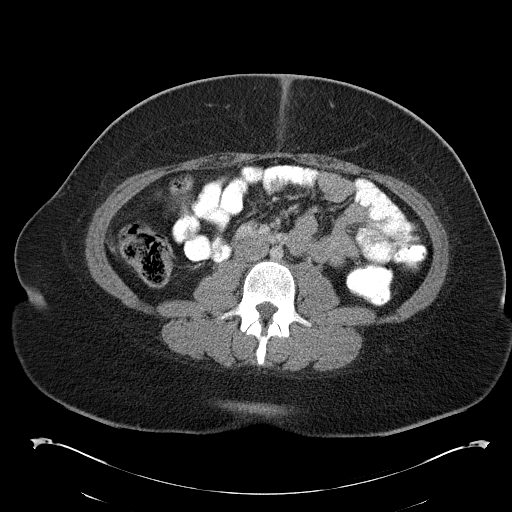
[im 66/99  soft-tissue]
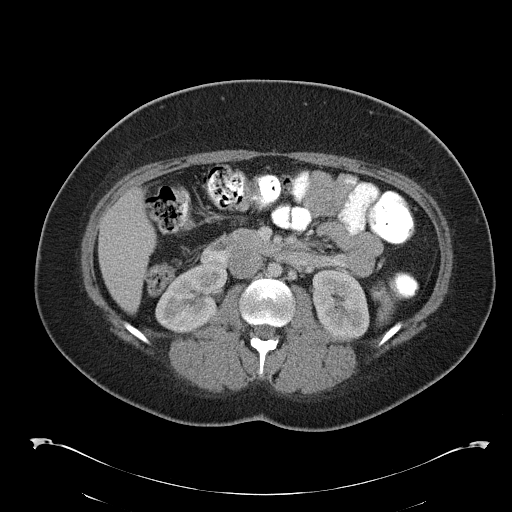
[im 66/99  bone]
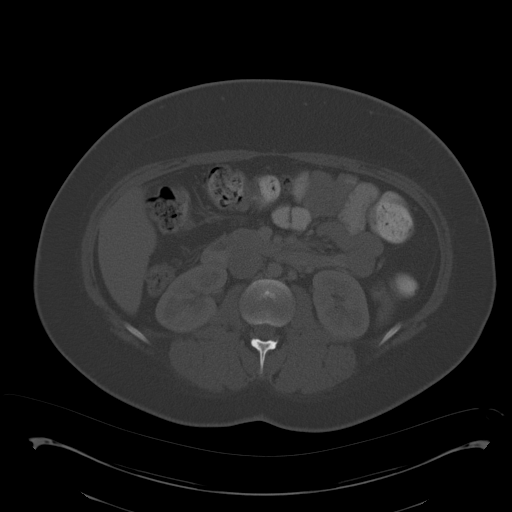
[im 71/99  soft-tissue]
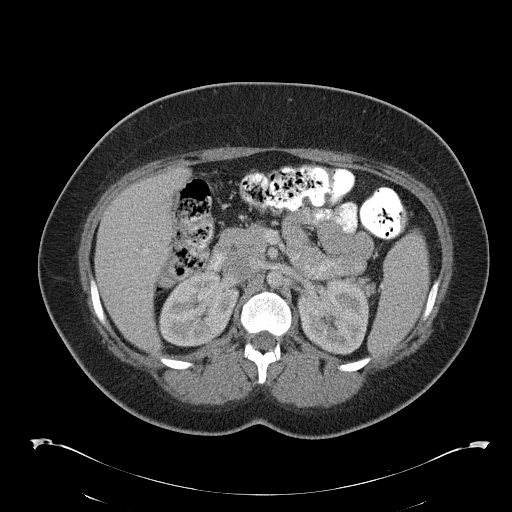
[im 80/99  soft-tissue]
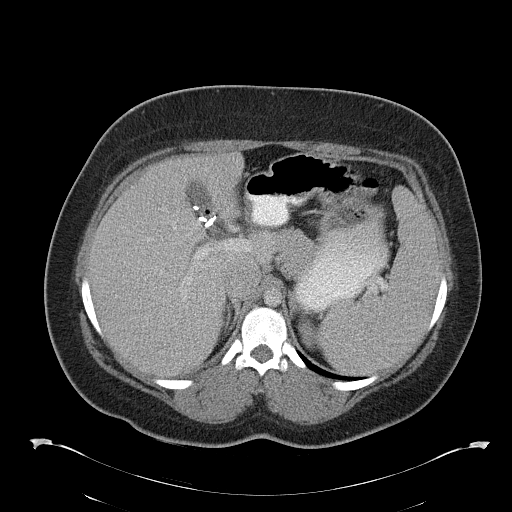
[im 85/99  soft-tissue]
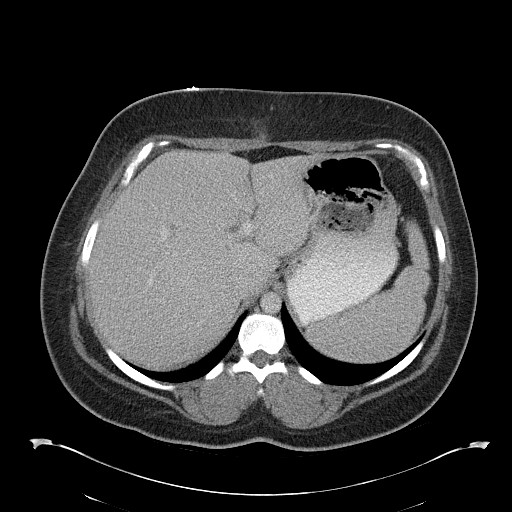
[im 94/99  soft-tissue]
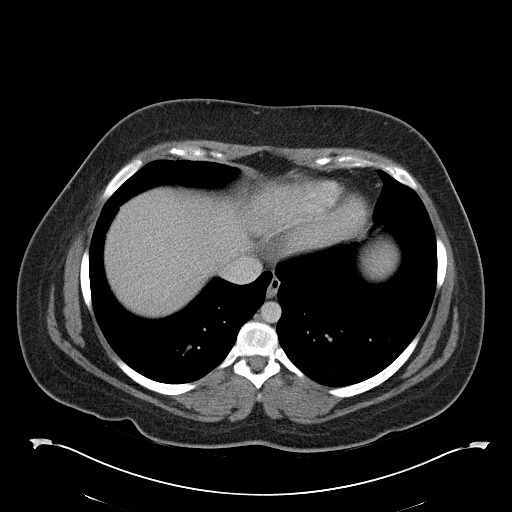

[Series 4: abd_pel_with 3.0 spo cor · coronal · 0.82mm/px · 3 of 93 slices shown]
[im 31/93  soft-tissue]
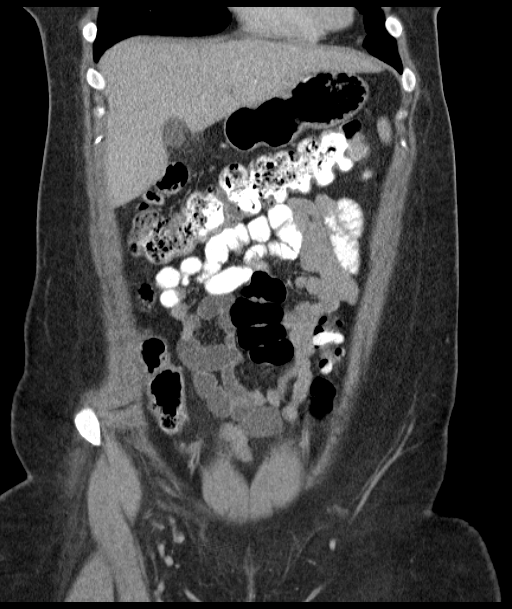
[im 41/93  soft-tissue]
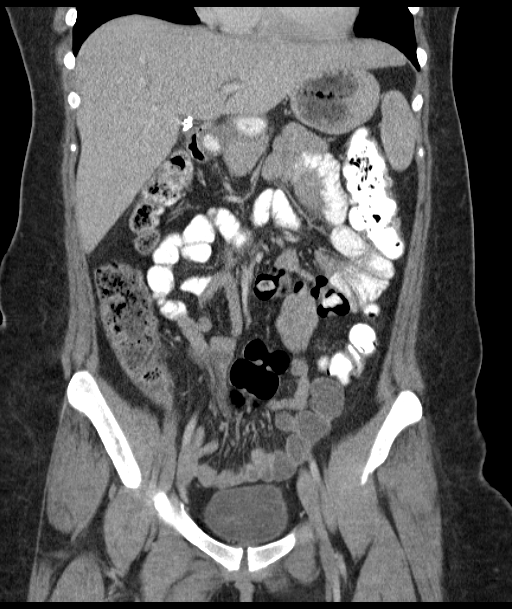
[im 52/93  soft-tissue]
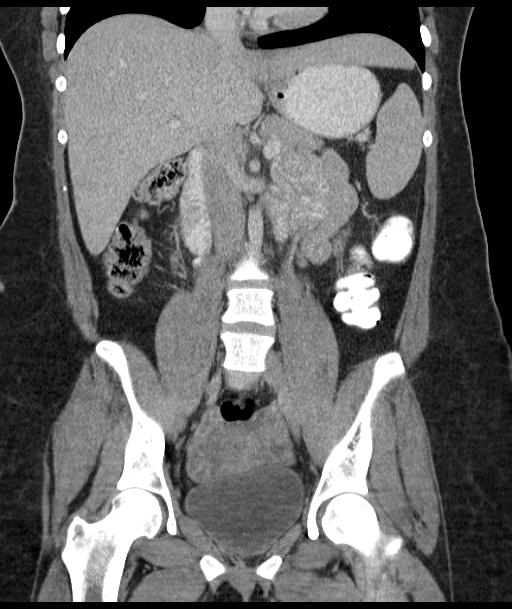

[16 of 46 positions shown; findings below may reference images not displayed]

FINDINGS: Interval cholecystectomy.  Small amount of fluid and gas
bubbles in the gallbladder fossa/cholecystectomy bed, not
unexpected 5 days post-operatively.  No biliary ductal dilation.

Normal-appearing liver with an anatomic variant in that the left
lobe extends well across the midline into the left upper quadrant.
Borderline to mild splenomegaly, the spleen measuring approximately
15.5 x 4.2 x 13.2 cm, yielding a volume of approximately 430 ml.
Focus of accessory splenic tissue medial to the lower pole of the
spleen.  No focal splenic parenchymal abnormality.

Normal adrenal glands and kidneys. Normal vascular structures.  No
significant lymphadenopathy.

Normal-appearing stomach, small bowel, and colon.  Normal appendix
in the right mid pelvis, filled with oral contrast.  No ascites.

Uterus and ovaries unremarkable by CT.  Urinary bladder
unremarkable.

Bone window images demonstrate Schmorl's nodes in multiple thoracic
and lumbar vertebrae.  Visualized lung bases clear.  Heart size
normal.
IMPRESSION: 1.  Small amount of fluid and gas bubbles in the gallbladder fossa
in the cholecystectomy bed, not unexpected 5 days out from surgery.
No biliary ductal dilation.
2.  Borderline to mild splenomegaly without focal splenic
parenchymal abnormality.

## 2014-08-09 ENCOUNTER — Encounter: Payer: Self-pay | Admitting: *Deleted

## 2014-08-09 ENCOUNTER — Other Ambulatory Visit: Payer: BC Managed Care – PPO | Admitting: Adult Health

## 2014-12-01 ENCOUNTER — Inpatient Hospital Stay (HOSPITAL_COMMUNITY)
Admission: AD | Admit: 2014-12-01 | Discharge: 2014-12-01 | Discharge status: Against Medical Advice | Payer: Medicaid Other | Source: Ambulatory Visit | Attending: Obstetrics and Gynecology | Admitting: Obstetrics and Gynecology

## 2014-12-01 ENCOUNTER — Ambulatory Visit: Payer: BC Managed Care – PPO | Admitting: Adult Health

## 2014-12-01 DIAGNOSIS — R109 Unspecified abdominal pain: Secondary | ICD-10-CM | POA: Diagnosis present

## 2014-12-01 DIAGNOSIS — Z5320 Procedure and treatment not carried out because of patient's decision for unspecified reasons: Secondary | ICD-10-CM | POA: Diagnosis not present

## 2014-12-01 DIAGNOSIS — O209 Hemorrhage in early pregnancy, unspecified: Secondary | ICD-10-CM

## 2014-12-01 LAB — URINALYSIS, ROUTINE W REFLEX MICROSCOPIC
BILIRUBIN URINE: NEGATIVE
Glucose, UA: NEGATIVE mg/dL
Hgb urine dipstick: NEGATIVE
KETONES UR: NEGATIVE mg/dL
Leukocytes, UA: NEGATIVE
Nitrite: NEGATIVE
PH: 8.5 — AB (ref 5.0–8.0)
PROTEIN: NEGATIVE mg/dL
Specific Gravity, Urine: 1.02 (ref 1.005–1.030)
Urobilinogen, UA: 0.2 mg/dL (ref 0.0–1.0)

## 2014-12-01 LAB — COMPREHENSIVE METABOLIC PANEL
ALK PHOS: 41 U/L (ref 38–126)
ALT: 30 U/L (ref 14–54)
AST: 25 U/L (ref 15–41)
Albumin: 4.5 g/dL (ref 3.5–5.0)
Anion gap: 7 (ref 5–15)
BUN: 7 mg/dL (ref 6–20)
CALCIUM: 9.4 mg/dL (ref 8.9–10.3)
CO2: 25 mmol/L (ref 22–32)
CREATININE: 0.7 mg/dL (ref 0.44–1.00)
Chloride: 105 mmol/L (ref 101–111)
GFR calc Af Amer: >60 mL/min (ref >60)
Glucose, Bld: 97 mg/dL (ref 65–99)
Potassium: 3.8 mmol/L (ref 3.5–5.1)
SODIUM: 137 mmol/L (ref 135–145)
TOTAL PROTEIN: 7.3 g/dL (ref 6.5–8.1)
Total Bilirubin: 0.5 mg/dL (ref 0.3–1.2)

## 2014-12-01 LAB — CBC
HCT: 39.6 % (ref 36.0–46.0)
Hemoglobin: 13.1 g/dL (ref 12.0–15.0)
MCH: 27.1 pg (ref 26.0–34.0)
MCHC: 33.1 g/dL (ref 30.0–36.0)
MCV: 82 fL (ref 78.0–100.0)
Platelets: 196 10*3/uL (ref 150–400)
RBC: 4.83 MIL/uL (ref 3.87–5.11)
RDW: 14.2 % (ref 11.5–15.5)
WBC: 7 10*3/uL (ref 4.0–10.5)

## 2014-12-01 LAB — HCG, QUANTITATIVE, PREGNANCY: HCG, BETA CHAIN, QUANT, S: 8794 m[IU]/mL — AB (ref <5)

## 2014-12-01 LAB — POCT PREGNANCY, URINE: Preg Test, Ur: POSITIVE — AB

## 2014-12-01 NOTE — MAU Note (Signed)
Pt not in lobby.  

## 2014-12-01 NOTE — MAU Note (Signed)
Pt reports she had a positive HPT  . Woke up and had blood in her underwear and having abd cramping.

## 2014-12-02 ENCOUNTER — Telehealth: Payer: Self-pay | Admitting: *Deleted

## 2014-12-02 LAB — RPR: RPR: NONREACTIVE

## 2014-12-02 LAB — HIV ANTIBODY (ROUTINE TESTING W REFLEX): HIV SCREEN 4TH GENERATION: NONREACTIVE

## 2014-12-02 NOTE — Telephone Encounter (Signed)
Pt states was seen at [facility] and bloodwork was done was told to contact our office for those results. Pt informed of QHCG of 8794, GC/CHL pending, RPR-nonreactive, HIV-nonreactive,CBC and CMP WNL. Pt has new OB on 12/12/2013.

## 2014-12-13 ENCOUNTER — Encounter: Payer: Self-pay | Admitting: Adult Health

## 2014-12-13 ENCOUNTER — Ambulatory Visit (INDEPENDENT_AMBULATORY_CARE_PROVIDER_SITE_OTHER): Payer: Medicaid Other | Admitting: Adult Health

## 2014-12-13 VITALS — BP 102/60 | HR 84 | Ht 69.0 in | Wt 274.0 lb

## 2014-12-13 DIAGNOSIS — Z3201 Encounter for pregnancy test, result positive: Secondary | ICD-10-CM | POA: Diagnosis not present

## 2014-12-13 DIAGNOSIS — Z3490 Encounter for supervision of normal pregnancy, unspecified, unspecified trimester: Secondary | ICD-10-CM

## 2014-12-13 DIAGNOSIS — N926 Irregular menstruation, unspecified: Secondary | ICD-10-CM | POA: Diagnosis not present

## 2014-12-13 DIAGNOSIS — O3680X1 Pregnancy with inconclusive fetal viability, fetus 1: Secondary | ICD-10-CM

## 2014-12-13 HISTORY — DX: Encounter for supervision of normal pregnancy, unspecified, unspecified trimester: Z34.90

## 2014-12-13 LAB — POCT URINE PREGNANCY: Preg Test, Ur: POSITIVE — AB

## 2014-12-13 NOTE — Progress Notes (Signed)
Subjective:     Patient ID: Gregary CromerHeather E Pandya, female   DOB: 08-20-1990, 24 y.o.   MRN: [id number]  HPI [name] is a 24 year old white female in for UPT, has missed a period.  Review of Systems Patient denies any headaches, hearing loss, fatigue, blurred vision, shortness of breath, chest pain, abdominal pain, problems with bowel movements, urination, or intercourse. No joint pain or mood swings.Has trouble sleeping, wakes up at 3:30 and has some low back pain, and nausea, no vomiting Reviewed past medical,surgical, social and family history. Reviewed medications and allergies.     Objective:   Physical Exam BP 102/60 mmHg  Pulse 84  Ht 5\' 9"  (1.753 m)  Wt 274 lb (124.286 kg)  BMI 40.44 kg/m2  LMP 10/06/2014 UPT+, by LMP about 9+5 weeks but US shows her to be about 6+4 weeks, +FHM and Yolk sac seen,EDD about 08/04/15 will bring back in 1 week for dating US, medicaid form given, Skin warm and dry. Lungs: clear to ausculation bilaterally. Cardiovascular: regular rate and rhythm.   No low back point tenderness.  Assessment:     Pregnant  Size less than dates    Plan:     Return in 1 week for dating US Ok to take tylenol  Review handout on first trimester

## 2014-12-13 NOTE — Patient Instructions (Signed)
First Trimester of Pregnancy The first trimester of pregnancy is from week 1 until the end of week 12 (months 1 through 3). A week after a sperm fertilizes an egg, the egg will implant on the wall of the uterus. This embryo will begin to develop into a baby. Genes from you and your partner are forming the baby. The female genes determine whether the baby is a boy or a girl. At 6-8 weeks, the eyes and face are formed, and the heartbeat can be seen on ultrasound. At the end of 12 weeks, all the baby's organs are formed.  Now that you are pregnant, you will want to do everything you can to have a healthy baby. Two of the most important things are to get good prenatal care and to follow your health care provider's instructions. Prenatal care is all the medical care you receive before the baby's birth. This care will help prevent, find, and treat any problems during the pregnancy and childbirth. BODY CHANGES Your body goes through many changes during pregnancy. The changes vary from woman to woman.   You may gain or lose a couple of pounds at first.  You may feel sick to your stomach (nauseous) and throw up (vomit). If the vomiting is uncontrollable, call your health care provider.  You may tire easily.  You may develop headaches that can be relieved by medicines approved by your health care provider.  You may urinate more often. Painful urination may mean you have a bladder infection.  You may develop heartburn as a result of your pregnancy.  You may develop constipation because certain hormones are causing the muscles that push waste through your intestines to slow down.  You may develop hemorrhoids or swollen, bulging veins (varicose veins).  Your breasts may begin to grow larger and become tender. Your nipples may stick out more, and the tissue that surrounds them (areola) may become darker.  Your gums may bleed and may be sensitive to brushing and flossing.  Dark spots or blotches (chloasma,  mask of pregnancy) may develop on your face. This will likely fade after the baby is born.  Your menstrual periods will stop.  You may have a loss of appetite.  You may develop cravings for certain kinds of food.  You may have changes in your emotions from day to day, such as being excited to be pregnant or being concerned that something may go wrong with the pregnancy and baby.  You may have more vivid and strange dreams.  You may have changes in your hair. These can include thickening of your hair, rapid growth, and changes in texture. Some women also have hair loss during or after pregnancy, or hair that feels dry or thin. Your hair will most likely return to normal after your baby is born. WHAT TO EXPECT AT YOUR PRENATAL VISITS During a routine prenatal visit:  You will be weighed to make sure you and the baby are growing normally.  Your blood pressure will be taken.  Your abdomen will be measured to track your baby's growth.  The fetal heartbeat will be listened to starting around week 10 or 12 of your pregnancy.  Test results from any previous visits will be discussed. Your health care provider may ask you:  How you are feeling.  If you are feeling the baby move.  If you have had any abnormal symptoms, such as leaking fluid, bleeding, severe headaches, or abdominal cramping.  If you have any questions. Other tests   that may be performed during your first trimester include:  Blood tests to find your blood type and to check for the presence of any previous infections. They will also be used to check for low iron levels (anemia) and Rh antibodies. Later in the pregnancy, blood tests for diabetes will be done along with other tests if problems develop.  Urine tests to check for infections, diabetes, or protein in the urine.  An ultrasound to confirm the proper growth and development of the baby.  An amniocentesis to check for possible genetic problems.  Fetal screens for  spina bifida and Down syndrome.  You may need other tests to make sure you and the baby are doing well. HOME CARE INSTRUCTIONS  Medicines  Follow your health care provider's instructions regarding medicine use. Specific medicines may be either safe or unsafe to take during pregnancy.  Take your prenatal vitamins as directed.  If you develop constipation, try taking a stool softener if your health care provider approves. Diet  Eat regular, well-balanced meals. Choose a variety of foods, such as meat or vegetable-based protein, fish, milk and low-fat dairy products, vegetables, fruits, and whole grain breads and cereals. Your health care provider will help you determine the amount of weight gain that is right for you.  Avoid raw meat and uncooked cheese. These carry germs that can cause birth defects in the baby.  Eating four or five small meals rather than three large meals a day may help relieve nausea and vomiting. If you start to feel nauseous, eating a few soda crackers can be helpful. Drinking liquids between meals instead of during meals also seems to help nausea and vomiting.  If you develop constipation, eat more high-fiber foods, such as fresh vegetables or fruit and whole grains. Drink enough fluids to keep your urine clear or pale yellow. Activity and Exercise  Exercise only as directed by your health care provider. Exercising will help you:  Control your weight.  Stay in shape.  Be prepared for labor and delivery.  Experiencing pain or cramping in the lower abdomen or low back is a good sign that you should stop exercising. Check with your health care provider before continuing normal exercises.  Try to avoid standing for long periods of time. Move your legs often if you must stand in one place for a long time.  Avoid heavy lifting.  Wear low-heeled shoes, and practice good posture.  You may continue to have sex unless your health care provider directs you  otherwise. Relief of Pain or Discomfort  Wear a good support bra for breast tenderness.   Take warm sitz baths to soothe any pain or discomfort caused by hemorrhoids. Use hemorrhoid cream if your health care provider approves.   Rest with your legs elevated if you have leg cramps or low back pain.  If you develop varicose veins in your legs, wear support hose. Elevate your feet for 15 minutes, 3-4 times a day. Limit salt in your diet. Prenatal Care  Schedule your prenatal visits by the twelfth week of pregnancy. They are usually scheduled monthly at first, then more often in the last 2 months before delivery.  Write down your questions. Take them to your prenatal visits.  Keep all your prenatal visits as directed by your health care provider. Safety  Wear your seat belt at all times when driving.  Make a list of emergency phone numbers, including numbers for family, friends, the hospital, and police and fire departments. General Tips    Ask your health care provider for a referral to a local prenatal education class. Begin classes no later than at the beginning of month 6 of your pregnancy.  Ask for help if you have counseling or nutritional needs during pregnancy. Your health care provider can offer advice or refer you to specialists for help with various needs.  Do not use hot tubs, steam rooms, or saunas.  Do not douche or use tampons or scented sanitary pads.  Do not cross your legs for long periods of time.  Avoid cat litter boxes and soil used by cats. These carry germs that can cause birth defects in the baby and possibly loss of the fetus by miscarriage or stillbirth.  Avoid all smoking, herbs, alcohol, and medicines not prescribed by your health care provider. Chemicals in these affect the formation and growth of the baby.  Schedule a dentist appointment. At home, brush your teeth with a soft toothbrush and be gentle when you floss. SEEK MEDICAL CARE IF:   You have  dizziness.  You have mild pelvic cramps, pelvic pressure, or nagging pain in the abdominal area.  You have persistent nausea, vomiting, or diarrhea.  You have a bad smelling vaginal discharge.  You have pain with urination.  You notice increased swelling in your face, hands, legs, or ankles. SEEK IMMEDIATE MEDICAL CARE IF:   You have a fever.  You are leaking fluid from your vagina.  You have spotting or bleeding from your vagina.  You have severe abdominal cramping or pain.  You have rapid weight gain or loss.  You vomit blood or material that looks like coffee grounds.  You are exposed to German measles and have never had them.  You are exposed to fifth disease or chickenpox.  You develop a severe headache.  You have shortness of breath.  You have any kind of trauma, such as from a fall or a car accident. Document Released: 05/21/2001 Document Revised: 10/11/2013 Document Reviewed: 04/06/2013 Exit[employer] Patient Information 2015 [employer]. This information is not intended to replace advice given to you by your health care provider. Make sure you discuss any questions you have with your health care provider. Return in 1 week for US 

## 2014-12-19 ENCOUNTER — Ambulatory Visit (INDEPENDENT_AMBULATORY_CARE_PROVIDER_SITE_OTHER): Payer: Medicaid Other

## 2014-12-19 DIAGNOSIS — O3680X1 Pregnancy with inconclusive fetal viability, fetus 1: Secondary | ICD-10-CM

## 2014-12-19 NOTE — Progress Notes (Signed)
US 8+3wks single IUP w/ys,pos fht 162 bpm,rt corpus luteal cyst 2.5 x 2.3 x 2.2cm,normal lt ov,crl 20.332mm

## 2014-12-28 ENCOUNTER — Encounter: Payer: Medicaid Other | Admitting: Advanced Practice Midwife

## 2014-12-28 ENCOUNTER — Encounter: Payer: Self-pay | Admitting: Advanced Practice Midwife

## 2015-01-30 ENCOUNTER — Telehealth: Payer: Self-pay | Admitting: Adult Health

## 2015-01-30 MED ORDER — ACYCLOVIR 400 MG PO TABS
400.0000 mg | ORAL_TABLET | Freq: Every day | ORAL | Status: DC
Start: 2015-01-30 — End: 2015-05-15

## 2015-01-30 NOTE — Telephone Encounter (Signed)
Spoke with pt. Pt needs a refill on Acyclovir. Having a breakout. It started Wednesday. Pt has an appt this coming Wednesday, but she can't wait that long. Thanks!! JSY

## 2015-01-30 NOTE — Telephone Encounter (Signed)
Will refill acyclovir ha HSV outbreak

## 2015-02-01 ENCOUNTER — Encounter: Payer: Medicaid Other | Admitting: Advanced Practice Midwife

## 2015-02-15 ENCOUNTER — Ambulatory Visit (INDEPENDENT_AMBULATORY_CARE_PROVIDER_SITE_OTHER): Payer: Medicaid Other | Admitting: Advanced Practice Midwife

## 2015-02-15 ENCOUNTER — Encounter: Payer: Self-pay | Admitting: Advanced Practice Midwife

## 2015-02-15 ENCOUNTER — Other Ambulatory Visit (HOSPITAL_COMMUNITY)
Admission: RE | Admit: 2015-02-15 | Discharge: 2015-02-15 | Disposition: A | Discharge status: Home / Self Care | Payer: Medicaid Other | Source: Ambulatory Visit | Attending: Advanced Practice Midwife | Admitting: Advanced Practice Midwife

## 2015-02-15 VITALS — BP 100/52 | HR 96 | Wt 273.0 lb

## 2015-02-15 DIAGNOSIS — O0932 Supervision of pregnancy with insufficient antenatal care, second trimester: Secondary | ICD-10-CM

## 2015-02-15 DIAGNOSIS — Z3492 Encounter for supervision of normal pregnancy, unspecified, second trimester: Secondary | ICD-10-CM

## 2015-02-15 DIAGNOSIS — Z331 Pregnant state, incidental: Secondary | ICD-10-CM

## 2015-02-15 DIAGNOSIS — Z01411 Encounter for gynecological examination (general) (routine) with abnormal findings: Secondary | ICD-10-CM | POA: Insufficient documentation

## 2015-02-15 DIAGNOSIS — Z1389 Encounter for screening for other disorder: Secondary | ICD-10-CM

## 2015-02-15 DIAGNOSIS — Z3682 Encounter for antenatal screening for nuchal translucency: Secondary | ICD-10-CM

## 2015-02-15 DIAGNOSIS — Z363 Encounter for antenatal screening for malformations: Secondary | ICD-10-CM

## 2015-02-15 DIAGNOSIS — Z3490 Encounter for supervision of normal pregnancy, unspecified, unspecified trimester: Secondary | ICD-10-CM | POA: Insufficient documentation

## 2015-02-15 DIAGNOSIS — Z124 Encounter for screening for malignant neoplasm of cervix: Secondary | ICD-10-CM

## 2015-02-15 DIAGNOSIS — Z113 Encounter for screening for infections with a predominantly sexual mode of transmission: Secondary | ICD-10-CM | POA: Insufficient documentation

## 2015-02-15 DIAGNOSIS — Z369 Encounter for antenatal screening, unspecified: Secondary | ICD-10-CM

## 2015-02-15 DIAGNOSIS — O0930 Supervision of pregnancy with insufficient antenatal care, unspecified trimester: Secondary | ICD-10-CM | POA: Insufficient documentation

## 2015-02-15 DIAGNOSIS — Z0283 Encounter for blood-alcohol and blood-drug test: Secondary | ICD-10-CM

## 2015-02-15 LAB — POCT URINALYSIS DIPSTICK
Blood, UA: NEGATIVE
GLUCOSE UA: NEGATIVE
Ketones, UA: NEGATIVE
Leukocytes, UA: NEGATIVE
NITRITE UA: NEGATIVE
Protein, UA: NEGATIVE

## 2015-02-15 NOTE — Progress Notes (Signed)
  Subjective:    Brittney Nicholson is a G3P1001 [redacted]w[redacted]d being seen today for her first obstetrical visit.  Her obstetrical history is significant for nothing.  .  Pregnancy history fully reviewed.  Patient reports thick white discharge.  with intense itching.  Tried OTC with some relief, now back  Filed Vitals:   02/15/15 1434  BP: 100/52  Pulse: 96  Weight: 273 lb (123.832 kg)    HISTORY: OB History  Gravida Para Term Preterm AB SAB TAB Ectopic Multiple Living  # Outcome Date GA Lbr Len/2nd Weight Sex Delivery Anes PTL Lv  3 Current           2 Term 11/08/12 [redacted]w[redacted]d 01:36 / 14:15 8 lb 14.3 oz (4.034 kg) F Vag-Spont EPI  Y     Comments: WNL  1 Gravida              Comments: System Generated. Please review and update pregnancy details.     Past Medical History  Diagnosis Date  . HSV-2 infection complicating pregnancy   . Warts   . 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 Pregnant 12/13/2014   Past Surgical History  Procedure Laterality Date  . Tonsillectomy    . Tonsillectomy  2011  . Right arm Right     has pins.  . Cholecystectomy N/A 01/25/2013    Procedure: LAPAROSCOPIC CHOLECYSTECTOMY;  Surgeon: Dalia Heading, MD;  Location: [facility];  Service: General;  Laterality: N/A;  . Breast lumpectomy  2006   Family History  Problem Relation Age of Onset  . Cancer Mother     uterine  . Diabetes Father   . Kidney failure Father      Exam       Pelvic Exam:    Perineum: Normal Perineum   Vulva: normal   Vagina:  normal mucosa,copious discharge C/W yeast.  Entire vaginal painted with gential violet   Uterus Normal, Gravid, FH: 16     Cervix: normal   Adnexa: Not palpable   Urinary:  urethral meatus normal    System:     Skin:16 normal coloration and turgor, no rashes    Neurologic: oriented, normal, normal mood   Extremities: normal strength, tone, and muscle mass   HEENT PERRLA   Mouth/Teeth mucous membranes moist, normal dentition   Neck supple and no  masses   Cardiovascular: regular rate and rhythm   Respiratory:  appears well, vitals normal, no respiratory distress, acyanotic   Abdomen: soft, non-tender;  FHR: 145          Assessment:    Pregnancy: G3P1001 Patient Active Problem List   Diagnosis Date Noted  . Late prenatal care 02/15/2015  . Supervision of normal pregnancy 02/15/2015  . Pregnant 12/13/2014  . 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 Acute cholecystitis 01/25/2013  . Warts 08/18/2012        Plan:     Initial labs drawn. Continue prenatal vitamins  Problem list reviewed and updated  Get OTC yeast meds  Reviewed recommended weight gain based on pre-gravid BMI  Encouraged well-balanced diet Genetic Screening discussed Quad Screen: requested.  Ultrasound discussed; fetal survey: requested.  Follow up in 3 weeks for anatomy scan.  CRESENZO-DISHMAN,John Williamsen 02/15/2015

## 2015-02-17 LAB — CYTOLOGY - PAP

## 2015-03-10 ENCOUNTER — Encounter: Payer: Self-pay | Admitting: Obstetrics & Gynecology

## 2015-03-10 ENCOUNTER — Ambulatory Visit (INDEPENDENT_AMBULATORY_CARE_PROVIDER_SITE_OTHER): Payer: Medicaid Other | Admitting: Obstetrics & Gynecology

## 2015-03-10 ENCOUNTER — Ambulatory Visit (INDEPENDENT_AMBULATORY_CARE_PROVIDER_SITE_OTHER): Payer: Medicaid Other

## 2015-03-10 VITALS — BP 98/50 | HR 76 | Wt 278.0 lb

## 2015-03-10 DIAGNOSIS — Z1389 Encounter for screening for other disorder: Secondary | ICD-10-CM

## 2015-03-10 DIAGNOSIS — Z363 Encounter for antenatal screening for malformations: Secondary | ICD-10-CM

## 2015-03-10 DIAGNOSIS — Z36 Encounter for antenatal screening of mother: Secondary | ICD-10-CM

## 2015-03-10 DIAGNOSIS — Z3492 Encounter for supervision of normal pregnancy, unspecified, second trimester: Secondary | ICD-10-CM

## 2015-03-10 DIAGNOSIS — Z331 Pregnant state, incidental: Secondary | ICD-10-CM

## 2015-03-10 LAB — POCT URINALYSIS DIPSTICK
Blood, UA: NEGATIVE
Glucose, UA: NEGATIVE
Ketones, UA: NEGATIVE
Nitrite, UA: NEGATIVE
Protein, UA: NEGATIVE

## 2015-03-10 NOTE — Progress Notes (Signed)
G2P1001 [redacted]w[redacted]d Estimated Date of Delivery: 07/28/15  Blood pressure 98/50, pulse 76, weight 278 lb (126.1 kg), last menstrual period 10/06/2014.   BP weight and urine results all reviewed and noted.  Please refer to the obstetrical flow sheet for the fundal height and fetal heart rate documentation:  Patient reports good fetal movement, denies any bleeding and no rupture of membranes symptoms or regular contractions. Patient is without complaints. All questions were answered.  Orders Placed This Encounter  Procedures  . POCT urinalysis dipstick    Plan:  Continued routine obstetrical care,   No Follow-up on file.

## 2015-03-10 NOTE — Progress Notes (Addendum)
[place] 20wks measurements c/w dates,efw 348 g,svp of fluid 4.6cm,normal ov's bilat,ant pl gr 0,cephalic,cx 3.3cm,fhr 148 bpm,anatomy complete,no obvious abn seen

## 2015-04-07 ENCOUNTER — Encounter: Payer: Self-pay | Admitting: Obstetrics and Gynecology

## 2015-04-07 ENCOUNTER — Encounter: Payer: Medicaid Other | Admitting: Obstetrics and Gynecology

## 2015-04-24 ENCOUNTER — Encounter: Payer: Medicaid Other | Admitting: Obstetrics and Gynecology

## 2015-04-24 ENCOUNTER — Encounter: Payer: Self-pay | Admitting: *Deleted

## 2015-05-11 ENCOUNTER — Encounter: Payer: Medicaid Other | Admitting: Obstetrics and Gynecology

## 2015-05-15 ENCOUNTER — Ambulatory Visit (INDEPENDENT_AMBULATORY_CARE_PROVIDER_SITE_OTHER): Payer: Medicaid Other | Admitting: Obstetrics and Gynecology

## 2015-05-15 ENCOUNTER — Encounter: Payer: Self-pay | Admitting: Obstetrics and Gynecology

## 2015-05-15 VITALS — BP 110/70 | HR 98 | Wt 289.0 lb

## 2015-05-15 DIAGNOSIS — Z3482 Encounter for supervision of other normal pregnancy, second trimester: Secondary | ICD-10-CM

## 2015-05-15 DIAGNOSIS — Z3A29 29 weeks gestation of pregnancy: Secondary | ICD-10-CM

## 2015-05-15 DIAGNOSIS — Z1389 Encounter for screening for other disorder: Secondary | ICD-10-CM

## 2015-05-15 DIAGNOSIS — Z331 Pregnant state, incidental: Secondary | ICD-10-CM

## 2015-05-15 LAB — POCT URINALYSIS DIPSTICK
GLUCOSE UA: NEGATIVE
Ketones, UA: NEGATIVE
LEUKOCYTES UA: NEGATIVE
NITRITE UA: NEGATIVE
Protein, UA: NEGATIVE
RBC UA: NEGATIVE

## 2015-05-15 NOTE — Progress Notes (Signed)
Patient ID: Brittney CromerHeather E Nicholson, female   DOB: [birth year], 24 y.o.   MRN: [id number] G2P1001 346w3d Estimated Date of Delivery: 07/28/15  Blood pressure 110/70, pulse 98, weight 289 lb (131.09 kg), last menstrual period 10/06/2014.   refer to the ob flow sheet for FH and FHR, also BP, Wt, Urine results: negative   Patient reports  + good fetal movement, denies any bleeding and no rupture of membranes symptoms or regular contractions. Patient complaints: Pt has no complaints today.  FH: 31cm FHR: 144  Questions were answered. Assessment:  1. LROB G2P1001 @ 5446w3d  2.   Plan:   1. Continued routine obstetrical care 2. Glucose screening test  3. F/u in 1 weeks for glucose screening test  4. F/u in 4 weeks for routine obstetrical care   By signing my name below, I, Marica OtterNusrat Rahman, attest that this documentation has been prepared under the direction and in the presence of Christin BachJohn Shakeyla Giebler, MD. Electronically Signed: Marica OtterNusrat Rahman, ED Scribe. 05/15/2015. 10:23 AM.   I personally performed the services described in this documentation, which was SCRIBED in my presence. The recorded information has been reviewed and considered accurate. It has been edited as necessary during review. Tilda BurrowFERGUSON,Arcola Freshour V, MD

## 2015-05-15 NOTE — Progress Notes (Signed)
Pt denies any problems or concerns at this time.  

## 2015-05-22 ENCOUNTER — Other Ambulatory Visit: Payer: Medicaid Other

## 2015-06-06 ENCOUNTER — Other Ambulatory Visit: Payer: Medicaid Other

## 2015-06-06 ENCOUNTER — Encounter: Payer: Self-pay | Admitting: *Deleted

## 2015-06-07 ENCOUNTER — Emergency Department (HOSPITAL_COMMUNITY)
Admission: EM | Admit: 2015-06-07 | Discharge: 2015-06-07 | Disposition: A | Discharge status: Home / Self Care | Payer: Medicaid Other | Attending: Emergency Medicine | Admitting: Emergency Medicine

## 2015-06-07 ENCOUNTER — Encounter (HOSPITAL_COMMUNITY): Payer: Self-pay | Admitting: Emergency Medicine

## 2015-06-07 DIAGNOSIS — R1031 Right lower quadrant pain: Secondary | ICD-10-CM | POA: Diagnosis not present

## 2015-06-07 DIAGNOSIS — Z3A33 33 weeks gestation of pregnancy: Secondary | ICD-10-CM | POA: Diagnosis not present

## 2015-06-07 DIAGNOSIS — Z79899 Other long term (current) drug therapy: Secondary | ICD-10-CM | POA: Insufficient documentation

## 2015-06-07 DIAGNOSIS — Z8619 Personal history of other infectious and parasitic diseases: Secondary | ICD-10-CM | POA: Insufficient documentation

## 2015-06-07 DIAGNOSIS — R000 Tachycardia, unspecified: Secondary | ICD-10-CM | POA: Insufficient documentation

## 2015-06-07 DIAGNOSIS — R1030 Lower abdominal pain, unspecified: Secondary | ICD-10-CM

## 2015-06-07 DIAGNOSIS — O9989 Other specified diseases and conditions complicating pregnancy, childbirth and the puerperium: Secondary | ICD-10-CM | POA: Insufficient documentation

## 2015-06-07 DIAGNOSIS — O26899 Other specified pregnancy related conditions, unspecified trimester: Secondary | ICD-10-CM

## 2015-06-07 LAB — CBC WITH DIFFERENTIAL/PLATELET
Basophils Absolute: 0 10*3/uL (ref 0.0–0.1)
Basophils Relative: 0 %
EOS ABS: 0.2 10*3/uL (ref 0.0–0.7)
Eosinophils Relative: 2 %
HEMATOCRIT: 32.2 % — AB (ref 36.0–46.0)
HEMOGLOBIN: 10.6 g/dL — AB (ref 12.0–15.0)
LYMPHS ABS: 1.4 10*3/uL (ref 0.7–4.0)
Lymphocytes Relative: 12 %
MCH: 26.5 pg (ref 26.0–34.0)
MCHC: 32.9 g/dL (ref 30.0–36.0)
MCV: 80.5 fL (ref 78.0–100.0)
MONO ABS: 0.8 10*3/uL (ref 0.1–1.0)
MONOS PCT: 7 %
NEUTROS PCT: 79 %
Neutro Abs: 8.9 10*3/uL — ABNORMAL HIGH (ref 1.7–7.7)
Platelets: 127 10*3/uL — ABNORMAL LOW (ref 150–400)
RBC: 4 MIL/uL (ref 3.87–5.11)
RDW: 13.1 % (ref 11.5–15.5)
WBC: 11.3 10*3/uL — ABNORMAL HIGH (ref 4.0–10.5)

## 2015-06-07 LAB — URINALYSIS, ROUTINE W REFLEX MICROSCOPIC
BILIRUBIN URINE: NEGATIVE
GLUCOSE, UA: NEGATIVE mg/dL
HGB URINE DIPSTICK: NEGATIVE
KETONES UR: NEGATIVE mg/dL
Nitrite: NEGATIVE
PH: 6 (ref 5.0–8.0)
PROTEIN: NEGATIVE mg/dL
Specific Gravity, Urine: 1.02 (ref 1.005–1.030)

## 2015-06-07 LAB — WET PREP, GENITAL
Clue Cells Wet Prep HPF POC: NONE SEEN
SPERM: NONE SEEN
Trich, Wet Prep: NONE SEEN
YEAST WET PREP: NONE SEEN

## 2015-06-07 LAB — TYPE AND SCREEN
ABO/RH(D): O POS
Antibody Screen: NEGATIVE

## 2015-06-07 LAB — COMPREHENSIVE METABOLIC PANEL
ALK PHOS: 91 U/L (ref 38–126)
ALT: 12 U/L — AB (ref 14–54)
AST: 16 U/L (ref 15–41)
Albumin: 2.9 g/dL — ABNORMAL LOW (ref 3.5–5.0)
Anion gap: 9 (ref 5–15)
BILIRUBIN TOTAL: 0.3 mg/dL (ref 0.3–1.2)
BUN: 7 mg/dL (ref 6–20)
CALCIUM: 9.1 mg/dL (ref 8.9–10.3)
CHLORIDE: 106 mmol/L (ref 101–111)
CO2: 22 mmol/L (ref 22–32)
CREATININE: 0.48 mg/dL (ref 0.44–1.00)
GFR calc Af Amer: >60 mL/min (ref >60)
Glucose, Bld: 111 mg/dL — ABNORMAL HIGH (ref 65–99)
Potassium: 3.5 mmol/L (ref 3.5–5.1)
Sodium: 137 mmol/L (ref 135–145)
TOTAL PROTEIN: 6 g/dL — AB (ref 6.5–8.1)

## 2015-06-07 LAB — URINE MICROSCOPIC-ADD ON: RBC / HPF: NONE SEEN RBC/hpf (ref 0–5)

## 2015-06-07 MED ORDER — ACETAMINOPHEN ER 650 MG PO TBCR: 650.0000 mg | EXTENDED_RELEASE_TABLET | Freq: Three times a day (TID) | ORAL | Status: DC | PRN

## 2015-06-07 MED ORDER — SODIUM CHLORIDE 0.9 % IV BOLUS (SEPSIS)
1000.0000 mL | Freq: Once | INTRAVENOUS | Status: AC
  Administered 2015-06-07: 1000 mL via INTRAVENOUS

## 2015-06-07 MED ORDER — HYDROCODONE-ACETAMINOPHEN 5-325 MG PO TABS
1.0000 | ORAL_TABLET | Freq: Once | ORAL | Status: AC
  Administered 2015-06-07: 1 via ORAL
  Filled 2015-06-07: qty 1

## 2015-06-07 MED ORDER — HYDROMORPHONE HCL 1 MG/ML IJ SOLN
1.0000 mg | Freq: Once | INTRAMUSCULAR | Status: AC
  Administered 2015-06-07: 1 mg via INTRAVENOUS
  Filled 2015-06-07: qty 1

## 2015-06-07 NOTE — ED Notes (Signed)
Patient states she was grocery shopping and had sudden onset of lower abdominal pain. Patient states she is [redacted] weeks pregnant at this time. Denies vaginal discharge or leaking.

## 2015-06-07 NOTE — ED Notes (Signed)
Pt walked in hallway around nurse's station x 1. Pt rates pain at 5/10 while lying and reports no increase in pain with standing or walking. Describes pain as a "pulling sensation". nad noted.

## 2015-06-07 NOTE — ED Provider Notes (Addendum)
CSN: [id number]     Arrival date & time 06/07/15  1512 History   First MD Initiated Contact with Patient 06/07/15 1541     Chief Complaint  Patient presents with  . Abdominal Pain with Pregnancy      (Consider location/radiation/quality/duration/timing/severity/associated sxs/prior Treatment) HPI Comments: G3P1 patient who is [redacted] weeks pregnant comes in with cc of abd pain. Pain started an hour ago, suddently. Pain is described as constant, lower quadrant and suprapubic pain with intermittent cramping. Pain is worst over the RLQ. Pt had nausea initially, none now. She denies trauma. Pt doesn't think the pain is similar to her contractions during has last pregnancy, but she also was induced. No water leakage.  Current pregnancy has been unremarkable thus far. No HTN. No elevated glucose.   ROS 10 Systems reviewed and are negative for acute change except as noted in the HPI.     The history is provided by the patient.    Past Medical History  Diagnosis Date  . HSV-2 infection complicating pregnancy   . Warts   . 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 Pregnant 12/13/2014   Past Surgical History  Procedure Laterality Date  . Tonsillectomy    . Tonsillectomy  2011  . Right arm Right     has pins.  . Cholecystectomy N/A 01/25/2013    Procedure: LAPAROSCOPIC CHOLECYSTECTOMY;  Surgeon: Dalia HeadingMark A Jenkins, MD;  Location: [facility];  Service: General;  Laterality: N/A;  . Breast lumpectomy  2006   Family History  Problem Relation Age of Onset  . Cancer Mother     uterine  . Diabetes Father   . Kidney failure Father    Social History  Substance Use Topics  . Smoking status: Never Smoker   . Smokeless tobacco: Never Used  . Alcohol Use: No   OB History    Gravida Para Term Preterm AB TAB SAB Ectopic Multiple Living   2 1 1  0 0 0 0 0 0 1     Review of Systems    Allergies  Gluten and Wheat  Home Medications   Prior to Admission medications   Medication Sig Start Date End Date  Taking? Authorizing Provider  Prenatal Vit-Fe Fumarate-FA (PRENATAL VITAMIN PO) Take 1 tablet by mouth daily.    Yes Historical Provider, MD  acetaminophen (TYLENOL 8 HOUR) 650 MG CR tablet Take 1 tablet (650 mg total) by mouth every 8 (eight) hours as needed for pain. 06/07/15   Milayna Rotenberg, MD   BP 107/58 mmHg  Pulse 96  Temp(Src) 98.1 F (36.7 C) (Oral)  Resp 20  Ht 5\' 9"  (1.753 m)  Wt 282 lb (127.914 kg)  BMI 41.63 kg/m2  SpO2 98%  LMP 10/06/2014 Physical Exam  Constitutional: She is oriented to person, place, and time. She appears well-developed and well-nourished.  HENT:  Head: Normocephalic and atraumatic.  Eyes: EOM are normal. Pupils are equal, round, and reactive to light.  Neck: Neck supple.  Cardiovascular: Regular rhythm and normal heart sounds.   tachycardia  Pulmonary/Chest: Effort normal. No respiratory distress.  Abdominal: Soft. She exhibits no distension. There is tenderness. There is guarding. There is no rebound.  Neurological: She is alert and oriented to person, place, and time.  Skin: Skin is warm and dry.  Nursing note and vitals reviewed.   ED Course  Procedures (including critical care time) Labs Review Labs Reviewed  WET PREP, GENITAL - Abnormal; Notable for the following:    WBC, Wet Prep HPF POC  MODERATE (*)    All other components within normal limits  CBC WITH DIFFERENTIAL/PLATELET - Abnormal; Notable for the following:    WBC 11.3 (*)    Hemoglobin 10.6 (*)    HCT 32.2 (*)    Platelets 127 (*)    Neutro Abs 8.9 (*)    All other components within normal limits  URINALYSIS, ROUTINE W REFLEX MICROSCOPIC (NOT AT [facility]) - Abnormal; Notable for the following:    Leukocytes, UA TRACE (*)    All other components within normal limits  COMPREHENSIVE METABOLIC PANEL - Abnormal; Notable for the following:    Glucose, Bld 111 (*)    Total Protein 6.0 (*)    Albumin 2.9 (*)    ALT 12 (*)    All other components within normal limits  URINE  MICROSCOPIC-ADD ON - Abnormal; Notable for the following:    Squamous Epithelial / LPF 0-5 (*)    Bacteria, UA FEW (*)    All other components within normal limits  URINE CULTURE  TYPE AND SCREEN  GC/CHLAMYDIA PROBE AMP ([place]) NOT AT [facility]    Imaging Review No results found. I have personally reviewed and evaluated these images and lab results as part of my medical decision-making.   EKG Interpretation None      MDM   Final diagnoses:  Pregnancy related abdominal pain of lower quadrant, antepartum  RLQ abdominal pain    Pt with acute abdominal pain in the lower quadrants. Pain is diffuse, worst over the RLQ. She is G3P1, and is s/p cholecystectomy.  On exam: diffuse lower quad tenderness, worst in the RLQ.  DDX: Appendicitis, Preterm labor, ligamentous/MS pain.  Uterine rupture, placental abruption, HELLP syndrome, severe pre-eclampsia considered in the ddx.   :30: Pt feels slightly better. Discussed case with the RN and MD Denny Peon and Dr. Debroah Loop) at [facility] and the tracings here are reassuring. No labor seen. Dr. Debroah Loop would recommend sterile exam. Labs pending.  : s/p pain meds. Pain is 2/10 now, pt a lot comfortable. Will continue to monitor.   :10: Pain is slowly increasing. Will give norco.  :10: s/p reassuring pelvic exam - cervical os is closed. Pelvic exam was with sterile gloves.  :30: Pt ambulated. No peritoneal signs. Tenderness worst on the right side, but not focal to one point. Pt comfortable going home. Pain is described as stretching type pain, and appendicitis at this time is deemed less likely for the sudden onset pain which hasnt changed much in the 5 hours of monitoring. Strict return precautions discussed. Pt is comfortable with the plan.   Derwood Kaplan, MD 06/07/15 1722  Derwood Kaplan, MD 06/07/15 2016

## 2015-06-07 NOTE — ED Notes (Signed)
Per OB Rapid Response Nurse pt is cleared by OB [facility]Eure [facility]

## 2015-06-07 NOTE — Discharge Instructions (Signed)
We saw you in the ER for the abdominal pain.  We are not sure what is causing your symptoms. We think the pain is from pregnancy related stretching of the muscles, however other possibilities exist.  We monitored you for extended period of time, and the OB monitoring rules out preterm contraction/labor. ALSO - the baby heart tones are reassuring. Labs dont raise any flags.  As discussed, appendicitis was other prime possibility - but we think it is less likely based on repeated exams and evaluation.  HOWEVER - return to the ER immediately if the pain gets severe, you have fevers, you can't keep any meds down.  If you have bleeding, or water breaks - go to [facility] immediately.   Round Ligament Pain The round ligament is a cord of muscle and tissue that helps to support the uterus. It can become a source of pain during pregnancy if it becomes stretched or twisted as the baby grows. The pain usually begins in the second trimester of pregnancy, and it can come and go until the baby is delivered. It is not a serious problem, and it does not cause harm to the baby. Round ligament pain is usually a short, sharp, and pinching pain, but it can also be a dull, lingering, and aching pain. The pain is felt in the lower side of the abdomen or in the groin. It usually starts deep in the groin and moves up to the outside of the hip area. Pain can occur with:  A sudden change in position.  Rolling over in bed.  Coughing or sneezing.  Physical activity. HOME CARE INSTRUCTIONS Watch your condition for any changes. Take these steps to help with your pain:  When the pain starts, relax. Then try:  Sitting down.  Flexing your knees up to your abdomen.  Lying on your side with one pillow under your abdomen and another pillow between your legs.  Sitting in a warm bath for 15-20 minutes or until the pain goes away.  Take over-the-counter and prescription medicines only as told by your health care  provider.  Move slowly when you sit and stand.  Avoid long walks if they cause pain.  Stop or lessen your physical activities if they cause pain. SEEK MEDICAL CARE IF:  Your pain does not go away with treatment.  You feel pain in your back that you did not have before.  Your medicine is not helping. SEEK IMMEDIATE MEDICAL CARE IF:  You develop a fever or chills.  You develop uterine contractions.  You develop vaginal bleeding.  You develop nausea or vomiting.  You develop diarrhea.  You have pain when you urinate.   This information is not intended to replace advice given to you by your health care provider. Make sure you discuss any questions you have with your health care provider.   Document Released: 03/05/2008 Document Revised: 08/19/2011 Document Reviewed: 08/03/2014 Elsevier Interactive Patient Education 2016 Elsevier Inc. Abdominal Pain During Pregnancy Abdominal pain is common in pregnancy. Most of the time, it does not cause harm. There are many causes of abdominal pain. Some causes are more serious than others. Some of the causes of abdominal pain in pregnancy are easily diagnosed. Occasionally, the diagnosis takes time to understand. Other times, the cause is not determined. Abdominal pain can be a sign that something is very wrong with the pregnancy, or the pain may have nothing to do with the pregnancy at all. For this reason, always tell your health care  provider if you have any abdominal discomfort. HOME CARE INSTRUCTIONS  Monitor your abdominal pain for any changes. The following actions may help to alleviate any discomfort you are experiencing:  Do not have sexual intercourse or put anything in your vagina until your symptoms go away completely.  Get plenty of rest until your pain improves.  Drink clear fluids if you feel nauseous. Avoid solid food as long as you are uncomfortable or nauseous.  Only take over-the-counter or prescription medicine as  directed by your health care provider.  Keep all follow-up appointments with your health care provider. SEEK IMMEDIATE MEDICAL CARE IF:  You are bleeding, leaking fluid, or passing tissue from the vagina.  You have increasing pain or cramping.  You have persistent vomiting.  You have painful or bloody urination.  You have a fever.  You notice a decrease in your baby's movements.  You have extreme weakness or feel faint.  You have shortness of breath, with or without abdominal pain.  You develop a severe headache with abdominal pain.  You have abnormal vaginal discharge with abdominal pain.  You have persistent diarrhea.  You have abdominal pain that continues even after rest, or gets worse. MAKE SURE YOU:   Understand these instructions.  Will watch your condition.  Will get help right away if you are not doing well or get worse.   This information is not intended to replace advice given to you by your health care provider. Make sure you discuss any questions you have with your health care provider.   Document Released: 05/27/2005 Document Revised: 03/17/2013 Document Reviewed: 12/24/2012 Elsevier Interactive Patient Education [employer] Elsevier Inc.

## 2015-06-07 NOTE — ED Notes (Signed)
Rapid Response called for monitoring she will call back for information

## 2015-06-07 NOTE — ED Notes (Signed)
This note also relates to the following rows which could not be included: Pulse Rate - Cannot attach notes to unvalidated device data SpO2 - Cannot attach notes to unvalidated device data   Monitors previously applied by ED RN.

## 2015-06-07 NOTE — Progress Notes (Signed)
1534  Received call regarding this patient at [facility] ED.  1551  Returned call to ED RN.  Per ED RN patient presents with complaints of RLQ abdominal pain with sudden onset while pushing a shopping cart.  The pain has been intermittent since that time.  Patient denies vaginal bleeding or leaking of fluid.  Patient reports good fetal movement.

## 2015-06-07 NOTE — ED Notes (Signed)
OB Rapid Response Denny Peon(Erin) monitoring patient.  Pt more calm, pain/cramping slightly improving. Continues with no n/v, or vag discharge/drainage.

## 2015-06-07 NOTE — Progress Notes (Signed)
[id number]  Spoke with Dr. Despina HiddenEure regarding this patient who is still in the ED.  Updated on labs and testing done by EDP.  Per Dr. Despina HiddenEure patient can be OB cleared once FHR tracing is adjusted and is reassuring for 10 min.

## 2015-06-09 LAB — URINE CULTURE

## 2015-06-11 NOTE — L&D Delivery Note (Signed)
Delivery Note Called to patient's room as she was actively pushing with great progress.  At 12:54 AM a viable female was delivered via Vaginal, Spontaneous Delivery (Presentation: LOA;  ).  APGAR: 8, 9; weight pending .   Placenta status: Intact, Spontaneous.  Cord: 3 vessels with the following complications: None.  Cord pH: pending. Cord blood was obtained by Cathie Beams.   Anesthesia: Epidural  Episiotomy: None Lacerations: None Suture Repair: N/A Est. Blood Loss (mL): 150  Mom to postpartum.  Baby to Couplet care / Skin to Skin.  Crystal Dorsey 07/28/2015, 1:29 AM   I was present for the above and agree.

## 2015-06-13 ENCOUNTER — Encounter: Payer: Self-pay | Admitting: Women's Health

## 2015-06-13 ENCOUNTER — Other Ambulatory Visit: Payer: Self-pay | Admitting: *Deleted

## 2015-06-13 ENCOUNTER — Ambulatory Visit (INDEPENDENT_AMBULATORY_CARE_PROVIDER_SITE_OTHER): Payer: Medicaid Other | Admitting: Women's Health

## 2015-06-13 VITALS — BP 116/60 | HR 96 | Wt 290.0 lb

## 2015-06-13 DIAGNOSIS — Z369 Encounter for antenatal screening, unspecified: Secondary | ICD-10-CM

## 2015-06-13 DIAGNOSIS — Z1389 Encounter for screening for other disorder: Secondary | ICD-10-CM

## 2015-06-13 DIAGNOSIS — A6000 Herpesviral infection of urogenital system, unspecified: Secondary | ICD-10-CM

## 2015-06-13 DIAGNOSIS — Z331 Pregnant state, incidental: Secondary | ICD-10-CM

## 2015-06-13 DIAGNOSIS — Z3493 Encounter for supervision of normal pregnancy, unspecified, third trimester: Secondary | ICD-10-CM

## 2015-06-13 LAB — POCT URINALYSIS DIPSTICK
Blood, UA: NEGATIVE
GLUCOSE UA: NEGATIVE
KETONES UA: NEGATIVE
Leukocytes, UA: NEGATIVE
Nitrite, UA: NEGATIVE
Protein, UA: NEGATIVE

## 2015-06-13 MED ORDER — ACYCLOVIR 400 MG PO TABS: 400.0000 mg | ORAL_TABLET | Freq: Three times a day (TID) | ORAL | Status: DC

## 2015-06-13 NOTE — Patient Instructions (Signed)
You will have your sugar test next visit.  Please do not eat or drink anything after midnight the night before you come, not even water.  You will be here for at least two hours.   Open at 8:00am  Call the office [phone]) or go to [facility] if:  You begin to have strong, frequent contractions  Your water breaks.  Sometimes it is a big gush of fluid, sometimes it is just a trickle that keeps getting your panties wet or running down your legs  You have vaginal bleeding.  It is normal to have a small amount of spotting if your cervix was checked.   You don't feel your baby moving like normal.  If you don't, get you something to eat and drink and lay down and focus on feeling your baby move.  You should feel at least 10 movements in 2 hours.  If you don't, you should call the office or go to [facility].    Tdap Vaccine  It is recommended that you get the Tdap vaccine during the third trimester of EACH pregnancy to help protect your baby from getting pertussis (whooping cough)  27-36 weeks is the BEST time to do this so that you can pass the protection on to your baby. During pregnancy is better than after pregnancy, but if you are unable to get it during pregnancy it will be offered at the hospital.   You can get this vaccine at the health department or your family doctor  Everyone who will be around your baby should also be up-to-date on their vaccines. Adults (who are not pregnant) only need 1 dose of Tdap during adulthood.   Third Trimester of Pregnancy The third trimester is from week 29 through week 42, months 7 through 9. The third trimester is a time when the fetus is growing rapidly. At the end of the ninth month, the fetus is about 20 inches in length and weighs 6-10 pounds.  BODY CHANGES Your body goes through many changes during pregnancy. The changes vary from woman to woman.   Your weight will continue to increase. You can expect to gain 25-35 pounds (11-16 kg) by  the end of the pregnancy.  You may begin to get stretch marks on your hips, abdomen, and breasts.  You may urinate more often because the fetus is moving lower into your pelvis and pressing on your bladder.  You may develop or continue to have heartburn as a result of your pregnancy.  You may develop constipation because certain hormones are causing the muscles that push waste through your intestines to slow down.  You may develop hemorrhoids or swollen, bulging veins (varicose veins).  You may have pelvic pain because of the weight gain and pregnancy hormones relaxing your joints between the bones in your pelvis. Backaches may result from overexertion of the muscles supporting your posture.  You may have changes in your hair. These can include thickening of your hair, rapid growth, and changes in texture. Some women also have hair loss during or after pregnancy, or hair that feels dry or thin. Your hair will most likely return to normal after your baby is born.  Your breasts will continue to grow and be tender. A yellow discharge may leak from your breasts called colostrum.  Your belly button may stick out.  You may feel short of breath because of your expanding uterus.  You may notice the fetus "dropping," or moving lower in your abdomen.  You  may have a bloody mucus discharge. This usually occurs a few days to a week before labor begins.  Your cervix becomes thin and soft (effaced) near your due date. WHAT TO EXPECT AT YOUR PRENATAL EXAMS  You will have prenatal exams every 2 weeks until week 36. Then, you will have weekly prenatal exams. During a routine prenatal visit:  You will be weighed to make sure you and the fetus are growing normally.  Your blood pressure is taken.  Your abdomen will be measured to track your baby's growth.  The fetal heartbeat will be listened to.  Any test results from the previous visit will be discussed.  You may have a cervical check near your  due date to see if you have effaced. At around 36 weeks, your caregiver will check your cervix. At the same time, your caregiver will also perform a test on the secretions of the vaginal tissue. This test is to determine if a type of bacteria, Group B streptococcus, is present. Your caregiver will explain this further. Your caregiver may ask you:  What your birth plan is.  How you are feeling.  If you are feeling the baby move.  If you have had any abnormal symptoms, such as leaking fluid, bleeding, severe headaches, or abdominal cramping.  If you have any questions. Other tests or screenings that may be performed during your third trimester include:  Blood tests that check for low iron levels (anemia).  Fetal testing to check the health, activity level, and growth of the fetus. Testing is done if you have certain medical conditions or if there are problems during the pregnancy. FALSE LABOR You may feel small, irregular contractions that eventually go away. These are called Braxton Hicks contractions, or false labor. Contractions may last for hours, days, or even weeks before true labor sets in. If contractions come at regular intervals, intensify, or become painful, it is best to be seen by your caregiver.  SIGNS OF LABOR   Menstrual-like cramps.  Contractions that are 5 minutes apart or less.  Contractions that start on the top of the uterus and spread down to the lower abdomen and back.  A sense of increased pelvic pressure or back pain.  A watery or bloody mucus discharge that comes from the vagina. If you have any of these signs before the 37th week of pregnancy, call your caregiver right away. You need to go to the hospital to get checked immediately. HOME CARE INSTRUCTIONS   Avoid all smoking, herbs, alcohol, and unprescribed drugs. These chemicals affect the formation and growth of the baby.  Follow your caregiver's instructions regarding medicine use. There are medicines  that are either safe or unsafe to take during pregnancy.  Exercise only as directed by your caregiver. Experiencing uterine cramps is a good sign to stop exercising.  Continue to eat regular, healthy meals.  Wear a good support bra for breast tenderness.  Do not use hot tubs, steam rooms, or saunas.  Wear your seat belt at all times when driving.  Avoid raw meat, uncooked cheese, cat litter boxes, and soil used by cats. These carry germs that can cause birth defects in the baby.  Take your prenatal vitamins.  Try taking a stool softener (if your caregiver approves) if you develop constipation. Eat more high-fiber foods, such as fresh vegetables or fruit and whole grains. Drink plenty of fluids to keep your urine clear or pale yellow.  Take warm sitz baths to soothe any pain or  discomfort caused by hemorrhoids. Use hemorrhoid cream if your caregiver approves.  If you develop varicose veins, wear support hose. Elevate your feet for 15 minutes, 3-4 times a day. Limit salt in your diet.  Avoid heavy lifting, wear low heal shoes, and practice good posture.  Rest a lot with your legs elevated if you have leg cramps or low back pain.  Visit your dentist if you have not gone during your pregnancy. Use a soft toothbrush to brush your teeth and be gentle when you floss.  A sexual relationship may be continued unless your caregiver directs you otherwise.  Do not travel far distances unless it is absolutely necessary and only with the approval of your caregiver.  Take prenatal classes to understand, practice, and ask questions about the labor and delivery.  Make a trial run to the hospital.  Pack your hospital bag.  Prepare the baby's nursery.  Continue to go to all your prenatal visits as directed by your caregiver. SEEK MEDICAL CARE IF:  You are unsure if you are in labor or if your water has broken.  You have dizziness.  You have mild pelvic cramps, pelvic pressure, or nagging  pain in your abdominal area.  You have persistent nausea, vomiting, or diarrhea.  You have a bad smelling vaginal discharge.  You have pain with urination. SEEK IMMEDIATE MEDICAL CARE IF:   You have a fever.  You are leaking fluid from your vagina.  You have spotting or bleeding from your vagina.  You have severe abdominal cramping or pain.  You have rapid weight loss or gain.  You have shortness of breath with chest pain.  You notice sudden or extreme swelling of your face, hands, ankles, feet, or legs.  You have not felt your baby move in over an hour.  You have severe headaches that do not go away with medicine.  You have vision changes. Document Released: 05/21/2001 Document Revised: 06/01/2013 Document Reviewed: 07/28/2012 [facility] Patient Information 2015 [place], [place]. This information is not intended to replace advice given to you by your health care provider. Make sure you discuss any questions you have with your health care provider.

## 2015-06-13 NOTE — Progress Notes (Signed)
Low-risk OB appointment [id number] [id number] Estimated Date of Delivery: 07/28/15 BP 116/60 mmHg  Pulse 96  Wt 290 lb (131.543 kg)  LMP 10/06/2014  BP, weight, and urine reviewed.  Refer to obstetrical flow sheet for FH & FHR.  Reports good fm.  Denies regular uc's, lof, vb, or uti s/s. WEnt to [facility] the other day d/t abdominal pain, states she was having uc's but they went away- wasn't able to make it to [place] d/t transportation issues. Never got PN1 and missed PN2 appt d/t transportation issues.  Reviewed ptl s/s, fkc. Recommended Tdap at HD/PCP per CDC guidelines. Rx acyclovir 400mg  tid #90 w/ 3RF to begin at 34wks Plan:  Continue routine obstetrical care  F/U in 1d for pn2 and pn1 labs that were missed (no visit), then 2wks for OB appointment

## 2015-06-14 ENCOUNTER — Other Ambulatory Visit: Payer: Medicaid Other

## 2015-06-14 ENCOUNTER — Encounter: Payer: Self-pay | Admitting: Women's Health

## 2015-06-14 DIAGNOSIS — F1290 Cannabis use, unspecified, uncomplicated: Secondary | ICD-10-CM | POA: Insufficient documentation

## 2015-06-14 LAB — PMP SCREEN PROFILE (10S), URINE
AMPHETAMINE SCRN UR: NEGATIVE ng/mL
BENZODIAZEPINE SCREEN, URINE: NEGATIVE ng/mL
Barbiturate Screen, Ur: NEGATIVE ng/mL
CANNABINOIDS UR QL SCN: POSITIVE ng/mL
COCAINE(METAB.) SCREEN, URINE: NEGATIVE ng/mL
Creatinine(Crt), U: 109 mg/dL (ref 20.0–300.0)
Methadone Scn, Ur: NEGATIVE ng/mL
OPIATE SCRN UR: NEGATIVE ng/mL
OXYCODONE+OXYMORPHONE UR QL SCN: NEGATIVE ng/mL
PCP SCRN UR: NEGATIVE ng/mL
PH UR, DRUG SCRN: 7 (ref 4.5–8.9)
Propoxyphene, Screen: NEGATIVE ng/mL

## 2015-06-15 LAB — GLUCOSE TOLERANCE, 2 HOURS W/ 1HR
GLUCOSE, 2 HOUR: 113 mg/dL (ref 65–152)
Glucose, 1 hour: 175 mg/dL (ref 65–179)
Glucose, Fasting: 88 mg/dL (ref 65–91)

## 2015-06-15 LAB — CBC
HEMATOCRIT: 35 % (ref 34.0–46.6)
HEMOGLOBIN: 11.5 g/dL (ref 11.1–15.9)
MCH: 25.6 pg — AB (ref 26.6–33.0)
MCHC: 32.9 g/dL (ref 31.5–35.7)
MCV: 78 fL — ABNORMAL LOW (ref 79–97)
Platelets: 157 10*3/uL (ref 150–379)
RBC: 4.49 x10E6/uL (ref 3.77–5.28)
RDW: 13.5 % (ref 12.3–15.4)
WBC: 8.9 10*3/uL (ref 3.4–10.8)

## 2015-06-15 LAB — HIV ANTIBODY (ROUTINE TESTING W REFLEX): HIV SCREEN 4TH GENERATION: NONREACTIVE

## 2015-06-15 LAB — HEPATITIS B SURFACE ANTIGEN: Hepatitis B Surface Ag: NEGATIVE

## 2015-06-15 LAB — RUBELLA SCREEN: RUBELLA: 1.04 {index} (ref >0.99)

## 2015-06-15 LAB — ANTIBODY SCREEN: ANTIBODY SCREEN: NEGATIVE

## 2015-06-15 LAB — RPR: RPR Ser Ql: NONREACTIVE

## 2015-06-15 LAB — VARICELLA ZOSTER ANTIBODY, IGG: Varicella zoster IgG: 357 index (ref >165)

## 2015-06-18 ENCOUNTER — Other Ambulatory Visit: Payer: Self-pay | Admitting: Obstetrics and Gynecology

## 2015-06-18 DIAGNOSIS — O99013 Anemia complicating pregnancy, third trimester: Secondary | ICD-10-CM

## 2015-06-18 MED ORDER — DOCUSATE SODIUM 100 MG PO CAPS: 100.0000 mg | ORAL_CAPSULE | Freq: Two times a day (BID) | ORAL | Status: DC | PRN

## 2015-06-18 MED ORDER — FERROUS SULFATE 325 (65 FE) MG PO TBEC: 325.0000 mg | DELAYED_RELEASE_TABLET | Freq: Three times a day (TID) | ORAL | Status: DC

## 2015-06-28 ENCOUNTER — Ambulatory Visit (INDEPENDENT_AMBULATORY_CARE_PROVIDER_SITE_OTHER): Payer: Medicaid Other | Admitting: Women's Health

## 2015-06-28 ENCOUNTER — Encounter: Payer: Self-pay | Admitting: Women's Health

## 2015-06-28 VITALS — BP 120/60 | HR 104 | Wt 291.0 lb

## 2015-06-28 DIAGNOSIS — Z331 Pregnant state, incidental: Secondary | ICD-10-CM

## 2015-06-28 DIAGNOSIS — F1210 Cannabis abuse, uncomplicated: Secondary | ICD-10-CM

## 2015-06-28 DIAGNOSIS — F1290 Cannabis use, unspecified, uncomplicated: Secondary | ICD-10-CM

## 2015-06-28 DIAGNOSIS — Z3493 Encounter for supervision of normal pregnancy, unspecified, third trimester: Secondary | ICD-10-CM

## 2015-06-28 DIAGNOSIS — Z1389 Encounter for screening for other disorder: Secondary | ICD-10-CM

## 2015-06-28 DIAGNOSIS — O368130 Decreased fetal movements, third trimester, not applicable or unspecified: Secondary | ICD-10-CM | POA: Diagnosis not present

## 2015-06-28 LAB — POCT URINALYSIS DIPSTICK
Blood, UA: NEGATIVE
GLUCOSE UA: NEGATIVE
KETONES UA: NEGATIVE
Nitrite, UA: NEGATIVE
PROTEIN UA: NEGATIVE

## 2015-06-28 NOTE — Progress Notes (Signed)
Low-risk OB appointment G2P1001 [redacted]w[redacted]d Estimated Date of Delivery: 07/28/15 BP 120/60 mmHg  Pulse 104  Wt 291 lb (131.997 kg)  LMP 10/06/2014  BP, weight, and urine reviewed.  Refer to obstetrical flow sheet for FH & FHR.  Reports decreased fm x 3d.  Denies regular uc's, lof, vb, or uti s/s. Intermittent pain RUQ- not near liver. Likely baby pressing on something.  NST reactive, pt felt much fm during nst Reviewed ptl s/s, fkc. Do not wait 3d if feeling decreased fm Plan:  Continue routine obstetrical care  F/U in 1wk for OB appointment and gbs

## 2015-06-28 NOTE — Patient Instructions (Addendum)
Call the office [phone]) or go to [facility] if:  You begin to have strong, frequent contractions  Your water breaks.  Sometimes it is a big gush of fluid, sometimes it is just a trickle that keeps getting your panties wet or running down your legs  You have vaginal bleeding.  It is normal to have a small amount of spotting if your cervix was checked.   You don't feel your baby moving like normal.  If you don't, get you something to eat and drink and lay down and focus on feeling your baby move.  You should feel at least 10 movements in 2 hours.  If you don't, you should call the office or go to [facility].    Fetal Movement Counts Patient Name: __________________________________________________ Patient Due Date: ____________________ Performing a fetal movement count is highly recommended in high-risk pregnancies, but it is good for every pregnant woman to do. Your health care provider may ask you to start counting fetal movements at 28 weeks of the pregnancy. Fetal movements often increase:  After eating a full meal.  After physical activity.  After eating or drinking something sweet or cold.  At rest. Pay attention to when you feel the baby is most active. This will help you notice a pattern of your baby's sleep and wake cycles and what factors contribute to an increase in fetal movement. It is important to perform a fetal movement count at the same time each day when your baby is normally most active.  HOW TO COUNT FETAL MOVEMENTS  Find a quiet and comfortable area to sit or lie down on your left side. Lying on your left side provides the best blood and oxygen circulation to your baby.  Write down the day and time on a sheet of paper or in a journal.  Start counting kicks, flutters, swishes, rolls, or jabs in a 2-hour period. You should feel at least 10 movements within 2 hours.  If you do not feel 10 movements in 2 hours, wait 2-3 hours and count again. Look for a  change in the pattern or not enough counts in 2 hours. SEEK MEDICAL CARE IF:  You feel less than 10 counts in 2 hours, tried twice.  There is no movement in over an hour.  The pattern is changing or taking longer each day to reach 10 counts in 2 hours.  You feel the baby is not moving as he or she usually does. Date: ____________ Movements: ____________ Start time: ____________ Brittney Nicholson time: ____________  Date: ____________ Movements: ____________ Start time: ____________ Brittney Nicholson time: ____________ Date: ____________ Movements: ____________ Start time: ____________ Brittney Nicholson time: ____________ Date: ____________ Movements: ____________ Start time: ____________ Brittney Nicholson time: ____________ Date: ____________ Movements: ____________ Start time: ____________ Brittney Nicholson time: ____________ Date: ____________ Movements: ____________ Start time: ____________ Brittney Nicholson time: ____________ Date: ____________ Movements: ____________ Start time: ____________ Brittney Nicholson time: ____________ Date: ____________ Movements: ____________ Start time: ____________ Brittney Nicholson time: ____________  Date: ____________ Movements: ____________ Start time: ____________ Brittney Nicholson time: ____________ Date: ____________ Movements: ____________ Start time: ____________ Brittney Nicholson time: ____________ Date: ____________ Movements: ____________ Start time: ____________ Brittney Nicholson time: ____________ Date: ____________ Movements: ____________ Start time: ____________ Brittney Nicholson time: ____________ Date: ____________ Movements: ____________ Start time: ____________ Brittney Nicholson time: ____________ Date: ____________ Movements: ____________ Start time: ____________ Brittney Nicholson time: ____________ Date: ____________ Movements: ____________ Start time: ____________ Brittney Nicholson time: ____________  Date: ____________ Movements: ____________ Start time: ____________ Brittney Nicholson time: ____________ Date: ____________ Movements: ____________ Start time: ____________ Brittney Nicholson time: ____________ Date:  ____________  Movements: ____________ Start time: ____________ Brittney Nicholson time: ____________ Date: ____________ Movements: ____________ Start time: ____________ Brittney Nicholson time: ____________ Date: ____________ Movements: ____________ Start time: ____________ Brittney Nicholson time: ____________ Date: ____________ Movements: ____________ Start time: ____________ Brittney Nicholson time: ____________ Date: ____________ Movements: ____________ Start time: ____________ Brittney Nicholson time: ____________  Date: ____________ Movements: ____________ Start time: ____________ Brittney Nicholson time: ____________ Date: ____________ Movements: ____________ Start time: ____________ Brittney Nicholson time: ____________ Date: ____________ Movements: ____________ Start time: ____________ Brittney Nicholson time: ____________ Date: ____________ Movements: ____________ Start time: ____________ Brittney Nicholson time: ____________ Date: ____________ Movements: ____________ Start time: ____________ Brittney Nicholson time: ____________ Date: ____________ Movements: ____________ Start time: ____________ Brittney Nicholson time: ____________ Date: ____________ Movements: ____________ Start time: ____________ Brittney Nicholson time: ____________  Date: ____________ Movements: ____________ Start time: ____________ Brittney Nicholson time: ____________ Date: ____________ Movements: ____________ Start time: ____________ Brittney Nicholson time: ____________ Date: ____________ Movements: ____________ Start time: ____________ Brittney Nicholson time: ____________ Date: ____________ Movements: ____________ Start time: ____________ Brittney Nicholson time: ____________ Date: ____________ Movements: ____________ Start time: ____________ Brittney Nicholson time: ____________ Date: ____________ Movements: ____________ Start time: ____________ Brittney Nicholson time: ____________ Date: ____________ Movements: ____________ Start time: ____________ Brittney Nicholson time: ____________  Date: ____________ Movements: ____________ Start time: ____________ Brittney Nicholson time: ____________ Date: ____________ Movements: ____________ Start  time: ____________ Brittney Nicholson time: ____________ Date: ____________ Movements: ____________ Start time: ____________ Brittney Nicholson time: ____________ Date: ____________ Movements: ____________ Start time: ____________ Brittney Nicholson time: ____________ Date: ____________ Movements: ____________ Start time: ____________ Brittney Nicholson time: ____________ Date: ____________ Movements: ____________ Start time: ____________ Brittney Nicholson time: ____________ Date: ____________ Movements: ____________ Start time: ____________ Brittney Nicholson time: ____________  Date: ____________ Movements: ____________ Start time: ____________ Brittney Nicholson time: ____________ Date: ____________ Movements: ____________ Start time: ____________ Brittney Nicholson time: ____________ Date: ____________ Movements: ____________ Start time: ____________ Brittney Nicholson time: ____________ Date: ____________ Movements: ____________ Start time: ____________ Brittney Nicholson time: ____________ Date: ____________ Movements: ____________ Start time: ____________ Brittney Nicholson time: ____________ Date: ____________ Movements: ____________ Start time: ____________ Brittney Nicholson time: ____________ Date: ____________ Movements: ____________ Start time: ____________ Brittney Nicholson time: ____________  Date: ____________ Movements: ____________ Start time: ____________ Brittney Nicholson time: ____________ Date: ____________ Movements: ____________ Start time: ____________ Brittney Nicholson time: ____________ Date: ____________ Movements: ____________ Start time: ____________ Brittney Nicholson time: ____________ Date: ____________ Movements: ____________ Start time: ____________ Brittney Nicholson time: ____________ Date: ____________ Movements: ____________ Start time: ____________ Brittney Nicholson time: ____________ Date: ____________ Movements: ____________ Start time: ____________ Brittney Nicholson time: ____________   This information is not intended to replace advice given to you by your health care provider. Make sure you discuss any questions you have with your health care provider.   Document  Released: 06/26/2006 Document Revised: 06/17/2014 Document Reviewed: 03/23/2012 Elsevier Interactive Patient Education 2016 [employer].  Preterm Labor Information Preterm labor is when labor starts at less than 37 weeks of pregnancy. The normal length of a pregnancy is 39 to 41 weeks. CAUSES Often, there is no identifiable underlying cause as to why a woman goes into preterm labor. One of the most common known causes of preterm labor is infection. Infections of the uterus, cervix, vagina, amniotic sac, bladder, kidney, or even the lungs (pneumonia) can cause labor to start. Other suspected causes of preterm labor include:   Urogenital infections, such as yeast infections and bacterial vaginosis.   Uterine abnormalities (uterine shape, uterine septum, fibroids, or bleeding from the placenta).   A cervix that has been operated on (it may fail to stay closed).   Malformations in the fetus.   Multiple gestations (twins, triplets, and so on).   Breakage of the amniotic sac.  RISK FACTORS  Having a previous history of preterm  labor.   Having premature rupture of membranes (PROM).   Having a placenta that covers the opening of the cervix (placenta previa).   Having a placenta that separates from the uterus (placental abruption).   Having a cervix that is too weak to hold the fetus in the uterus (incompetent cervix).   Having too much fluid in the amniotic sac (polyhydramnios).   Taking illegal drugs or smoking while pregnant.   Not gaining enough weight while pregnant.   Being younger than 38 and older than 23 years old.   Having a low socioeconomic status.   Being African American. SYMPTOMS Signs and symptoms of preterm labor include:   Menstrual-like cramps, abdominal pain, or back pain.  Uterine contractions that are regular, as frequent as six in an hour, regardless of their intensity (may be mild or painful).  Contractions that start on the top of the  uterus and spread down to the lower abdomen and back.   A sense of increased pelvic pressure.   A watery or bloody mucus discharge that comes from the vagina.  TREATMENT Depending on the length of the pregnancy and other circumstances, your health care provider may suggest bed rest. If necessary, there are medicines that can be given to stop contractions and to mature the fetal lungs. If labor happens before 34 weeks of pregnancy, a prolonged hospital stay may be recommended. Treatment depends on the condition of both you and the fetus.  WHAT SHOULD YOU DO IF YOU THINK YOU ARE IN PRETERM LABOR? Call your health care provider right away. You will need to go to the hospital to get checked immediately. HOW CAN YOU PREVENT PRETERM LABOR IN FUTURE PREGNANCIES? You should:   Stop smoking if you smoke.  Maintain healthy weight gain and avoid chemicals and drugs that are not necessary.  Be watchful for any type of infection.  Inform your health care provider if you have a known history of preterm labor.   This information is not intended to replace advice given to you by your health care provider. Make sure you discuss any questions you have with your health care provider.   Document Released: 08/17/2003 Document Revised: 01/27/2013 Document Reviewed: 06/29/2012 Elsevier Interactive Patient Education [employer]

## 2015-07-06 ENCOUNTER — Ambulatory Visit (INDEPENDENT_AMBULATORY_CARE_PROVIDER_SITE_OTHER): Payer: Medicaid Other | Admitting: Advanced Practice Midwife

## 2015-07-06 ENCOUNTER — Encounter: Payer: Self-pay | Admitting: Advanced Practice Midwife

## 2015-07-06 VITALS — BP 98/60 | HR 74 | Wt 290.0 lb

## 2015-07-06 DIAGNOSIS — Z1389 Encounter for screening for other disorder: Secondary | ICD-10-CM

## 2015-07-06 DIAGNOSIS — Z3493 Encounter for supervision of normal pregnancy, unspecified, third trimester: Secondary | ICD-10-CM

## 2015-07-06 DIAGNOSIS — Z331 Pregnant state, incidental: Secondary | ICD-10-CM

## 2015-07-06 DIAGNOSIS — Z1159 Encounter for screening for other viral diseases: Secondary | ICD-10-CM

## 2015-07-06 DIAGNOSIS — Z118 Encounter for screening for other infectious and parasitic diseases: Secondary | ICD-10-CM

## 2015-07-06 DIAGNOSIS — Z3685 Encounter for antenatal screening for Streptococcus B: Secondary | ICD-10-CM

## 2015-07-06 LAB — POCT URINALYSIS DIPSTICK
Glucose, UA: NEGATIVE
KETONES UA: NEGATIVE
Leukocytes, UA: NEGATIVE
Nitrite, UA: NEGATIVE
RBC UA: NEGATIVE

## 2015-07-06 NOTE — Patient Instructions (Signed)

## 2015-07-06 NOTE — Progress Notes (Signed)
G2P1001 [redacted]w[redacted]d Estimated Date of Delivery: 07/28/15  Blood pressure 98/60, pulse 74, weight 290 lb (131.543 kg), last menstrual period 10/06/2014.   BP weight and urine results all reviewed and noted.  Please refer to the obstetrical flow sheet for the fundal height and fetal heart rate documentation:  Patient reports good fetal movement, denies any bleeding and no rupture of membranes symptoms or regular contractions. Patient is without complaints other than normal pregnancy complaints All questions were answered.  Orders Placed This Encounter  Procedures  . GC/Chlamydia Probe Amp  . Strep Gp B NAA  . POCT urinalysis dipstick    Plan:  Continued routine obstetrical care, GBS, GC/CHL today  Return in about 1 week (around 07/13/2015) for LROB.

## 2015-07-08 LAB — STREP GP B NAA: STREP GROUP B AG: NEGATIVE

## 2015-07-09 LAB — GC/CHLAMYDIA PROBE AMP
CHLAMYDIA, DNA PROBE: NEGATIVE
NEISSERIA GONORRHOEAE BY PCR: NEGATIVE

## 2015-07-13 ENCOUNTER — Encounter: Payer: Medicaid Other | Admitting: Obstetrics & Gynecology

## 2015-07-14 ENCOUNTER — Ambulatory Visit (INDEPENDENT_AMBULATORY_CARE_PROVIDER_SITE_OTHER): Payer: Medicaid Other | Admitting: Obstetrics and Gynecology

## 2015-07-14 ENCOUNTER — Encounter: Payer: Self-pay | Admitting: Obstetrics and Gynecology

## 2015-07-14 VITALS — BP 100/62 | HR 104

## 2015-07-14 DIAGNOSIS — Z3493 Encounter for supervision of normal pregnancy, unspecified, third trimester: Secondary | ICD-10-CM

## 2015-07-14 DIAGNOSIS — Z331 Pregnant state, incidental: Secondary | ICD-10-CM

## 2015-07-14 DIAGNOSIS — A6000 Herpesviral infection of urogenital system, unspecified: Secondary | ICD-10-CM

## 2015-07-14 DIAGNOSIS — Z1389 Encounter for screening for other disorder: Secondary | ICD-10-CM

## 2015-07-14 LAB — POCT URINALYSIS DIPSTICK
Glucose, UA: NEGATIVE
KETONES UA: NEGATIVE
LEUKOCYTES UA: NEGATIVE
Nitrite, UA: NEGATIVE
PROTEIN UA: NEGATIVE
RBC UA: NEGATIVE

## 2015-07-14 NOTE — Progress Notes (Signed)
Patient ID: Brittney Nicholson, female   DOB: Jun 05, 1991, 25 y.o.   MRN: [id number]  G2P1001 [redacted]w[redacted]d Estimated Date of Delivery: 07/28/15  Last menstrual period 10/06/2014.   refer to the ob flow sheet for FH and FHR, also BP, Wt, Urine results:notable for negative  Patient reports  + good fetal movement, denies any bleeding and no rupture of membranes symptoms or regular contractions. Patient complaints:none. She does note occasional epigastric abdominal cramping at night. Pt states she plans on getting an IUD after birth.   FHR- 156 bpm  FH- 38 cm  Cervix- posterior, soft, 1 cm  Questions were answered. Assessment: LROB G2P1001 @ [redacted]w[redacted]d  Cervix 1 cm, soft, posterior    Plan:  Continued routine obstetrical care,   F/u in 1 weeks for routine prenatal care     By signing my name below, I, Doreatha Martin, attest that this documentation has been prepared under the direction and in the presence of Tilda Burrow, MD. Electronically Signed: Doreatha Martin, ED Scribe. 07/14/2015. 12:43 PM.  I personally performed the services described in this documentation, which was SCRIBED in my presence. The recorded information has been reviewed and considered accurate. It has been edited as necessary during review. Tilda Burrow, MD

## 2015-07-14 NOTE — Progress Notes (Signed)
Pt denies any problems or concerns at this time.  

## 2015-07-19 ENCOUNTER — Encounter: Payer: Self-pay | Admitting: Women's Health

## 2015-07-19 ENCOUNTER — Ambulatory Visit (INDEPENDENT_AMBULATORY_CARE_PROVIDER_SITE_OTHER): Payer: Medicaid Other | Admitting: Women's Health

## 2015-07-19 VITALS — BP 122/72 | HR 104 | Wt 297.0 lb

## 2015-07-19 DIAGNOSIS — Z23 Encounter for immunization: Secondary | ICD-10-CM

## 2015-07-19 DIAGNOSIS — Z369 Encounter for antenatal screening, unspecified: Secondary | ICD-10-CM

## 2015-07-19 DIAGNOSIS — N390 Urinary tract infection, site not specified: Secondary | ICD-10-CM

## 2015-07-19 DIAGNOSIS — Z1389 Encounter for screening for other disorder: Secondary | ICD-10-CM

## 2015-07-19 DIAGNOSIS — R82998 Other abnormal findings in urine: Secondary | ICD-10-CM

## 2015-07-19 DIAGNOSIS — Z3493 Encounter for supervision of normal pregnancy, unspecified, third trimester: Secondary | ICD-10-CM

## 2015-07-19 DIAGNOSIS — F1290 Cannabis use, unspecified, uncomplicated: Secondary | ICD-10-CM

## 2015-07-19 DIAGNOSIS — F1210 Cannabis abuse, uncomplicated: Secondary | ICD-10-CM

## 2015-07-19 DIAGNOSIS — Z331 Pregnant state, incidental: Secondary | ICD-10-CM

## 2015-07-19 LAB — POCT URINALYSIS DIPSTICK
Glucose, UA: NEGATIVE
Ketones, UA: NEGATIVE
Nitrite, UA: NEGATIVE

## 2015-07-19 NOTE — Progress Notes (Signed)
Low-risk OB appointment G2P1001 [redacted]w[redacted]d Estimated Date of Delivery: 07/28/15 BP 122/72 mmHg  Pulse 104  Wt 297 lb (134.718 kg)  LMP 10/06/2014  BP, weight, and urine reviewed.  Refer to obstetrical flow sheet for FH & FHR.  Reports good fm.  Denies regular uc's, lof, vb, or uti s/s. Lots of pelvic/SP pain/pressure, making it hard to walk, etc- sounds like SP dysfunction- discussed relief measures. Doesn't have money to be able to go to chiropractor.  SVE per request: 1.5/50/-2, vtx, posterior. Interested in membrane sweeping next week if possible Reviewed labor s/s, fkc. Plan:  Continue routine obstetrical care  F/U in 1wk for OB appointment  Flu shot today

## 2015-07-19 NOTE — Patient Instructions (Addendum)
Symphysis Pubis dysfunction Pregnancy belt Warm bath  Heating pad to lower back Tylenol  Call the office [phone]) or go to [facility] if:  You begin to have strong, frequent contractions  Your water breaks.  Sometimes it is a big gush of fluid, sometimes it is just a trickle that keeps getting your panties wet or running down your legs  You have vaginal bleeding.  It is normal to have a small amount of spotting if your cervix was checked.   You don't feel your baby moving like normal.  If you don't, get you something to eat and drink and lay down and focus on feeling your baby move.  You should feel at least 10 movements in 2 hours.  If you don't, you should call the office or go to [facility].    [facility] Contractions Contractions of the uterus can occur throughout pregnancy. Contractions are not always a sign that you are in labor.  WHAT ARE BRAXTON HICKS CONTRACTIONS?  Contractions that occur before labor are called Braxton Hicks contractions, or false labor. Toward the end of pregnancy (32-34 weeks), these contractions can develop more often and may become more forceful. This is not true labor because these contractions do not result in opening (dilatation) and thinning of the cervix. They are sometimes difficult to tell apart from true labor because these contractions can be forceful and people have different pain tolerances. You should not feel embarrassed if you go to the hospital with false labor. Sometimes, the only way to tell if you are in true labor is for your health care provider to look for changes in the cervix. If there are no prenatal problems or other health problems associated with the pregnancy, it is completely safe to be sent home with false labor and await the onset of true labor. HOW CAN YOU TELL THE DIFFERENCE BETWEEN TRUE AND FALSE LABOR? False Labor  The contractions of false labor are usually shorter and not as hard as those of true labor.    The contractions are usually irregular.   The contractions are often felt in the front of the lower abdomen and in the groin.   The contractions may go away when you walk around or change positions while lying down.   The contractions get weaker and are shorter lasting as time goes on.   The contractions do not usually become progressively stronger, regular, and closer together as with true labor.  True Labor  Contractions in true labor last 30-70 seconds, become very regular, usually become more intense, and increase in frequency.   The contractions do not go away with walking.   The discomfort is usually felt in the top of the uterus and spreads to the lower abdomen and low back.   True labor can be determined by your health care provider with an exam. This will show that the cervix is dilating and getting thinner.  WHAT TO REMEMBER  Keep up with your usual exercises and follow other instructions given by your health care provider.   Take medicines as directed by your health care provider.   Keep your regular prenatal appointments.   Eat and drink lightly if you think you are going into labor.   If Braxton Hicks contractions are making you uncomfortable:   Change your position from lying down or resting to walking, or from walking to resting.   Sit and rest in a tub of warm water.   Drink 2-3 glasses of water. Dehydration  may cause these contractions.   Do slow and deep breathing several times an hour.  WHEN SHOULD I SEEK IMMEDIATE MEDICAL CARE? Seek immediate medical care if:  Your contractions become stronger, more regular, and closer together.   You have fluid leaking or gushing from your vagina.   You have a fever.   You pass blood-tinged mucus.   You have vaginal bleeding.   You have continuous abdominal pain.   You have low back pain that you never had before.   You feel your baby's head pushing down and causing pelvic  pressure.   Your baby is not moving as much as it used to.    This information is not intended to replace advice given to you by your health care provider. Make sure you discuss any questions you have with your health care provider.   Document Released: 05/27/2005 Document Revised: 06/01/2013 Document Reviewed: 03/08/2013 Elsevier Interactive Patient Education [employer]

## 2015-07-20 LAB — PMP SCREEN PROFILE (10S), URINE
AMPHETAMINE SCRN UR: NEGATIVE ng/mL
BARBITURATE SCRN UR: NEGATIVE ng/mL
Benzodiazepine Screen, Urine: NEGATIVE ng/mL
Cannabinoids Ur Ql Scn: NEGATIVE ng/mL
Cocaine(Metab.)Screen, Urine: NEGATIVE ng/mL
Creatinine(Crt), U: 141.4 mg/dL (ref 20.0–300.0)
Methadone Scn, Ur: NEGATIVE ng/mL
OPIATE SCRN UR: NEGATIVE ng/mL
OXYCODONE+OXYMORPHONE UR QL SCN: POSITIVE ng/mL
PCP SCRN UR: NEGATIVE ng/mL
PROPOXYPHENE SCREEN: NEGATIVE ng/mL
Ph of Urine: 6.2 (ref 4.5–8.9)

## 2015-07-20 LAB — URINE CULTURE

## 2015-07-26 ENCOUNTER — Encounter: Payer: Self-pay | Admitting: Advanced Practice Midwife

## 2015-07-26 ENCOUNTER — Ambulatory Visit (INDEPENDENT_AMBULATORY_CARE_PROVIDER_SITE_OTHER): Payer: Medicaid Other | Admitting: Advanced Practice Midwife

## 2015-07-26 VITALS — BP 104/70 | HR 92 | Wt 296.0 lb

## 2015-07-26 DIAGNOSIS — K810 Acute cholecystitis: Secondary | ICD-10-CM

## 2015-07-26 DIAGNOSIS — Z3493 Encounter for supervision of normal pregnancy, unspecified, third trimester: Secondary | ICD-10-CM

## 2015-07-26 DIAGNOSIS — Z3A40 40 weeks gestation of pregnancy: Secondary | ICD-10-CM | POA: Diagnosis not present

## 2015-07-26 DIAGNOSIS — Z3483 Encounter for supervision of other normal pregnancy, third trimester: Secondary | ICD-10-CM | POA: Diagnosis not present

## 2015-07-26 DIAGNOSIS — Z1389 Encounter for screening for other disorder: Secondary | ICD-10-CM

## 2015-07-26 DIAGNOSIS — Z331 Pregnant state, incidental: Secondary | ICD-10-CM

## 2015-07-26 LAB — POCT URINALYSIS DIPSTICK
Blood, UA: NEGATIVE
GLUCOSE UA: NEGATIVE
Ketones, UA: NEGATIVE
LEUKOCYTES UA: NEGATIVE
NITRITE UA: NEGATIVE
Protein, UA: NEGATIVE

## 2015-07-26 NOTE — Patient Instructions (Signed)

## 2015-07-26 NOTE — Progress Notes (Signed)
[id number] [redacted]w[redacted]d Estimated Date of Delivery: 07/28/15  Last menstrual period 10/06/2014.   BP weight and urine results all reviewed and noted.  Please refer to the obstetrical flow sheet for the fundal height and fetal heart rate documentation:  Patient reports good fetal movement, denies any bleeding and no rupture of membranes symptoms or regular contractions. Patient is without complaints. All questions were answered.  No orders of the defined types were placed in this encounter.    Plan:  Continued routine obstetrical care, membranes swept per .  Postdate IOL scheduled for 2/24 1145pm if undelivered.  Orders entered  Return in about 1 week (around 08/02/2015) for LROB.

## 2015-07-27 ENCOUNTER — Inpatient Hospital Stay (HOSPITAL_COMMUNITY)
Admission: AD | Admit: 2015-07-27 | Discharge: 2015-07-29 | DRG: 774 | Disposition: A | Discharge status: Home / Self Care | Payer: Medicaid Other | Source: Ambulatory Visit | Attending: Family Medicine | Admitting: Family Medicine

## 2015-07-27 ENCOUNTER — Inpatient Hospital Stay (HOSPITAL_COMMUNITY): Payer: Medicaid Other | Admitting: Anesthesiology

## 2015-07-27 ENCOUNTER — Telehealth: Payer: Self-pay | Admitting: Obstetrics & Gynecology

## 2015-07-27 ENCOUNTER — Encounter (HOSPITAL_COMMUNITY): Payer: Self-pay | Admitting: *Deleted

## 2015-07-27 DIAGNOSIS — Z3A39 39 weeks gestation of pregnancy: Secondary | ICD-10-CM | POA: Diagnosis not present

## 2015-07-27 DIAGNOSIS — Z833 Family history of diabetes mellitus: Secondary | ICD-10-CM | POA: Diagnosis not present

## 2015-07-27 DIAGNOSIS — O480 Post-term pregnancy: Secondary | ICD-10-CM | POA: Diagnosis present

## 2015-07-27 DIAGNOSIS — A6000 Herpesviral infection of urogenital system, unspecified: Secondary | ICD-10-CM | POA: Diagnosis present

## 2015-07-27 DIAGNOSIS — O9832 Other infections with a predominantly sexual mode of transmission complicating childbirth: Secondary | ICD-10-CM | POA: Diagnosis present

## 2015-07-27 DIAGNOSIS — Z6841 Body Mass Index (BMI) 40.0 and over, adult: Secondary | ICD-10-CM

## 2015-07-27 DIAGNOSIS — O99214 Obesity complicating childbirth: Secondary | ICD-10-CM | POA: Diagnosis present

## 2015-07-27 DIAGNOSIS — Z3493 Encounter for supervision of normal pregnancy, unspecified, third trimester: Secondary | ICD-10-CM

## 2015-07-27 LAB — CBC
HCT: 36.2 % (ref 36.0–46.0)
HEMOGLOBIN: 11.8 g/dL — AB (ref 12.0–15.0)
MCH: 25.1 pg — ABNORMAL LOW (ref 26.0–34.0)
MCHC: 32.6 g/dL (ref 30.0–36.0)
MCV: 76.9 fL — ABNORMAL LOW (ref 78.0–100.0)
Platelets: 169 10*3/uL (ref 150–400)
RBC: 4.71 MIL/uL (ref 3.87–5.11)
RDW: 15.2 % (ref 11.5–15.5)
WBC: 14.2 10*3/uL — ABNORMAL HIGH (ref 4.0–10.5)

## 2015-07-27 LAB — TYPE AND SCREEN
ABO/RH(D): O POS
ANTIBODY SCREEN: NEGATIVE

## 2015-07-27 MED ORDER — PHENYLEPHRINE 40 MCG/ML (10ML) SYRINGE FOR IV PUSH (FOR BLOOD PRESSURE SUPPORT)
80.0000 ug | PREFILLED_SYRINGE | INTRAVENOUS | Status: DC | PRN
  Filled 2015-07-27: qty 20
  Filled 2015-07-27: qty 2

## 2015-07-27 MED ORDER — FLEET ENEMA 7-19 GM/118ML RE ENEM: 1.0000 | ENEMA | RECTAL | Status: DC | PRN

## 2015-07-27 MED ORDER — OXYCODONE-ACETAMINOPHEN 5-325 MG PO TABS: 2.0000 | ORAL_TABLET | ORAL | Status: DC | PRN

## 2015-07-27 MED ORDER — ACETAMINOPHEN 325 MG PO TABS: 650.0000 mg | ORAL_TABLET | ORAL | Status: DC | PRN

## 2015-07-27 MED ORDER — EPHEDRINE 5 MG/ML INJ
10.0000 mg | INTRAVENOUS | Status: DC | PRN
  Filled 2015-07-27: qty 2

## 2015-07-27 MED ORDER — CITRIC ACID-SODIUM CITRATE 334-500 MG/5ML PO SOLN: 30.0000 mL | ORAL | Status: DC | PRN

## 2015-07-27 MED ORDER — FENTANYL 2.5 MCG/ML BUPIVACAINE 1/10 % EPIDURAL INFUSION (WH - ANES)
14.0000 mL/h | INTRAMUSCULAR | Status: DC | PRN
  Administered 2015-07-27 – 2015-07-28 (×3): 14 mL/h via EPIDURAL
  Filled 2015-07-27 (×2): qty 125

## 2015-07-27 MED ORDER — LIDOCAINE HCL (PF) 1 % IJ SOLN
INTRAMUSCULAR | Status: DC | PRN
  Administered 2015-07-27 (×2): 8 mL via EPIDURAL

## 2015-07-27 MED ORDER — MISOPROSTOL 25 MCG QUARTER TABLET: 25.0000 ug | ORAL_TABLET | ORAL | Status: DC | PRN

## 2015-07-27 MED ORDER — PHENYLEPHRINE 40 MCG/ML (10ML) SYRINGE FOR IV PUSH (FOR BLOOD PRESSURE SUPPORT)
80.0000 ug | PREFILLED_SYRINGE | INTRAVENOUS | Status: DC | PRN
  Filled 2015-07-27: qty 2

## 2015-07-27 MED ORDER — SODIUM BICARBONATE 8.4 % IV SOLN
INTRAVENOUS | Status: DC | PRN
  Administered 2015-07-27 (×2): 4 mL via EPIDURAL
  Administered 2015-07-28: 5 mL via EPIDURAL

## 2015-07-27 MED ORDER — LACTATED RINGERS IV SOLN
500.0000 mL | Freq: Once | INTRAVENOUS | Status: AC
  Administered 2015-07-27: 500 mL via INTRAVENOUS

## 2015-07-27 MED ORDER — TERBUTALINE SULFATE 1 MG/ML IJ SOLN
0.2500 mg | Freq: Once | INTRAMUSCULAR | Status: DC | PRN
  Filled 2015-07-27: qty 1

## 2015-07-27 MED ORDER — LIDOCAINE HCL (PF) 1 % IJ SOLN
30.0000 mL | INTRAMUSCULAR | Status: DC | PRN
  Filled 2015-07-27: qty 30

## 2015-07-27 MED ORDER — ONDANSETRON HCL 4 MG/2ML IJ SOLN: 4.0000 mg | Freq: Four times a day (QID) | INTRAMUSCULAR | Status: DC | PRN

## 2015-07-27 MED ORDER — FENTANYL CITRATE (PF) 100 MCG/2ML IJ SOLN
50.0000 ug | INTRAMUSCULAR | Status: DC | PRN
  Administered 2015-07-27: 50 ug via INTRAVENOUS
  Filled 2015-07-27 (×2): qty 2

## 2015-07-27 MED ORDER — LACTATED RINGERS IV SOLN
500.0000 mL | INTRAVENOUS | Status: DC | PRN
  Administered 2015-07-27: 500 mL via INTRAVENOUS

## 2015-07-27 MED ORDER — ZOLPIDEM TARTRATE 5 MG PO TABS: 5.0000 mg | ORAL_TABLET | Freq: Every evening | ORAL | Status: DC | PRN

## 2015-07-27 MED ORDER — DIPHENHYDRAMINE HCL 50 MG/ML IJ SOLN: 12.5000 mg | INTRAMUSCULAR | Status: DC | PRN

## 2015-07-27 MED ORDER — OXYTOCIN 10 UNIT/ML IJ SOLN: 2.5000 [IU]/h | INTRAVENOUS | Status: DC

## 2015-07-27 MED ORDER — LACTATED RINGERS IV SOLN
INTRAVENOUS | Status: DC
  Administered 2015-07-27: 125 mL/h via INTRAVENOUS

## 2015-07-27 MED ORDER — FENTANYL CITRATE (PF) 100 MCG/2ML IJ SOLN
INTRAMUSCULAR | Status: DC | PRN
  Administered 2015-07-27: 50 ug via INTRAVENOUS
  Administered 2015-07-27: 50 ug via EPIDURAL

## 2015-07-27 MED ORDER — OXYTOCIN BOLUS FROM INFUSION
500.0000 mL | INTRAVENOUS | Status: DC
  Administered 2015-07-28: 500 mL via INTRAVENOUS

## 2015-07-27 MED ORDER — OXYTOCIN 10 UNIT/ML IJ SOLN
1.0000 m[IU]/min | INTRAVENOUS | Status: DC
  Administered 2015-07-28: 2 m[IU]/min via INTRAVENOUS
  Filled 2015-07-27: qty 4

## 2015-07-27 MED ORDER — OXYCODONE-ACETAMINOPHEN 5-325 MG PO TABS: 1.0000 | ORAL_TABLET | ORAL | Status: DC | PRN

## 2015-07-27 NOTE — Anesthesia Preprocedure Evaluation (Signed)
Anesthesia Evaluation  Patient identified by MRN, date of birth, ID band Patient awake    Reviewed: Allergy & Precautions, H&P , NPO status , Patient's Chart, lab work & pertinent test results  Airway Mallampati: II  TM Distance: >3 FB Neck ROM: full    Dental no notable dental hx.    Pulmonary neg pulmonary ROS,    Pulmonary exam normal breath sounds clear to auscultation       Cardiovascular negative cardio ROS Normal cardiovascular exam     Neuro/Psych negative neurological ROS  negative psych ROS   GI/Hepatic negative GI ROS, Neg liver ROS,   Endo/Other  Morbid obesity  Renal/GU negative Renal ROS     Musculoskeletal   Abdominal (+) + obese,   Peds  Hematology negative hematology ROS (+)   Anesthesia Other Findings   Reproductive/Obstetrics (+) Pregnancy                             Anesthesia Physical Anesthesia Plan  ASA: III  Anesthesia Plan: Epidural   Post-op Pain Management:    Induction:   Airway Management Planned:   Additional Equipment:   Intra-op Plan:   Post-operative Plan:   Informed Consent: I have reviewed the patients History and Physical, chart, labs and discussed the procedure including the risks, benefits and alternatives for the proposed anesthesia with the patient or authorized representative who has indicated his/her understanding and acceptance.     Plan Discussed with:   Anesthesia Plan Comments:         Anesthesia Quick Evaluation

## 2015-07-27 NOTE — Telephone Encounter (Signed)
Pt states that she is having contractions but the time ranges, has had contractions for the last hour. Pt states that the contractions range from 2-6 minutes but none over 6 minutes apart. Pt states that she was dilated to a 1.5 when she was her last and they striped her membranes. I advised the pt to head down to [facility] for evaluation.

## 2015-07-27 NOTE — Telephone Encounter (Signed)
Pt called stating that she would like to speak with a nurse or a doctor. Pt did not state the reason why. Please contact pt

## 2015-07-27 NOTE — MAU Note (Signed)
Contractions started around 1530 have gotten stronger and closer together denies VB, LOF.

## 2015-07-27 NOTE — Progress Notes (Signed)
   Brittney Nicholson is a 25 y.o. G2P1001 at [redacted]w[redacted]d  admitted for labor with regular contractions with dilation of 4cm  Subjective: Patient with more pressure.   Objective: Filed Vitals:   07/27/15 2046 07/27/15 2047 07/27/15 2051 07/27/15 2052  BP: 132/63  112/63   Pulse: 97 61 81 108  Temp:      TempSrc:      Resp:      Height:      Weight:      SpO2: 100% 92%  99%      FHT:  FHR: 150 bpm, variability: moderate,  accelerations:  Present,  decelerations:  Absent UC:   none SVE:   Dilation: 4 Effacement (%): 90 Station: -2 Exam by:: A, Gagliardo, RN  Labs: Lab Results  Component Value Date   WBC 14.2* 07/27/2015   HGB 11.8* 07/27/2015   HCT 36.2 07/27/2015   MCV 76.9* 07/27/2015   PLT 169 07/27/2015    Assessment / Plan: Spontaneous labor, progressing normally  Labor: Progressing normally  AROM at 2217 with clear/yellow fluid and fetal scalp electrode placed. Fetal Wellbeing:  Category I Pain Control:  Epidural Anticipated MOD:  NSVD  Brittney Nicholson 07/27/2015, 9:23 PM

## 2015-07-27 NOTE — H&P (Signed)
Brittney Nicholson is a 25 y.o. female G2P1001 with IUP at [redacted]w[redacted]d presenting for contractions. Pt states she has been having irregular, every 3-5 minutes contractions, associated with none vaginal bleeding for 4 hours..  Membranes are intact, with active fetal movement.   [facility] since 16 wks  Prenatal History/Complications:  Late care--16 weeks  Past Medical History: Past Medical History  Diagnosis Date  . HSV-2 infection complicating pregnancy   . Warts   . 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 Pregnant 12/13/2014    Past Surgical History: Past Surgical History  Procedure Laterality Date  . Tonsillectomy    . Tonsillectomy  2011  . Right arm Right     has pins.  . Cholecystectomy N/A 01/25/2013    Procedure: LAPAROSCOPIC CHOLECYSTECTOMY;  Surgeon: Dalia Heading, MD;  Location: [facility];  Service: General;  Laterality: N/A;  . Breast lumpectomy  2006    Obstetrical History: OB History    Gravida Para Term Preterm AB TAB SAB Ectopic Multiple Living   0 0 0 0 0 0 1       Social History: Social History   Social History  . Marital Status: Single    Spouse Name: N/A  . Number of Children: N/A  . Years of Education: N/A   Social History Main Topics  . Smoking status: Never Smoker   . Smokeless tobacco: Never Used  . Alcohol Use: No  . Drug Use: No  . Sexual Activity: Yes    Birth Control/ Protection: None   Other Topics Concern  . None   Social History Narrative   ** Merged History Encounter **        Family History: Family History  Problem Relation Age of Onset  . Cancer Mother     uterine  . Diabetes Father   . Kidney failure Father     Allergies: Allergies  Allergen Reactions  . Gluten Other (See Comments)    RECTAL BLEEDING  . Wheat Other (See Comments)    Rectal bleeding    Prescriptions prior to admission  Medication Sig Dispense Refill Last Dose  . acetaminophen (TYLENOL 8 HOUR) 650 MG CR tablet Take 1 tablet (650 mg total) by  mouth every 8 (eight) hours as needed for pain. 30 tablet 0 07/27/2015 at 1430  . acyclovir (ZOVIRAX) 400 MG tablet Take 1 tablet (400 mg total) by mouth 3 (three) times daily. 90 tablet 3 07/27/2015 at 0900  . Prenatal Vit-Fe Fumarate-FA (PRENATAL VITAMIN PO) Take 1 tablet by mouth daily.    07/27/2015 at 0900     Prenatal Transfer Tool  Maternal Diabetes: No Genetic Screening: Declined Maternal Ultrasounds/Referrals: Normal Fetal Ultrasounds or other Referrals:  None Maternal Substance Abuse:  + THC early pg, + oxycodone last week Significant Maternal Medications:  None Significant Maternal Lab Results: Lab values include: Group B Strep negative, Other: + CF carrier--FOB declined testing     Review of Systems   Constitutional: Negative for fever and chills Eyes: Negative for visual disturbances Respiratory: Negative for shortness of breath, dyspnea Cardiovascular: Negative for chest pain or palpitations  Gastrointestinal: Negative for vomiting, diarrhea and constipation.  POSITIVE for abdominal pain (contractions) Genitourinary: Negative for dysuria and urgency Musculoskeletal: Negative for back pain, joint pain, myalgias  Neurological: Negative for dizziness and headaches      Blood pressure 120/70, pulse 99, temperature 98.4 F (36.9 C), temperature source Oral, resp. rate 20, height  (1.753 m), weight 296  lb (134.265 kg), last menstrual period 10/06/2014. General appearance: alert, cooperative and no distress Lungs: clear to auscultation bilaterally Heart: regular rate and rhythm Abdomen: soft, non-tender; bowel sounds normal Extremities: Homans sign is negative, no sign of DVT DTR's 2+ Presentation: cephalic Fetal monitoring  Baseline: 150 bpm, Variability: Good {> 6 bpm), Accelerations: Reactive and Decelerations: Absent Uterine activity  q 4-5  Dilation: 4 Effacement (%): 80 Station: -1 Exam by:: A, Gagliardo, RN   Prenatal labs: ABO, Rh: --/--/O POS  (02/16 1924) Antibody: NEG (02/16 1924) Rubella: !Error!immune RPR: Non Reactive (01/04 0848)  HBsAg: Negative (01/04 1610)  HIV: Non Reactive (01/04 0848)  GBS: Negative (01/26 1030)     Results for orders placed or performed during the hospital encounter of 07/27/15 (from the past 24 hour(s))  CBC   Collection Time: 07/27/15  7:24 PM  Result Value Ref Range   WBC 14.2 (H) 4.0 - 10.5 K/uL   RBC 4.71 3.87 - 5.11 MIL/uL   Hemoglobin 11.8 (L) 12.0 - 15.0 g/dL   HCT 96.0 45.4 - 09.8 %   MCV 76.9 (L) 78.0 - 100.0 fL   MCH 25.1 (L) 26.0 - 34.0 pg   MCHC 32.6 30.0 - 36.0 g/dL   RDW 11.9 14.7 - 82.9 %   Platelets 169 150 - 400 K/uL  Type and screen [facility]   Collection Time: 07/27/15  7:24 PM  Result Value Ref Range   ABO/RH(D) O POS    Antibody Screen NEG    Sample Expiration 07/30/2015     Clinic [facility]  Initiated Care at  16 weeks  FOB Ryan catlett  Dating By 8 week [place]  Pap normal  GC/CT Initial:           -/-     36+wks:-/-  Genetic Screen NT/IT: too late,  AFP: never went to lab  CF screen + prev preg   FOB: declines testing  Anatomic [place] Normal female '[name]'  Flu vaccine 07/19/2015   Tdap Recommended ~ 28wks  Glucose Screen  2 hrb 88/175/113  GBS neg  Feed Preference breast  Contraception Mirena  Circumcision n/a  Childbirth Classes no  Pediatrician [facility]    Assessment: Brittney Nicholson is a 25 y.o. G2P1001 with an IUP at [redacted]w[redacted]d presenting for active labor  Plan: #Labor: expectant management #Pain:  Per request #FWB Cat 1 #ID: GBS: neg  #MOF:  breast #MOC: Mirena #Circ: n/a   CRESENZO-DISHMAN,Elnita Surprenant 07/27/2015, 8:34 PM

## 2015-07-27 NOTE — Anesthesia Procedure Notes (Signed)
Epidural Patient location during procedure: OB Start time: 07/27/2015 8:40 PM End time: 07/27/2015 8:44 PM  Staffing Anesthesiologist: Leilani Able Performed by: anesthesiologist   Preanesthetic Checklist Completed: patient identified, surgical consent, pre-op evaluation, timeout performed, IV checked, risks and benefits discussed and monitors and equipment checked  Epidural Patient position: sitting Prep: site prepped and draped and DuraPrep Patient monitoring: continuous pulse ox and blood pressure Approach: midline Location: L3-L4 Injection technique: LOR air  Needle:  Needle type: Tuohy  Needle gauge: 17 G Needle length: 9 cm and 9 Needle insertion depth: 7 cm Catheter type: closed end flexible Catheter size: 19 Gauge Catheter at skin depth: 12 cm Test dose: negative and Other  Assessment Sensory level: T9 Events: blood not aspirated, injection not painful, no injection resistance, negative IV test and no paresthesia  Additional Notes Reason for block:procedure for pain

## 2015-07-27 NOTE — Progress Notes (Signed)
   Brittney Nicholson is a 25 y.o. G2P1001 at [redacted]w[redacted]d  admitted for active labor  Subjective:  Ctx are still painful. Anesthesia is working with her to get her comfortable Objective: Filed Vitals:   07/27/15 2225 07/27/15 2228 07/27/15 2231 07/27/15 2233  BP: 126/53  135/60   Pulse: 87 88 89 89  Temp:      TempSrc:      Resp:      Height:      Weight:      SpO2:  98%  99%      FHT:  FHR: 140 bpm, variability: moderate,  accelerations:  Present,  decelerations:  Absent UC:   irregular, every 3-5 minutes SVE:   Dilation: 4 Effacement (%): 90 Station: -2 Exam by:: A, Gagliardo, RN   Labs: Lab Results  Component Value Date   WBC 14.2* 07/27/2015   HGB 11.8* 07/27/2015   HCT 36.2 07/27/2015   MCV 76.9* 07/27/2015   PLT 169 07/27/2015    Assessment / Plan: Protracted latent phase Will augment with pitocin Labor: stalling out Fetal Wellbeing:  Category I Pain Control:  Epidural Anticipated MOD:  NSVD  CRESENZO-DISHMAN,Jadin Creque 07/27/2015, 11:04 PM

## 2015-07-28 ENCOUNTER — Encounter (HOSPITAL_COMMUNITY): Payer: Self-pay

## 2015-07-28 DIAGNOSIS — O9832 Other infections with a predominantly sexual mode of transmission complicating childbirth: Secondary | ICD-10-CM

## 2015-07-28 DIAGNOSIS — O99214 Obesity complicating childbirth: Secondary | ICD-10-CM

## 2015-07-28 DIAGNOSIS — A6000 Herpesviral infection of urogenital system, unspecified: Secondary | ICD-10-CM

## 2015-07-28 DIAGNOSIS — Z3A39 39 weeks gestation of pregnancy: Secondary | ICD-10-CM

## 2015-07-28 LAB — RPR: RPR: NONREACTIVE

## 2015-07-28 MED ORDER — BISACODYL 10 MG RE SUPP: 10.0000 mg | Freq: Every day | RECTAL | Status: DC | PRN

## 2015-07-28 MED ORDER — METHYLERGONOVINE MALEATE 0.2 MG/ML IJ SOLN: 0.2000 mg | INTRAMUSCULAR | Status: DC | PRN

## 2015-07-28 MED ORDER — ONDANSETRON HCL 4 MG/2ML IJ SOLN: 4.0000 mg | INTRAMUSCULAR | Status: DC | PRN

## 2015-07-28 MED ORDER — MEASLES, MUMPS & RUBELLA VAC ~~LOC~~ INJ
0.5000 mL | INJECTION | Freq: Once | SUBCUTANEOUS | Status: DC
  Filled 2015-07-28: qty 0.5

## 2015-07-28 MED ORDER — SENNOSIDES-DOCUSATE SODIUM 8.6-50 MG PO TABS
2.0000 | ORAL_TABLET | ORAL | Status: DC
  Administered 2015-07-28: 2 via ORAL
  Filled 2015-07-28: qty 2

## 2015-07-28 MED ORDER — ONDANSETRON HCL 4 MG PO TABS: 4.0000 mg | ORAL_TABLET | ORAL | Status: DC | PRN

## 2015-07-28 MED ORDER — ACETAMINOPHEN 325 MG PO TABS
650.0000 mg | ORAL_TABLET | ORAL | Status: DC | PRN
  Administered 2015-07-28 (×2): 650 mg via ORAL
  Filled 2015-07-28 (×2): qty 2

## 2015-07-28 MED ORDER — DIPHENHYDRAMINE HCL 25 MG PO CAPS: 25.0000 mg | ORAL_CAPSULE | Freq: Four times a day (QID) | ORAL | Status: DC | PRN

## 2015-07-28 MED ORDER — SIMETHICONE 80 MG PO CHEW
80.0000 mg | CHEWABLE_TABLET | ORAL | Status: DC | PRN
  Administered 2015-07-28: 80 mg via ORAL
  Filled 2015-07-28: qty 1

## 2015-07-28 MED ORDER — BENZOCAINE-MENTHOL 20-0.5 % EX AERO
1.0000 "application " | INHALATION_SPRAY | CUTANEOUS | Status: DC | PRN
  Administered 2015-07-28: 1 via TOPICAL
  Filled 2015-07-28: qty 56

## 2015-07-28 MED ORDER — FLEET ENEMA 7-19 GM/118ML RE ENEM: 1.0000 | ENEMA | Freq: Every day | RECTAL | Status: DC | PRN

## 2015-07-28 MED ORDER — LANOLIN HYDROUS EX OINT: TOPICAL_OINTMENT | CUTANEOUS | Status: DC | PRN

## 2015-07-28 MED ORDER — IBUPROFEN 600 MG PO TABS
600.0000 mg | ORAL_TABLET | Freq: Four times a day (QID) | ORAL | Status: DC
  Administered 2015-07-28 – 2015-07-29 (×6): 600 mg via ORAL
  Filled 2015-07-28 (×6): qty 1

## 2015-07-28 MED ORDER — ZOLPIDEM TARTRATE 5 MG PO TABS: 5.0000 mg | ORAL_TABLET | Freq: Every evening | ORAL | Status: DC | PRN

## 2015-07-28 MED ORDER — PRENATAL MULTIVITAMIN CH
1.0000 | ORAL_TABLET | Freq: Every day | ORAL | Status: DC
  Administered 2015-07-28 – 2015-07-29 (×2): 1 via ORAL
  Filled 2015-07-28 (×2): qty 1

## 2015-07-28 MED ORDER — FERROUS SULFATE 325 (65 FE) MG PO TABS
325.0000 mg | ORAL_TABLET | Freq: Two times a day (BID) | ORAL | Status: DC
  Administered 2015-07-28 – 2015-07-29 (×3): 325 mg via ORAL
  Filled 2015-07-28 (×3): qty 1

## 2015-07-28 MED ORDER — TETANUS-DIPHTH-ACELL PERTUSSIS 5-2.5-18.5 LF-MCG/0.5 IM SUSP
0.5000 mL | Freq: Once | INTRAMUSCULAR | Status: AC
  Administered 2015-07-29: 0.5 mL via INTRAMUSCULAR
  Filled 2015-07-28: qty 0.5

## 2015-07-28 MED ORDER — OXYCODONE-ACETAMINOPHEN 5-325 MG PO TABS
1.0000 | ORAL_TABLET | Freq: Four times a day (QID) | ORAL | Status: DC | PRN
  Administered 2015-07-28 – 2015-07-29 (×4): 1 via ORAL
  Filled 2015-07-28 (×4): qty 1

## 2015-07-28 MED ORDER — DIBUCAINE 1 % RE OINT
1.0000 | TOPICAL_OINTMENT | RECTAL | Status: DC | PRN
Start: 2015-07-28 — End: 2015-07-29

## 2015-07-28 MED ORDER — BUPIVACAINE HCL (PF) 0.5 % IJ SOLN
INTRAMUSCULAR | Status: DC | PRN
  Administered 2015-07-28: 5 mL

## 2015-07-28 MED ORDER — METHYLERGONOVINE MALEATE 0.2 MG PO TABS: 0.2000 mg | ORAL_TABLET | ORAL | Status: DC | PRN

## 2015-07-28 MED ORDER — WITCH HAZEL-GLYCERIN EX PADS: 1.0000 "application " | MEDICATED_PAD | CUTANEOUS | Status: DC | PRN

## 2015-07-28 NOTE — Progress Notes (Signed)
Post Partum Day 0  Subjective: no complaints, up ad lib, voiding, tolerating PO and + flatus  Objective: Blood pressure 108/49, pulse 108, temperature 98.1 F (36.7 C), temperature source Oral, resp. rate 18, height  (1.753 m), weight 134.265 kg (296 lb), last menstrual period 10/06/2014, SpO2 98 %, unknown if currently breastfeeding.  Physical Exam:  General: alert and cooperative Lochia: appropriate Uterine Fundus: firm DVT Evaluation: No evidence of DVT seen on physical exam. Negative Homan's sign. No cords or calf tenderness. No significant calf/ankle edema.   Recent Labs  07/27/15 1924  HGB 11.8*  HCT 36.2    Assessment/Plan: Plan for discharge tomorrow, Breastfeeding and Contraception Mirena   LOS: 1 day   Crystal Dorsey 07/28/2015, 7:28 AM   I have seen and examined this patient and agree the above assessment.  Respiratory effort normal, lochia appropriate, legs negative,  pain level normal.  CRESENZO-DISHMAN,Kinley Ferrentino 08/01/2015 11:37 AM

## 2015-07-28 NOTE — Anesthesia Postprocedure Evaluation (Signed)
Anesthesia Post Note  Patient: Brittney Nicholson  Procedure(s) Performed: * No procedures listed *  Patient location during evaluation: Mother Baby Anesthesia Type: Epidural Level of consciousness: awake, awake and alert and oriented Pain management: pain level not controlled [name] she called RN X2 for pain med, no response. I asked RN to give pain med) Vital Signs Assessment: post-procedure vital signs reviewed and stable Respiratory status: spontaneous breathing, nonlabored ventilation and respiratory function stable Cardiovascular status: stable Postop Assessment: no headache, patient able to bend at knees, no signs of nausea or vomiting and adequate PO intake (Has backache) Anesthetic complications: no Comments: Patient said epidural did not work. Bolus given once, then anesthesiologist came but patient was complete at that point. Patient says only legs got a bit numb and no pain relief from epidural.    Last Vitals:  Filed Vitals:   07/28/15 0419 07/28/15 0744  BP: 108/49 115/59  Pulse: 108 91  Temp: 36.7 C 36.6 C  Resp: 18 18    Last Pain:  Filed Vitals:   07/28/15 0745  PainSc: 5                  [name]

## 2015-07-28 NOTE — Lactation Note (Signed)
This note was copied from a baby's chart. Lactation Consultation Note  P2, BF first child for 2 weeks difficult latch and states she pumped until her "milk dried up". Mother states she recently attempted breastfeeding but baby licked and did not latch so she hand expressed from both breasts and gave to baby. Attempted latching in football hold on L side but baby did not latch at this time.  Demonstrated how to compress breast to achieve a deeper latch. Encouraged mother to keep trying and unwrap baby for feedings. During consult family entered to see new baby. Mom made aware of O/P services, breastfeeding support groups, community resources, and our phone # for post-discharge questions.  Encouraged her to call for further assistance.     Patient Name: Brittney Nicholson ONGEX'B Date: 07/28/2015 Reason for consult: Initial assessment   Maternal Data Has patient been taught Hand Expression?: Yes Does the patient have breastfeeding experience prior to this delivery?: Yes  Feeding Feeding Type: Breast Fed  LATCH Score/Interventions Latch: Repeated attempts needed to sustain latch, nipple held in mouth throughout feeding, stimulation needed to elicit sucking reflex.  Audible Swallowing: None  Type of Nipple: Everted at rest and after stimulation  Comfort (Breast/Nipple): Soft / non-tender     Hold (Positioning): Assistance needed to correctly position infant at breast and maintain latch.  LATCH Score: 6  Lactation Tools Discussed/Used     Consult Status Consult Status: Follow-up Date: 07/29/15 Follow-up type: In-patient    Dahlia Byes [facility] 07/28/2015, 10:31 AM

## 2015-07-28 NOTE — Progress Notes (Signed)
UR chart review completed.  

## 2015-07-29 MED ORDER — IBUPROFEN 600 MG PO TABS: 600.0000 mg | ORAL_TABLET | Freq: Four times a day (QID) | ORAL | Status: DC | PRN

## 2015-07-29 NOTE — Discharge Summary (Signed)
Obstetric Discharge Summary Reason for Admission: onset of labor Prenatal Procedures: none Intrapartum Procedures: spontaneous vaginal delivery Postpartum Procedures: none Complications-Operative and Postpartum: none HEMOGLOBIN  Date Value Ref Range Status  07/27/2015 11.8* 12.0 - 15.0 g/dL Final   HCT  Date Value Ref Range Status  07/27/2015 36.2 36.0 - 46.0 % Final   HEMATOCRIT  Date Value Ref Range Status  06/14/2015 35.0 34.0 - 46.6 % Final    Discharge Diagnoses: Term Pregnancy-delivered  Hospital Course:  Brittney Nicholson is a 25 y.o. [id number] who presented with irregular contractions noted to be 4cm dilated.  She had a uncomplicated SVD . She was able to ambulate, tolerate PO and void normally. She was discharged home with instructions for postpartum care.    Delivery Note Called to patient's room as she was actively pushing with great progress.  At 12:54 AM a viable female was delivered via Vaginal, Spontaneous Delivery (Presentation: LOA; ). APGAR: 8, 9; weight pending .  Placenta status: Intact, Spontaneous. Cord: 3 vessels with the following complications: None. Cord pH: pending. Cord blood was obtained by Brittney Nicholson.   Anesthesia: Epidural  Episiotomy: None Lacerations: None Suture Repair: N/A Est. Blood Loss (mL): 150  Mom to postpartum. Baby to Couplet care / Skin to Skin.   Physical Exam:  General: alert and cooperative Lochia: appropriate Uterine Fundus: firm DVT Evaluation: No evidence of DVT seen on physical exam. Negative Homan's sign. No cords or calf tenderness.  Discharge Information: Date: 07/29/2015 Activity: pelvic rest Diet: routine Medications: PNV and Ibuprofen Baby feeding: plans to breastfeed, plans to bottle feed Contraception: IUD, Mirena Condition: stable Instructions: refer to practice specific booklet Discharge to: home   Newborn Data: Live born female  Birth Weight: 7 lb 13.2 oz (3549 g) APGAR: 8,  9  Home with mother.  Brittney Puff, MD [facility] PGY-1 07/29/2015, 6:33 AM   CNM attestation I have seen and examined this patient and agree with above documentation in the resident's note.   Brittney Nicholson is a 25 y.o. [id number] s/p SVD.   Pain is well controlled.  Plan for birth control is IUD.  Method of Feeding: breast  PE:  BP 102/58 mmHg  Pulse 79  Temp(Src) 97.5 F (36.4 C) (Oral)  Resp 18  Ht  (1.753 m)  Wt 134.265 kg (296 lb)  BMI 43.69 kg/m2  SpO2 100%  LMP 10/06/2014  Breastfeeding? Unknown Fundus firm   Recent Labs  07/27/15 1924  HGB 11.8*  HCT 36.2     Plan: discharge today - postpartum care discussed - f/u clinic in 6 weeks for postpartum visit   Brittney Nicholson, CNM 8:51 AM  07/29/2015

## 2015-07-29 NOTE — Discharge Instructions (Signed)

## 2015-07-31 ENCOUNTER — Telehealth: Payer: Self-pay | Admitting: Obstetrics & Gynecology

## 2015-07-31 NOTE — Telephone Encounter (Signed)
Pt states she had a vaginal delivery on 2/17 and is having pain all over, she states the ibuprofen  is not helping with the pain at all and she is requesting a different pain medication be sent to [facility] in [place].  Please advise.

## 2015-08-01 ENCOUNTER — Telehealth: Payer: Self-pay | Admitting: Obstetrics & Gynecology

## 2015-08-01 MED ORDER — HYDROCODONE-ACETAMINOPHEN 5-325 MG PO TABS: 1.0000 | ORAL_TABLET | Freq: Four times a day (QID) | ORAL | Status: DC | PRN

## 2015-08-01 NOTE — Telephone Encounter (Signed)
Attempted to call pt to let her know she can pick up RX for pain medication, her phone # has changed or is no longer in service.  I left a message on her moms # and another # that was in her contacts.

## 2015-08-02 ENCOUNTER — Encounter: Payer: Medicaid Other | Admitting: Advanced Practice Midwife

## 2015-08-03 ENCOUNTER — Encounter: Payer: Self-pay | Admitting: Obstetrics and Gynecology

## 2015-08-03 ENCOUNTER — Telehealth: Payer: Self-pay | Admitting: Advanced Practice Midwife

## 2015-08-03 ENCOUNTER — Ambulatory Visit (INDEPENDENT_AMBULATORY_CARE_PROVIDER_SITE_OTHER): Payer: Medicaid Other | Admitting: Obstetrics and Gynecology

## 2015-08-03 VITALS — BP 92/60 | Temp 98.6°F | Ht 69.0 in | Wt 285.0 lb

## 2015-08-03 DIAGNOSIS — R300 Dysuria: Secondary | ICD-10-CM | POA: Diagnosis not present

## 2015-08-03 DIAGNOSIS — R350 Frequency of micturition: Secondary | ICD-10-CM | POA: Diagnosis not present

## 2015-08-03 DIAGNOSIS — R509 Fever, unspecified: Secondary | ICD-10-CM | POA: Diagnosis not present

## 2015-08-03 DIAGNOSIS — R3915 Urgency of urination: Secondary | ICD-10-CM | POA: Diagnosis not present

## 2015-08-03 LAB — POCT URINALYSIS DIPSTICK
Glucose, UA: NEGATIVE
KETONES UA: NEGATIVE
Nitrite, UA: NEGATIVE
PROTEIN UA: NEGATIVE
RBC UA: 4

## 2015-08-03 MED ORDER — AMOXICILLIN 500 MG PO CAPS: 500.0000 mg | ORAL_CAPSULE | Freq: Three times a day (TID) | ORAL | Status: DC

## 2015-08-03 NOTE — Telephone Encounter (Signed)
Pt states that she delivered on the 17th and now having UTI symptoms. Pt states that she has had fever, pain and burning with urination and now having burning even without urination, and she has a constant urge to go. I advised the pt that she would need to be seen to be checked out. Pt verbalized understanding, call was switched to front office and appointment was given for today.

## 2015-08-03 NOTE — Progress Notes (Signed)
Patient ID: DALEEN STEINHAUS, female   DOB: 21-Mar-1991, 25 y.o.   MRN: [id number]  Work in pt   [facility] Visit  Patient name: Brittney Nicholson MRN [id number]  Date of birth: 19-Nov-1990  CC & HPI:  Brittney Nicholson is a 25 y.o. female 6 days s/p normal vaginal delivery presenting today for dysuria with associated frequency, urgency, fever (Tmax 102). She also complains of some lower back pain at the site of her epidural. She reports that she has taken tylenol and ibuprofen with temporary relief of fever. Pt also complains of tenderness at the site of an accessory duct in her right axilla, which is moderately relieved after breastfeeding or pumping. Pt states she plans on getting an IUD. She denies breast tenderness, tenderness of the left axilla.   ROS:  A complete 10 system review of systems was obtained and all systems are negative except as noted in the HPI and PMH.    Pertinent History Reviewed:   Reviewed: Significant for HSV-2  Medical         Past Medical History  Diagnosis Date  . HSV-2 infection complicating pregnancy   . Warts   . 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 Pregnant 12/13/2014                              Surgical Hx:    Past Surgical History  Procedure Laterality Date  . Tonsillectomy    . Tonsillectomy  2011  . Right arm Right     has pins.  . Cholecystectomy N/A 01/25/2013    Procedure: LAPAROSCOPIC CHOLECYSTECTOMY;  Surgeon: Dalia Heading, MD;  Location: [facility];  Service: General;  Laterality: N/A;  . Breast lumpectomy  2006   Medications: Reviewed & Updated - see associated section                       Current outpatient prescriptions:  .  acyclovir (ZOVIRAX) 400 MG tablet, Take 1 tablet (400 mg total) by mouth 3 (three) times daily., Disp: 90 tablet, Rfl: 3 .  ibuprofen (ADVIL,MOTRIN) 600 MG tablet, Take 1 tablet (600 mg total) by mouth every 6 (six) hours as needed for moderate pain or cramping., Disp: 30 tablet, Rfl: 0 .  Prenatal Vit-Fe  Fumarate-FA (PRENATAL VITAMIN PO), Take 1 tablet by mouth daily. , Disp: , Rfl:  .  HYDROcodone-acetaminophen (NORCO/VICODIN) 5-325 MG tablet, Take 1 tablet by mouth every 6 (six) hours as needed. (Patient not taking: Reported on 08/03/2015), Disp: 30 tablet, Rfl: 0   Social History: Reviewed -  reports that she has never smoked. She has never used smokeless tobacco.  Objective Findings:  Vitals: Blood pressure 92/60, temperature 98.6 F (37 C), height  (1.753 m), weight 285 lb (129.275 kg), last menstrual period 10/06/2014, currently breastfeeding.  Physical Examination: General appearance - alert, well appearing, and in no distress Abdomen - soft, nontender, nondistended, no masses or organomegaly Back- no CVA tenderness  Pelvic - normal external genitalia, vulva, vagina, cervix, uterus and adnexa,  VULVA: normal appearing vulva with no masses, tenderness or lesions,  VAGINA: normal appearing vagina with normal color and discharge, no lesions, normal lochia, non purulent   CERVIX: normal appearing cervix without discharge or lesions,  UTERUS: uterus is normal size, tiny, shape, consistency and nontender,  ADNEXA: normal adnexa in size, nontender and no masses    Assessment & Plan:  A:  1. UA with 2+ leukocytes, 4+ blood 2. Possible UTI 3. Right axilla accessory breast tissue, may explain fever.   P:  1. Will prescribe 5-7 days amoxicillin 2. Follow up PRN     By signing my name below, I, Doreatha Martin, attest that this documentation has been prepared under the direction and in the presence of Tilda Burrow, MD. Electronically Signed: Doreatha Martin, ED Scribe. 08/03/2015. 10:23 AM.  I personally performed the services described in this documentation, which was SCRIBED in my presence. The recorded information has been reviewed and considered accurate. It has been edited as necessary during review. Tilda Burrow, MD

## 2015-08-03 NOTE — Progress Notes (Signed)
Patient ID: Brittney Nicholson, female   DOB: 05-Oct-1990, 25 y.o.   MRN: [id number] Pt worked in for possible UTI. Pt states that she has constant burning and fever, urge to void all the time.

## 2015-08-04 ENCOUNTER — Telehealth: Payer: Self-pay | Admitting: Obstetrics & Gynecology

## 2015-08-04 NOTE — Telephone Encounter (Signed)
Pt states is it safe to take Hydrocodone while breastfeeding? Pt informed per Dr. Despina Hidden, safe to take Hydrocodone while breastfeeding. Pt verbalized understanding.

## 2015-08-05 ENCOUNTER — Inpatient Hospital Stay (HOSPITAL_COMMUNITY)
Admission: RE | Admit: 2015-08-05 | Discharge: 2015-08-05 | Disposition: A | Discharge status: Home / Self Care | Payer: Medicaid Other | Source: Ambulatory Visit | Attending: Obstetrics and Gynecology | Admitting: Obstetrics and Gynecology

## 2015-08-30 ENCOUNTER — Encounter: Payer: Self-pay | Admitting: Advanced Practice Midwife

## 2015-08-30 ENCOUNTER — Ambulatory Visit (INDEPENDENT_AMBULATORY_CARE_PROVIDER_SITE_OTHER): Payer: Medicaid Other | Admitting: Advanced Practice Midwife

## 2015-08-30 VITALS — BP 112/64 | HR 76 | Wt 268.0 lb

## 2015-08-30 DIAGNOSIS — F53 Postpartum depression: Secondary | ICD-10-CM

## 2015-08-30 DIAGNOSIS — O99345 Other mental disorders complicating the puerperium: Principal | ICD-10-CM

## 2015-08-30 MED ORDER — CLONAZEPAM 0.5 MG PO TABS: 0.5000 mg | ORAL_TABLET | Freq: Two times a day (BID) | ORAL | Status: DC | PRN

## 2015-08-30 MED ORDER — SERTRALINE HCL 50 MG PO TABS: 50.0000 mg | ORAL_TABLET | Freq: Every day | ORAL | Status: DC

## 2015-08-30 NOTE — Progress Notes (Signed)
[facility] Visit  Patient name: Brittney Nicholson MRN [id number]  Date of birth: 11/12/1990  CC & HPI:  Brittney Nicholson is a 25 y.o. Caucasian female presenting today for depression and anxiety.  She had a SVD a month ago.  Pt has never suffered from anxiety or depression (states was on lexapro 3 months after last kid, but felt she was just overly emotional and cried a lot, even when she wasn't sad). PPD screen scored 22.  She specifically denies any plan of hurting herself or anyone else (including kids).  Agrees that if she starts to develop a plan, she will let her mom, boyfriend, or [facility] know.  Has panic attacks nearly every night (is alone while FOB works 3rd shift).  Isn't eating, has trouble even taking a shower.  Plans to quit breastfeeding. Mom is supportive.    Pertinent History Reviewed:  Medical & Surgical Hx:   Past Medical History  Diagnosis Date  . HSV-2 infection complicating pregnancy   . Warts   . 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 Pregnant 12/13/2014   Past Surgical History  Procedure Laterality Date  . Tonsillectomy    . Tonsillectomy  2011  . Right arm Right     has pins.  . Cholecystectomy N/A 01/25/2013    Procedure: LAPAROSCOPIC CHOLECYSTECTOMY;  Surgeon: Dalia Heading, MD;  Location: [facility];  Service: General;  Laterality: N/A;  . Breast lumpectomy  2006   Family History  Problem Relation Age of Onset  . Cancer Mother     uterine  . Diabetes Father   . Kidney failure Father     Current outpatient prescriptions:  .  FENUGREEK PO, Take by mouth., Disp: , Rfl:  .  Prenatal Vit-Fe Fumarate-FA (PRENATAL VITAMIN PO), Take 1 tablet by mouth daily. , Disp: , Rfl:  .  acyclovir (ZOVIRAX) 400 MG tablet, Take 1 tablet (400 mg total) by mouth 3 (three) times daily. (Patient not taking: Reported on 08/30/2015), Disp: 90 tablet, Rfl: 3 .  amoxicillin (AMOXIL) 500 MG capsule, Take 1 capsule (500 mg total) by mouth 3 (three) times daily. (Patient not taking:  Reported on 08/30/2015), Disp: 15 capsule, Rfl: 0 .  clonazePAM (KLONOPIN) 0.5 MG tablet, Take 1 tablet (0.5 mg total) by mouth 2 (two) times daily as needed for anxiety., Disp: 30 tablet, Rfl: 0 .  HYDROcodone-acetaminophen (NORCO/VICODIN) 5-325 MG tablet, Take 1 tablet by mouth every 6 (six) hours as needed. (Patient not taking: Reported on 08/03/2015), Disp: 30 tablet, Rfl: 0 .  ibuprofen (ADVIL,MOTRIN) 600 MG tablet, Take 1 tablet (600 mg total) by mouth every 6 (six) hours as needed for moderate pain or cramping. (Patient not taking: Reported on 08/30/2015), Disp: 30 tablet, Rfl: 0 .  sertraline (ZOLOFT) 50 MG tablet, Take 1 tablet (50 mg total) by mouth daily. Start with 1/2 a tablet a day for a week, Disp: 30 tablet, Rfl: 3 Social History: Reviewed -  reports that she has never smoked. She has never used smokeless tobacco.  Review of Systems:   Constitutional: Negative for fever and chills Eyes: Negative for visual disturbances Respiratory: Negative for shortness of breath, dyspnea Cardiovascular: Negative for chest pain or palpitations  Gastrointestinal: Negative for vomiting, diarrhea and constipation; no abdominal pain Genitourinary: Negative for dysuria and urgency, vaginal irritation or itching Musculoskeletal: Negative for back pain, joint pain, myalgias  Neurological: Negative for dizziness and headaches    Objective Findings:    Physical Examination: General  appearance - well appearing, and in no distress Mental status - alert, oriented to person, place, and time Chest:  Normal respiratory effort Heart - normal rate and regular rhythm Musculoskeletal:  Normal range of motion without pain Extremities:  No edema    No results found for this or any previous visit (from the past 24 hour(s)).    Assessment & Plan:  A:   Postpartum depression and anxiety P:  . Meds ordered this encounter  Medications  . FENUGREEK PO    Sig: Take by mouth.  . clonazePAM (KLONOPIN) 0.5  MG tablet    Sig: Take 1 tablet (0.5 mg total) by mouth 2 (two) times daily as needed for anxiety.    Dispense:  30 tablet    Refill:  0    Order Specific Question:  Supervising Provider    Answer:  Despina HiddenEURE, LUTHER H [[id number]]  . sertraline (ZOLOFT) 50 MG tablet    Sig: Take 1 tablet (50 mg total) by mouth daily. Start with 1/2 a tablet a day for a week    Dispense:  30 tablet    Refill:  3    Order Specific Question:  Supervising Provider    Answer:  Lazaro ArmsEURE, LUTHER H [[id number]]    Referral to [facility] made. Pt understands that Klonopin is for short term use, this will be the only RX  Return for As scheduled.  CRESENZO-DISHMAN,Klayten Jolliff CNM 08/30/2015 10:48 AM

## 2015-09-11 ENCOUNTER — Encounter: Payer: Self-pay | Admitting: Obstetrics & Gynecology

## 2015-09-11 ENCOUNTER — Ambulatory Visit: Payer: Medicaid Other | Admitting: Obstetrics & Gynecology

## 2016-02-09 ENCOUNTER — Telehealth: Payer: Self-pay | Admitting: Obstetrics & Gynecology

## 2016-02-09 ENCOUNTER — Other Ambulatory Visit: Payer: Self-pay | Admitting: Obstetrics & Gynecology

## 2016-02-09 MED ORDER — ACYCLOVIR 400 MG PO TABS: 400.0000 mg | ORAL_TABLET | Freq: Every day | ORAL | 3 refills | Status: DC

## 2016-02-11 ENCOUNTER — Emergency Department (HOSPITAL_COMMUNITY)
Admission: EM | Admit: 2016-02-11 | Discharge: 2016-02-11 | Disposition: A | Discharge status: Home / Self Care | Payer: Medicaid Other | Attending: Emergency Medicine | Admitting: Emergency Medicine

## 2016-02-11 ENCOUNTER — Emergency Department (HOSPITAL_COMMUNITY): Payer: Medicaid Other

## 2016-02-11 ENCOUNTER — Encounter (HOSPITAL_COMMUNITY): Payer: Self-pay

## 2016-02-11 DIAGNOSIS — Z79899 Other long term (current) drug therapy: Secondary | ICD-10-CM | POA: Insufficient documentation

## 2016-02-11 DIAGNOSIS — R569 Unspecified convulsions: Secondary | ICD-10-CM | POA: Insufficient documentation

## 2016-02-11 DIAGNOSIS — R930 Abnormal findings on diagnostic imaging of skull and head, not elsewhere classified: Secondary | ICD-10-CM | POA: Insufficient documentation

## 2016-02-11 LAB — CBC WITH DIFFERENTIAL/PLATELET
Basophils Absolute: 0 10*3/uL (ref 0.0–0.1)
Basophils Relative: 0 %
EOS ABS: 0.4 10*3/uL (ref 0.0–0.7)
Eosinophils Relative: 5 %
HCT: 41.4 % (ref 36.0–46.0)
HEMOGLOBIN: 13.3 g/dL (ref 12.0–15.0)
LYMPHS ABS: 3.1 10*3/uL (ref 0.7–4.0)
Lymphocytes Relative: 39 %
MCH: 27.1 pg (ref 26.0–34.0)
MCHC: 32.1 g/dL (ref 30.0–36.0)
MCV: 84.5 fL (ref 78.0–100.0)
Monocytes Absolute: 0.6 10*3/uL (ref 0.1–1.0)
Monocytes Relative: 8 %
NEUTROS PCT: 48 %
Neutro Abs: 3.8 10*3/uL (ref 1.7–7.7)
Platelets: 200 10*3/uL (ref 150–400)
RBC: 4.9 MIL/uL (ref 3.87–5.11)
RDW: 13.1 % (ref 11.5–15.5)
WBC: 8 10*3/uL (ref 4.0–10.5)

## 2016-02-11 LAB — COMPREHENSIVE METABOLIC PANEL
ALBUMIN: 4.5 g/dL (ref 3.5–5.0)
ALK PHOS: 49 U/L (ref 38–126)
ALT: 22 U/L (ref 14–54)
AST: 29 U/L (ref 15–41)
Anion gap: 9 (ref 5–15)
BUN: 13 mg/dL (ref 6–20)
CALCIUM: 9.3 mg/dL (ref 8.9–10.3)
CO2: 23 mmol/L (ref 22–32)
CREATININE: 0.84 mg/dL (ref 0.44–1.00)
Chloride: 105 mmol/L (ref 101–111)
GFR calc Af Amer: >60 mL/min (ref >60)
GFR calc non Af Amer: >60 mL/min (ref >60)
GLUCOSE: 93 mg/dL (ref 65–99)
Potassium: 3.3 mmol/L — ABNORMAL LOW (ref 3.5–5.1)
SODIUM: 137 mmol/L (ref 135–145)
Total Bilirubin: 0.5 mg/dL (ref 0.3–1.2)
Total Protein: 7.2 g/dL (ref 6.5–8.1)

## 2016-02-11 LAB — URINALYSIS, ROUTINE W REFLEX MICROSCOPIC
Bilirubin Urine: NEGATIVE
Glucose, UA: NEGATIVE mg/dL
KETONES UR: NEGATIVE mg/dL
NITRITE: NEGATIVE
PROTEIN: NEGATIVE mg/dL
Specific Gravity, Urine: 1.018 (ref 1.005–1.030)
pH: 6 (ref 5.0–8.0)

## 2016-02-11 LAB — PREGNANCY, URINE: Preg Test, Ur: NEGATIVE

## 2016-02-11 LAB — RAPID URINE DRUG SCREEN, HOSP PERFORMED
Amphetamines: NOT DETECTED
BARBITURATES: NOT DETECTED
Benzodiazepines: NOT DETECTED
Cocaine: NOT DETECTED
Opiates: NOT DETECTED
Tetrahydrocannabinol: NOT DETECTED

## 2016-02-11 LAB — URINE MICROSCOPIC-ADD ON

## 2016-02-11 LAB — ETHANOL

## 2016-02-11 MED ORDER — ONDANSETRON HCL 4 MG/2ML IJ SOLN
4.0000 mg | Freq: Once | INTRAMUSCULAR | Status: AC
  Administered 2016-02-11: 4 mg via INTRAVENOUS
  Filled 2016-02-11: qty 2
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
## 2016-02-11 NOTE — ED Notes (Signed)
Patient Alert and oriented X4. Stable and ambulatory. Patient verbalized understanding of the discharge instructions.  Patient belongings were taken by the patient.  
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
## 2017-10-31 ENCOUNTER — Encounter (HOSPITAL_COMMUNITY): Payer: Self-pay | Admitting: *Deleted

## 2017-10-31 ENCOUNTER — Inpatient Hospital Stay (HOSPITAL_COMMUNITY)
Admission: AD | Admit: 2017-10-31 | Discharge: 2017-10-31 | Discharge status: Against Medical Advice | Payer: Medicaid Other | Source: Ambulatory Visit | Attending: Obstetrics and Gynecology | Admitting: Obstetrics and Gynecology

## 2017-10-31 DIAGNOSIS — N898 Other specified noninflammatory disorders of vagina: Secondary | ICD-10-CM | POA: Diagnosis not present

## 2017-10-31 DIAGNOSIS — Z3201 Encounter for pregnancy test, result positive: Secondary | ICD-10-CM | POA: Insufficient documentation

## 2017-10-31 LAB — URINALYSIS, ROUTINE W REFLEX MICROSCOPIC
BILIRUBIN URINE: NEGATIVE
Glucose, UA: NEGATIVE mg/dL
HGB URINE DIPSTICK: NEGATIVE
KETONES UR: NEGATIVE mg/dL
Nitrite: NEGATIVE
Protein, ur: NEGATIVE mg/dL
SPECIFIC GRAVITY, URINE: 1.02 (ref 1.005–1.030)
pH: 6 (ref 5.0–8.0)

## 2017-10-31 LAB — CBC
HEMATOCRIT: 40.3 % (ref 36.0–46.0)
Hemoglobin: 13 g/dL (ref 12.0–15.0)
MCH: 27.5 pg (ref 26.0–34.0)
MCHC: 32.3 g/dL (ref 30.0–36.0)
MCV: 85.2 fL (ref 78.0–100.0)
Platelets: 188 10*3/uL (ref 150–400)
RBC: 4.73 MIL/uL (ref 3.87–5.11)
RDW: 13.6 % (ref 11.5–15.5)
WBC: 6.7 10*3/uL (ref 4.0–10.5)

## 2017-10-31 LAB — POCT PREGNANCY, URINE: PREG TEST UR: POSITIVE — AB

## 2017-10-31 LAB — RAPID URINE DRUG SCREEN, HOSP PERFORMED
Amphetamines: NOT DETECTED
Barbiturates: NOT DETECTED
Benzodiazepines: NOT DETECTED
Cocaine: NOT DETECTED
OPIATES: NOT DETECTED
Tetrahydrocannabinol: NOT DETECTED

## 2017-10-31 LAB — HCG, QUANTITATIVE, PREGNANCY: HCG, BETA CHAIN, QUANT, S: 80444 m[IU]/mL — AB (ref <5)

## 2017-10-31 NOTE — MAU Provider Note (Signed)
History     CSN: [id number]  Arrival date and time: 10/31/17 1000   First Provider Initiated Contact with Patient 10/31/17 1148     Chief Complaint  Patient presents with  . Vaginal Discharge  . Possible Pregnancy   HPI Brittney Nicholson is a 27 y.o. G3P2002 at unknown gestation who presents with vaginal discharge. She states last night she passed "a glob of mucous" so she came in to be evaluated. She denies any vaginal bleeding. She denies any pain. She has a history of irregular periods and thinks her last period was middle of February. She states she was seen at [facility] last week and had a annual exam but did not tell them she was pregnant. She reports intermittent nausea.   OB History    Gravida  3   Para  2   Term  2   Preterm  0   AB  0   Living  2     SAB  0   TAB  0   Ectopic  0   Multiple  0   Live Births  2           Past Medical History:  Diagnosis Date  . 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 HSV-2 infection complicating pregnancy   . Pregnant 12/13/2014  . Warts     Past Surgical History:  Procedure Laterality Date  . BREAST LUMPECTOMY  2006  . CHOLECYSTECTOMY N/A 01/25/2013   Procedure: LAPAROSCOPIC CHOLECYSTECTOMY;  Surgeon: Dalia Heading, MD;  Location: [facility];  Service: General;  Laterality: N/A;  . right arm Right    has pins.  . TONSILLECTOMY    . TONSILLECTOMY  2011    Family History  Problem Relation Age of Onset  . Cancer Mother        uterine  . Diabetes Father   . Kidney failure Father     Social History   Tobacco Use  . Smoking status: Never Smoker  . Smokeless tobacco: Never Used  Substance Use Topics  . Alcohol use: No  . Drug use: No    Allergies:  Allergies  Allergen Reactions  . Gluten Other (See Comments)    RECTAL BLEEDING  . Wheat Other (See Comments)    Rectal bleeding    Medications Prior to Admission  Medication Sig Dispense Refill Last Dose  . acetaminophen (TYLENOL) 325 MG tablet Take 325 mg by  mouth every 6 (six) hours as needed for mild pain.   Past Week at Unknown time  . loperamide (IMODIUM) 2 MG capsule Take 2 mg by mouth as needed for diarrhea or loose stools.   Past Week at Unknown time  . Prenatal Vit-Fe Fumarate-FA (PRENATAL VITAMIN PO) Take 1 tablet by mouth daily.    10/31/2017 at Unknown time    Review of Systems  Constitutional: Negative.  Negative for fatigue and fever.  HENT: Negative.   Respiratory: Negative.  Negative for shortness of breath.   Cardiovascular: Negative.  Negative for chest pain.  Gastrointestinal: Negative.  Negative for abdominal pain, constipation, diarrhea, nausea and vomiting.  Genitourinary: Positive for vaginal discharge. Negative for dysuria and vaginal bleeding.  Neurological: Negative.  Negative for dizziness and headaches.   Physical Exam   Blood pressure (!) 106/54, pulse 93, temperature 98.8 F (37.1 C), temperature source Oral, resp. rate 16, height  (1.702 m), weight 216 lb (98 kg), SpO2 100 %, currently breastfeeding.  Physical Exam  Nursing note and vitals reviewed. Constitutional:  She is oriented to person, place, and time. She appears well-developed and well-nourished. No distress.  HENT:  Head: Normocephalic.  Eyes: Pupils are equal, round, and reactive to light.  Cardiovascular: Normal rate, regular rhythm and normal heart sounds.  Respiratory: Effort normal and breath sounds normal. No respiratory distress.  GI: Soft. Bowel sounds are normal. She exhibits no distension. There is no tenderness.  Neurological: She is alert and oriented to person, place, and time.  Skin: Skin is warm and dry.  Psychiatric: She has a normal mood and affect. Her behavior is normal. Judgment and thought content normal.    MAU Course  Procedures Results for orders placed or performed during the hospital encounter of 10/31/17 (from the past 24 hour(s))  Urinalysis, Routine w reflex microscopic     Status: Abnormal   Collection Time:  10/31/17 10:20 AM  Result Value Ref Range   Color, Urine YELLOW YELLOW   APPearance CLOUDY (A) CLEAR   Specific Gravity, Urine 1.020 1.005 - 1.030   pH 6.0 5.0 - 8.0   Glucose, UA NEGATIVE NEGATIVE mg/dL   Hgb urine dipstick NEGATIVE NEGATIVE   Bilirubin Urine NEGATIVE NEGATIVE   Ketones, ur NEGATIVE NEGATIVE mg/dL   Protein, ur NEGATIVE NEGATIVE mg/dL   Nitrite NEGATIVE NEGATIVE   Leukocytes, UA LARGE (A) NEGATIVE   RBC / HPF 0-5 0 - 5 RBC/hpf   WBC, UA 21-50 0 - 5 WBC/hpf   Bacteria, UA MANY (A) NONE SEEN   Squamous Epithelial / LPF 21-50 0 - 5   Mucus PRESENT   Pregnancy, urine POC     Status: Abnormal   Collection Time: 10/31/17 11:38 AM  Result Value Ref Range   Preg Test, Ur POSITIVE (A) NEGATIVE  CBC     Status: None   Collection Time: 10/31/17 11:55 AM  Result Value Ref Range   WBC 6.7 4.0 - 10.5 K/uL   RBC 4.73 3.87 - 5.11 MIL/uL   Hemoglobin 13.0 12.0 - 15.0 g/dL   HCT 57.8 46.9 - 62.9 %   MCV 85.2 78.0 - 100.0 fL   MCH 27.5 26.0 - 34.0 pg   MCHC 32.3 30.0 - 36.0 g/dL   RDW 52.8 41.3 - 24.4 %   Platelets 188 150 - 400 K/uL   MDM UA, UPT CBC, HCG Patient refused pelvic exam.  Offered patient medication for nausea and patient refused.  After discussing normalcy of mucous discharge in pregnancy with patient, patient left AMA without notifying staff.   Assessment and Plan   1. Positive pregnancy test   2. Vaginal discharge    -Patient left without any further evaluation or follow up plan.  Rolm Bookbinder CNM 10/31/2017, 11:48 AM

## 2017-10-31 NOTE — Discharge Instructions (Signed)

## 2017-10-31 NOTE — MAU Note (Signed)
Pt left, did not tell staff she was leaving.

## 2017-10-31 NOTE — MAU Note (Signed)
Pt reports she is pregnant and this am she had some bloody discharge. No pain at this time but has had some pain in her lower left side off/on

## 2017-11-04 ENCOUNTER — Telehealth: Payer: Self-pay | Admitting: Women's Health

## 2017-11-04 NOTE — Telephone Encounter (Signed)
Patient between 5-12 weeks. Will need to get dating u/s.  Pt made aware.

## 2017-11-04 NOTE — Telephone Encounter (Signed)
Please call pt she went to er last week and is preg but but I need to know when she needs to come in for [place] / pt did not know how far along she was.

## 2017-11-07 ENCOUNTER — Other Ambulatory Visit: Payer: Self-pay | Admitting: Obstetrics and Gynecology

## 2017-11-07 DIAGNOSIS — O3680X0 Pregnancy with inconclusive fetal viability, not applicable or unspecified: Secondary | ICD-10-CM

## 2017-11-10 ENCOUNTER — Other Ambulatory Visit: Payer: Self-pay

## 2017-11-10 ENCOUNTER — Encounter: Payer: Self-pay | Admitting: Obstetrics & Gynecology

## 2017-11-12 ENCOUNTER — Other Ambulatory Visit: Payer: Self-pay | Admitting: Obstetrics and Gynecology

## 2017-11-12 DIAGNOSIS — O3680X0 Pregnancy with inconclusive fetal viability, not applicable or unspecified: Secondary | ICD-10-CM

## 2017-11-13 ENCOUNTER — Ambulatory Visit (INDEPENDENT_AMBULATORY_CARE_PROVIDER_SITE_OTHER): Payer: Medicaid Other

## 2017-11-13 DIAGNOSIS — O208 Other hemorrhage in early pregnancy: Secondary | ICD-10-CM | POA: Diagnosis not present

## 2017-11-13 DIAGNOSIS — O3680X0 Pregnancy with inconclusive fetal viability, not applicable or unspecified: Secondary | ICD-10-CM

## 2017-11-13 DIAGNOSIS — Z3A09 9 weeks gestation of pregnancy: Secondary | ICD-10-CM | POA: Diagnosis not present

## 2017-11-13 NOTE — Progress Notes (Signed)
US 9+4 wks,single IUP w/ys,positive fht 162 bpm,normal ovaries bilat,subchorionic hemorrhage 2.3 x .8 x 2.1 cm,crl 27.4 mm

## 2017-11-17 ENCOUNTER — Telehealth: Payer: Self-pay | Admitting: *Deleted

## 2017-11-17 NOTE — Telephone Encounter (Signed)
Patient states she is experiencing nausea and is loosing weight.  Informed patient that since we have not seen her since 2017, she will need to be seen.  Will get in with [facility] tomorrow since she has some openings. Verbalized understanding.

## 2017-11-18 ENCOUNTER — Ambulatory Visit: Payer: Self-pay | Admitting: Women's Health

## 2017-12-02 ENCOUNTER — Encounter: Payer: Self-pay | Admitting: *Deleted

## 2017-12-02 ENCOUNTER — Encounter: Payer: Medicaid Other | Admitting: Women's Health

## 2017-12-23 ENCOUNTER — Ambulatory Visit (INDEPENDENT_AMBULATORY_CARE_PROVIDER_SITE_OTHER): Payer: Medicaid Other | Admitting: Advanced Practice Midwife

## 2017-12-23 ENCOUNTER — Other Ambulatory Visit (INDEPENDENT_AMBULATORY_CARE_PROVIDER_SITE_OTHER): Payer: Medicaid Other

## 2017-12-23 ENCOUNTER — Encounter: Payer: Self-pay | Admitting: Advanced Practice Midwife

## 2017-12-23 VITALS — BP 104/61 | HR 84 | Ht 67.0 in | Wt 204.0 lb

## 2017-12-23 DIAGNOSIS — Z3A15 15 weeks gestation of pregnancy: Secondary | ICD-10-CM

## 2017-12-23 DIAGNOSIS — O4692 Antepartum hemorrhage, unspecified, second trimester: Secondary | ICD-10-CM

## 2017-12-23 DIAGNOSIS — Z331 Pregnant state, incidental: Secondary | ICD-10-CM

## 2017-12-23 DIAGNOSIS — Z1389 Encounter for screening for other disorder: Secondary | ICD-10-CM | POA: Diagnosis not present

## 2017-12-23 LAB — POCT URINALYSIS DIPSTICK
Glucose, UA: NEGATIVE
KETONES UA: NEGATIVE
Protein, UA: NEGATIVE

## 2017-12-23 MED ORDER — SULFAMETHOXAZOLE-TRIMETHOPRIM 800-160 MG PO TABS: 1.0000 | ORAL_TABLET | Freq: Two times a day (BID) | ORAL | 0 refills | Status: DC

## 2017-12-23 NOTE — Progress Notes (Signed)
WALK In for Vaginal bleeding:  Says noticed it bleeding through her clothes, changed clothes in [place] (had an extra pair of clothes in car) but hasn't noticed any since then.  Has some suprapubic pain, no cramping.   SSE:  No blood whatsoever in vagina,  cx LTC, firm.  Normal appearing DC.  Tenderness in vagina w/exam, unsure of where exactly it is sore.  Tenderness w/suprapubic pressure duirng US.  US 15+2 wks,breech,anterior placenta gr 0,norma ovaries bilat,fhr 148 bpm,cx 3.6 cm,svp of fluid 3.5 cm,efw 116 g,anterior    Has not bee seen for new OB appt (no showed twice).  Will schedule for that .  \ Vitals:   12/23/17 1409  BP: 104/61  Pulse: 84   + nitrites  UTI, rx septra

## 2017-12-23 NOTE — Progress Notes (Signed)
US 15+2 wks,breech,anterior placenta gr 0,norma ovaries bilat,fhr 148 bpm,cx 3.6 cm,svp of fluid 3.5 cm,efw 116 g,anterior uterine contraction resolved w/time,look at 2nd set of pictures

## 2017-12-25 ENCOUNTER — Other Ambulatory Visit: Payer: Self-pay | Admitting: Advanced Practice Midwife

## 2017-12-25 ENCOUNTER — Telehealth: Payer: Self-pay | Admitting: *Deleted

## 2017-12-25 MED ORDER — CEPHALEXIN 500 MG PO CAPS: 500.0000 mg | ORAL_CAPSULE | Freq: Three times a day (TID) | ORAL | 0 refills | Status: DC

## 2017-12-25 NOTE — Progress Notes (Unsigned)
fl

## 2017-12-25 NOTE — Telephone Encounter (Signed)
Keflex sent to pharmacy. (And she probably did read not to take in pg only because Septra is contraindicated in 3rd trimester near delivery time d/t risk of neonatal jaundice)

## 2017-12-25 NOTE — Telephone Encounter (Signed)
Patient states she started taking the Septra for an UTI but has started vomiting about an hour after taking. Says she has read not to take during pregnancy.  Informed it was fine during pregnancy but is worried she could be having a reaction to it.  Would like a different antibiotic if possible.  Please advise.

## 2017-12-26 NOTE — Telephone Encounter (Signed)
Unable to leave message regarding Keflex prescription.

## 2017-12-26 NOTE — Telephone Encounter (Signed)
Unable to leave VM

## 2017-12-31 ENCOUNTER — Encounter (HOSPITAL_COMMUNITY): Payer: Self-pay | Admitting: *Deleted

## 2017-12-31 ENCOUNTER — Inpatient Hospital Stay (HOSPITAL_COMMUNITY)
Admission: AD | Admit: 2017-12-31 | Discharge: 2018-01-01 | Disposition: A | Discharge status: Home / Self Care | Source: Ambulatory Visit | Attending: Obstetrics & Gynecology | Admitting: Obstetrics & Gynecology

## 2017-12-31 DIAGNOSIS — F1123 Opioid dependence with withdrawal: Secondary | ICD-10-CM | POA: Insufficient documentation

## 2017-12-31 DIAGNOSIS — Z3A16 16 weeks gestation of pregnancy: Secondary | ICD-10-CM | POA: Insufficient documentation

## 2017-12-31 DIAGNOSIS — R109 Unspecified abdominal pain: Secondary | ICD-10-CM | POA: Diagnosis present

## 2017-12-31 DIAGNOSIS — Z79899 Other long term (current) drug therapy: Secondary | ICD-10-CM | POA: Insufficient documentation

## 2017-12-31 DIAGNOSIS — O99322 Drug use complicating pregnancy, second trimester: Secondary | ICD-10-CM | POA: Insufficient documentation

## 2017-12-31 DIAGNOSIS — Z9049 Acquired absence of other specified parts of digestive tract: Secondary | ICD-10-CM | POA: Diagnosis not present

## 2017-12-31 DIAGNOSIS — F1193 Opioid use, unspecified with withdrawal: Secondary | ICD-10-CM

## 2017-12-31 DIAGNOSIS — O99332 Smoking (tobacco) complicating pregnancy, second trimester: Secondary | ICD-10-CM | POA: Diagnosis not present

## 2017-12-31 DIAGNOSIS — F17210 Nicotine dependence, cigarettes, uncomplicated: Secondary | ICD-10-CM | POA: Diagnosis not present

## 2017-12-31 LAB — URINALYSIS, ROUTINE W REFLEX MICROSCOPIC
BILIRUBIN URINE: NEGATIVE
Glucose, UA: NEGATIVE mg/dL
HGB URINE DIPSTICK: NEGATIVE
Ketones, ur: 20 mg/dL — AB
NITRITE: NEGATIVE
PROTEIN: 30 mg/dL — AB
SPECIFIC GRAVITY, URINE: 1.025 (ref 1.005–1.030)
pH: 6 (ref 5.0–8.0)

## 2017-12-31 MED ORDER — PROMETHAZINE HCL 25 MG/ML IJ SOLN
25.0000 mg | Freq: Once | INTRAMUSCULAR | Status: AC
  Administered 2017-12-31: 25 mg via INTRAVENOUS
  Filled 2017-12-31: qty 1

## 2017-12-31 MED ORDER — DIPHENHYDRAMINE HCL 50 MG/ML IJ SOLN
25.0000 mg | Freq: Once | INTRAMUSCULAR | Status: AC
  Administered 2017-12-31: 25 mg via INTRAVENOUS
  Filled 2017-12-31: qty 1

## 2017-12-31 MED ORDER — DICYCLOMINE HCL 10 MG/ML IM SOLN
20.0000 mg | Freq: Once | INTRAMUSCULAR | Status: AC
  Administered 2017-12-31: 20 mg via INTRAMUSCULAR
  Filled 2017-12-31: qty 2

## 2017-12-31 MED ORDER — DEXAMETHASONE SODIUM PHOSPHATE 10 MG/ML IJ SOLN
10.0000 mg | Freq: Once | INTRAMUSCULAR | Status: AC
  Administered 2017-12-31: 10 mg via INTRAVENOUS
  Filled 2017-12-31: qty 1

## 2017-12-31 MED ORDER — LACTATED RINGERS IV BOLUS
1000.0000 mL | Freq: Once | INTRAVENOUS | Status: AC
  Administered 2017-12-31: 1000 mL via INTRAVENOUS

## 2017-12-31 NOTE — MAU Note (Addendum)
[facility] and states she is 16-[redacted] weeks pregnant and that she is "detoxing". Officers from jail just want to "make sure she is okay." Pt states she has some lower abdominal pain that started yesterday and comes and goes. Denies pain at this time. Pt also reports a lot of nausea and vomiting throughout the pregnancy States she has vomited all day today and has lost a lot of weight. Pt does not take any medication for nausea. Pt denies bleeding or discahrge

## 2017-12-31 NOTE — MAU Provider Note (Signed)
History     CSN: [id number]  Arrival date and time: 12/31/17 7829   First Provider Initiated Contact with Patient 12/31/17 2135      Chief Complaint  Patient presents with  . Abdominal Pain  . Emesis  . Nausea   Brittney Nicholson is a 27 y.o. G3P2002 at [redacted]w[redacted]d who presents today with nausea, vomiting, diarrhea since this morning. 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 She is going to [facility] for prenatal care, but it currently incarcerated.   Emesis   This is a new problem. The current episode started today. Episode frequency: approx 30 times since 5:00am  The problem has been unchanged. The emesis has an appearance of stomach contents. There has been no fever. Associated symptoms include chills, diarrhea, headaches and myalgias. 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  She has tried nothing for the symptoms.    OB History    Gravida  3   Para  2   Term  2   Preterm  0   AB  0   Living  2     SAB  0   TAB  0   Ectopic  0   Multiple  0   Live Births  2           Past Medical History:  Diagnosis Date  . 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 HSV-2 infection complicating pregnancy   . Pregnant 12/13/2014  . Warts     Past Surgical History:  Procedure Laterality Date  . BREAST LUMPECTOMY  2006  . CHOLECYSTECTOMY N/A 01/25/2013   Procedure: LAPAROSCOPIC CHOLECYSTECTOMY;  Surgeon: Dalia Heading, MD;  Location: [facility];  Service: General;  Laterality: N/A;  . right arm Right    has pins.  . TONSILLECTOMY    . TONSILLECTOMY  2011    Family History  Problem Relation Age of Onset  . Cancer Mother        uterine  . Diabetes Father   . Kidney failure Father     Social History   Tobacco Use  . Smoking status: Current Every Day Smoker    Types: Cigarettes  . Smokeless tobacco: Never Used  Substance Use Topics  . Alcohol use: No  . Drug use: Yes    Types: Fentanyl, Heroin    Comment: last use of heroin Monday 29 Dec 2017    Allergies:  Allergies  Allergen Reactions  . Gluten Other (See Comments)    RECTAL BLEEDING  . Wheat Other (See Comments)    Rectal bleeding    Medications Prior to Admission  Medication Sig Dispense Refill Last Dose  . acetaminophen (TYLENOL) 325 MG tablet Take 325 mg by mouth every 6 (six) hours as needed for mild pain.   Not Taking  . cephALEXin (KEFLEX) 500 MG capsule Take 1 capsule (500 mg total) by mouth 3 (three) times daily. 21 capsule 0   . loperamide (IMODIUM) 2 MG capsule Take 2 mg by mouth as needed for diarrhea or loose stools.   Not Taking  . Prenatal Vit-Fe Fumarate-FA (PRENATAL VITAMIN PO) Take 1 tablet by mouth daily.    Taking    Review of Systems  Constitutional: Positive for chills.  Gastrointestinal: Positive for diarrhea and vomiting.  Musculoskeletal: Positive for myalgias.  Neurological: Positive for headaches.   Physical Exam   Blood pressure 108/63, pulse 81, resp. rate 18, height 5\' 7"  (1.702 m), weight 199 lb (90.3 kg), SpO2  99 %, currently breastfeeding.  Physical Exam  Nursing note and vitals reviewed. Constitutional: She is oriented to person, place, and time. She appears well-developed and well-nourished. No distress.  HENT:  Head: Normocephalic.  Cardiovascular: Normal rate.  Respiratory: Effort normal.  GI: Soft. There is no tenderness. There is no rebound.  Genitourinary:  Genitourinary Comments: FHT 154 with doppler   Neurological: She is alert and oriented to person, place, and time.  Skin: Skin is warm and dry.  Psychiatric: She has a normal mood and affect.    MAU Course  Procedures  MDM Patient given migraine cocktail with decadron, benadryl and phenergan. She has had 2L of IV fluid and Bentyl IM. She has not vomited or had diarrhea while here. She is requesting methadone to manage her detox. Patient advised that we cannot provide this here for her.   Assessment and Plan   1. Opiate withdrawal (HCC)   2. [redacted] weeks  gestation of pregnancy    DC home Comfort measures reviewed  2nd Trimester precautions  Bleeding precautions PTL precautions  Fetal kick counts RX: none  Return to MAU as needed FU with OB as planned  Follow-up Information    [facility] Follow up.   Specialty:  Obstetrics and Gynecology Contact information: [address] [place] [id number] [phone]           Thressa ShellerHeather Hogan 12/31/2017, 9:39 PM

## 2017-12-31 NOTE — MAU Note (Addendum)
Pt states she was using percocet once weekly all fall til January 2019.  She then started using fentanyl hourly til 2 weeks ago then switched to heroin 1-2 gram daily. Last use of heroin was Monday night. Has had diarrhea vomiting all day today .  Feels fetal movement. Denies any vag bleeding or discharge nor dysuria.

## 2018-01-01 DIAGNOSIS — O99322 Drug use complicating pregnancy, second trimester: Secondary | ICD-10-CM

## 2018-01-01 DIAGNOSIS — Z3A16 16 weeks gestation of pregnancy: Secondary | ICD-10-CM

## 2018-01-01 DIAGNOSIS — F1123 Opioid dependence with withdrawal: Secondary | ICD-10-CM

## 2018-01-01 NOTE — Discharge Instructions (Signed)
Opioid Withdrawal Opioids are powerful substances that relieve pain. Opioids include illegal drugs, such as heroin, as well as prescription pain medicines, such as codeine, morphine, hydrocodone, oxycodone, and fentanyl. Opioid withdrawal is a group of symptoms that can happen if you have been taking opioids for a long time and suddenly stop. What are the causes? This condition is caused by taking opioids for weeks and then doing any of the following:  Stopping use.  Rapidly reducing use.  Taking a medicine to block their effect.  What increases the risk? This condition is more likely to develop in:  People who take opioids incorrectly.  People who take opioids for a long period of time.  What are the signs or symptoms? Symptoms of this condition can be physical or mental. Physical symptoms include:  Nausea and vomiting.  Muscle aches or spasms.  Watery eyes and runny nose.  Widening of the dark centers of the eyes (dilated pupils).  Hair standing on end.  Fever and sweating.  Intestinal cramping and diarrhea.  Increased blood pressure and fast pulse.  Mental symptoms include:  Depression.  Anxiety.  Restlessness and irritability.  Trouble sleeping.  When symptoms start and how long they last depends on if you have been taking an opioid that works fast and then loses its effect quickly (short acting-opioid), an opioid that works for a longer period of time (long-acting opioid), or a drug that blocks the effects of opioids.  If you have been taking a short-acting opioid, such as heroin and oxycodone, symptoms occur within hours of stopping or reducing the amount you take. The worst symptoms (peak withdrawal) occur in 24-48 hours. Symptoms should subside in 3-5 days.  If you have been taking a long-acting opioid, such as methadone, symptoms can occur within 30 hours of stopping or reducing the amount you take and can continue for up to 10 days.  If you are taking a  drug that blocks the effects of opioids, such as naltrexone or naloxone, symptoms begin within minutes.  How is this diagnosed? This condition is diagnosed based on:  Your symptoms.  Your medical history.  Your history of drug and alcohol use.  Which medicines you have been taking.  Your health care provider may:  Perform a physical exam.  Order tests.  Ask that you see a mental health professional.  How is this treated? Treatment for this condition is usually provided by mental health professionals with training in substance use disorders (addiction specialists). Treatment may involve:  Counseling. This treatment is also called talk therapy. It is provided by substance use treatment counselors.  Support groups. Support groups are run by people who have quit using opioids. They provide emotional support, advice, and guidance.  Medicine. Some medicines can help to lessen certain withdrawal symptoms. Sometimes an opioid is prescribed to replace the opioid that you have been taking. You may be asked to take less and less of this opioid over time to lessen or prevent withdrawal symptoms.  Follow these instructions at home:  Take over-the-counter and prescription medicines only as told by your health care provider.  Check with your health care provider before starting any new medicines.  Keep all follow-up visits as told by your health care provider. This is important. Contact a health care provider if:  You are not able to take your medicines as told.  Your symptoms get worse.  You take an opioid after stopping use, or you take more of an opioid than you have   been. Get help right away if:  You have a seizure.  You lose consciousness.  You have serious thoughts about hurting yourself or others. If you ever feel like you may hurt yourself or others, or have thoughts about taking your own life, get help right away. You can go to your nearest emergency department or  call:  Your local emergency services (911 in the [place]  A suicide crisis helpline, such as the National Suicide Prevention Lifeline at 1-800-273-8255. This is open 24 hours a day.  This information is not intended to replace advice given to you by your health care provider. Make sure you discuss any questions you have with your health care provider. Document Released: 05/30/2003 Document Revised: 03/08/2016 Document Reviewed: 03/08/2016 Elsevier Interactive Patient Education  2018 [employer]  

## 2018-01-09 ENCOUNTER — Encounter: Payer: Medicaid Other | Admitting: Women's Health

## 2018-01-14 ENCOUNTER — Encounter: Payer: Medicaid Other | Admitting: Women's Health

## 2018-01-19 ENCOUNTER — Encounter: Admitting: Family Medicine

## 2018-02-18 ENCOUNTER — Other Ambulatory Visit: Payer: Self-pay

## 2018-02-18 ENCOUNTER — Inpatient Hospital Stay (HOSPITAL_COMMUNITY)
Admission: AD | Admit: 2018-02-18 | Discharge: 2018-02-18 | Disposition: A | Discharge status: Home / Self Care | Payer: Medicaid Other | Source: Ambulatory Visit | Attending: Obstetrics & Gynecology | Admitting: Obstetrics & Gynecology

## 2018-02-18 ENCOUNTER — Encounter (HOSPITAL_COMMUNITY): Payer: Self-pay

## 2018-02-18 DIAGNOSIS — Z833 Family history of diabetes mellitus: Secondary | ICD-10-CM | POA: Insufficient documentation

## 2018-02-18 DIAGNOSIS — Z9889 Other specified postprocedural states: Secondary | ICD-10-CM | POA: Diagnosis not present

## 2018-02-18 DIAGNOSIS — R55 Syncope and collapse: Secondary | ICD-10-CM | POA: Insufficient documentation

## 2018-02-18 DIAGNOSIS — O99332 Smoking (tobacco) complicating pregnancy, second trimester: Secondary | ICD-10-CM | POA: Diagnosis not present

## 2018-02-18 DIAGNOSIS — Z3A23 23 weeks gestation of pregnancy: Secondary | ICD-10-CM | POA: Insufficient documentation

## 2018-02-18 DIAGNOSIS — Z79899 Other long term (current) drug therapy: Secondary | ICD-10-CM | POA: Diagnosis not present

## 2018-02-18 DIAGNOSIS — Z841 Family history of disorders of kidney and ureter: Secondary | ICD-10-CM | POA: Diagnosis not present

## 2018-02-18 DIAGNOSIS — F1991 Other psychoactive substance use, unspecified, in remission: Secondary | ICD-10-CM

## 2018-02-18 DIAGNOSIS — O26892 Other specified pregnancy related conditions, second trimester: Secondary | ICD-10-CM | POA: Diagnosis present

## 2018-02-18 DIAGNOSIS — R51 Headache: Secondary | ICD-10-CM | POA: Insufficient documentation

## 2018-02-18 DIAGNOSIS — F1921 Other psychoactive substance dependence, in remission: Secondary | ICD-10-CM | POA: Insufficient documentation

## 2018-02-18 DIAGNOSIS — Z91018 Allergy to other foods: Secondary | ICD-10-CM | POA: Diagnosis not present

## 2018-02-18 DIAGNOSIS — Z808 Family history of malignant neoplasm of other organs or systems: Secondary | ICD-10-CM | POA: Diagnosis not present

## 2018-02-18 DIAGNOSIS — Z9049 Acquired absence of other specified parts of digestive tract: Secondary | ICD-10-CM | POA: Diagnosis not present

## 2018-02-18 DIAGNOSIS — F17210 Nicotine dependence, cigarettes, uncomplicated: Secondary | ICD-10-CM | POA: Insufficient documentation

## 2018-02-18 DIAGNOSIS — R519 Headache, unspecified: Secondary | ICD-10-CM

## 2018-02-18 DIAGNOSIS — Z87898 Personal history of other specified conditions: Secondary | ICD-10-CM

## 2018-02-18 HISTORY — DX: Anxiety disorder, unspecified: F41.9

## 2018-02-18 HISTORY — DX: Major depressive disorder, single episode, unspecified: F32.9

## 2018-02-18 HISTORY — DX: Headache: R51

## 2018-02-18 HISTORY — DX: Headache, unspecified: R51.9

## 2018-02-18 HISTORY — DX: Depression, unspecified: F32.A

## 2018-02-18 HISTORY — DX: Pneumonia, unspecified organism: J18.9

## 2018-02-18 LAB — CBC
HCT: 34.3 % — ABNORMAL LOW (ref 36.0–46.0)
HEMOGLOBIN: 11.5 g/dL — AB (ref 12.0–15.0)
MCH: 28.8 pg (ref 26.0–34.0)
MCHC: 33.5 g/dL (ref 30.0–36.0)
MCV: 85.8 fL (ref 78.0–100.0)
Platelets: 151 10*3/uL (ref 150–400)
RBC: 4 MIL/uL (ref 3.87–5.11)
RDW: 14 % (ref 11.5–15.5)
WBC: 11.9 10*3/uL — AB (ref 4.0–10.5)

## 2018-02-18 LAB — COMPREHENSIVE METABOLIC PANEL
ALK PHOS: 49 U/L (ref 38–126)
ALT: 9 U/L (ref 0–44)
ANION GAP: 11 (ref 5–15)
AST: 17 U/L (ref 15–41)
Albumin: 3.2 g/dL — ABNORMAL LOW (ref 3.5–5.0)
BILIRUBIN TOTAL: 0.4 mg/dL (ref 0.3–1.2)
BUN: 10 mg/dL (ref 6–20)
CO2: 24 mmol/L (ref 22–32)
CREATININE: 0.43 mg/dL — AB (ref 0.44–1.00)
Calcium: 9.1 mg/dL (ref 8.9–10.3)
Chloride: 96 mmol/L — ABNORMAL LOW (ref 98–111)
GFR calc non Af Amer: >60 mL/min (ref >60)
Glucose, Bld: 89 mg/dL (ref 70–99)
Potassium: 4.7 mmol/L (ref 3.5–5.1)
Sodium: 131 mmol/L — ABNORMAL LOW (ref 135–145)
Total Protein: 5.9 g/dL — ABNORMAL LOW (ref 6.5–8.1)

## 2018-02-18 MED ORDER — DEXAMETHASONE SODIUM PHOSPHATE 10 MG/ML IJ SOLN
10.0000 mg | Freq: Once | INTRAMUSCULAR | Status: AC
  Administered 2018-02-18: 10 mg via INTRAVENOUS
  Filled 2018-02-18: qty 1

## 2018-02-18 MED ORDER — METOCLOPRAMIDE HCL 5 MG/ML IJ SOLN
10.0000 mg | Freq: Once | INTRAMUSCULAR | Status: AC
  Administered 2018-02-18: 10 mg via INTRAVENOUS
  Filled 2018-02-18: qty 2

## 2018-02-18 MED ORDER — DIPHENHYDRAMINE HCL 50 MG/ML IJ SOLN
25.0000 mg | Freq: Once | INTRAMUSCULAR | Status: AC
  Administered 2018-02-18: 25 mg via INTRAVENOUS
  Filled 2018-02-18: qty 1

## 2018-02-18 MED ORDER — LACTATED RINGERS IV BOLUS (SEPSIS)
1000.0000 mL | Freq: Once | INTRAVENOUS | Status: AC
  Administered 2018-02-18: 1000 mL via INTRAVENOUS

## 2018-02-18 NOTE — MAU Provider Note (Addendum)
Pt not in lobby but was seen outside smoking

## 2018-02-18 NOTE — MAU Note (Signed)
Sig other states when he arrived home approx 0730, pt was noted to be lying on floor convulsing, unresponsive, blue in color, but had heart rate. He also noted that she was covered in vomit.  States he began chest compressions after calling paramedics.  Paramedics arrived & pt began spontaneous respirations.  Pt refused to come with paramedics.  Pt states she "passes out all the time" but never to this extent (also endorsed by sig other).  Pt states she has cold, but h/a since last night that has not been relieved by tylenol (does not know when she took last).  Denies other complaints.

## 2018-02-18 NOTE — MAU Provider Note (Signed)
Chief Complaint:  Headache   None     HPI: Brittney Nicholson is a 27 y.o. [phone] at [redacted]w[redacted]d who presents to maternity admissions due to a syncopal episode this morning. She reports frequent syncope and headaches.  Last night a severe generalized h/a started, unrelieved by Tylenol.  This morning, she was reportedly found unresponsive by her s/o, with her face blue and vomit on her clothes.  She denies dizziness now, but her h/a is still severe. Her neck and head hurt, generalized without laterality.  It is not associated with photophobia or nausea.  She does not have an explanation for how her s/o found her this morning, and indicates that she passes out often and does not know why. She has never had work-up or seen cardiology. She reported heroin use on 12/31/17 in MAU.  UDS was negative.  She has been incarcerated and has not had care this pregnancy but plans to have care at [facility]   She reports good fetal movement, denies cramping/contractions, LOF, vaginal bleeding, vaginal itching/burning, urinary symptoms, dizziness, n/v, or fever/chills.    HPI  Past Medical History: Past Medical History:  Diagnosis Date  . Anxiety   . Depression   . 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 Headache    with this pregnancy  . HSV-2 infection complicating pregnancy   . Pneumonia   . Pregnant 12/13/2014  . Warts     Past obstetric history: OB History  Gravida Para Term Preterm AB Living  5 2 2  0 2 2  SAB TAB Ectopic Multiple Live Births  2 0 0 0 2    # Outcome Date GA Lbr Len/2nd Weight Sex Delivery Anes PTL Lv  5 Current           4 Term 07/28/15 [redacted]w[redacted]d 10:20 / 00:04 3549 g F Vag-Spont EPI  LIV  3 Term 11/08/12 [redacted]w[redacted]d 01:36 / 14:15 4034 g F Vag-Spont EPI  LIV     Birth Comments: WNL  2 SAB           1 SAB             Past Surgical History: Past Surgical History:  Procedure Laterality Date  . BREAST LUMPECTOMY  2006  . CHOLECYSTECTOMY N/A 01/25/2013   Procedure: LAPAROSCOPIC CHOLECYSTECTOMY;  Surgeon:  Dalia Heading, MD;  Location: [facility];  Service: General;  Laterality: N/A;  . right arm Right    has pins.  . TONSILLECTOMY    . TONSILLECTOMY  2011    Family History: Family History  Problem Relation Age of Onset  . Cancer Mother        uterine  . Diabetes Father   . Kidney failure Father     Social History: Social History   Tobacco Use  . Smoking status: Current Every Day Smoker    Packs/day: 1.00    Types: Cigarettes  . Smokeless tobacco: Never Used  Substance Use Topics  . Alcohol use: No  . Drug use: Yes    Types: Fentanyl, Heroin    Comment: last use of heroin Monday 29 Dec 2017    Allergies:  Allergies  Allergen Reactions  . Gluten Other (See Comments)    RECTAL BLEEDING  . Wheat Other (See Comments)    Rectal bleeding    Meds:  Medications Prior to Admission  Medication Sig Dispense Refill Last Dose  . acetaminophen (TYLENOL) 325 MG tablet Take 650 mg by mouth every 6 (six) hours as needed for mild  pain.    02/17/2018 at Unknown time  . Prenatal Vit-Fe Fumarate-FA (PRENATAL VITAMIN PO) Take 1 tablet by mouth daily.    02/17/2018 at Unknown time  . loperamide (IMODIUM) 2 MG capsule Take 2 mg by mouth as needed for diarrhea or loose stools.   Not Taking at Unknown time    ROS:  Review of Systems  Constitutional: Negative for chills, fatigue and fever.  Eyes: Negative for visual disturbance.  Respiratory: Negative for shortness of breath.   Cardiovascular: Negative for chest pain.  Gastrointestinal: Negative for abdominal pain, nausea and vomiting.  Genitourinary: Negative for difficulty urinating, dysuria, flank pain, pelvic pain, vaginal bleeding, vaginal discharge and vaginal pain.  Neurological: Negative for dizziness and headaches.  Psychiatric/Behavioral: Negative.      I have reviewed patient's Past Medical Hx, Surgical Hx, Family Hx, Social Hx, medications and allergies.   Physical Exam   Patient Vitals for the past 24 hrs:  BP Temp Temp  src Pulse Resp SpO2 Weight  02/18/18 1235 (!) 97/52 - - 81 16 - -  02/18/18 0928 (!) 95/53 98.3 F (36.8 C) Oral 94 16 99 % 96.2 kg  02/18/18 0926 - - - - - 99 % -   Constitutional: Well-developed, well-nourished female in moderate distress.  HEART: normal rate, heart sounds, regular rhythm RESP: normal effort, lung sounds clear and equal bilaterally GI: Abd soft, non-tender, gravid appropriate for gestational age.  MS: Extremities nontender, no edema, normal ROM Neurological - alert, oriented, normal speech, no focal findings or movement disorder noted, neck supple without rigidity, cranial nerves II through XII intact, DTR's normal and symmetric, motor and sensory grossly normal bilaterally, normal muscle tone, no tremors, strength 5/5 GU: Neg CVAT.  PELVIC EXAM: Deferred     FHT:  Baseline 145 , moderate variability, accelerations present, no decelerations Contractions:None on toco or to palpation   Labs: Results for orders placed or performed during the hospital encounter of 02/18/18 (from the past 24 hour(s))  CBC     Status: Abnormal   Collection Time: 02/18/18 10:57 AM  Result Value Ref Range   WBC 11.9 (H) 4.0 - 10.5 K/uL   RBC 4.00 3.87 - 5.11 MIL/uL   Hemoglobin 11.5 (L) 12.0 - 15.0 g/dL   HCT 16.1 (L) 09.6 - 04.5 %   MCV 85.8 78.0 - 100.0 fL   MCH 28.8 26.0 - 34.0 pg   MCHC 33.5 30.0 - 36.0 g/dL   RDW 40.9 81.1 - 91.4 %   Platelets 151 150 - 400 K/uL  Comprehensive metabolic panel     Status: Abnormal   Collection Time: 02/18/18 10:57 AM  Result Value Ref Range   Sodium 131 (L) 135 - 145 mmol/L   Potassium 4.7 3.5 - 5.1 mmol/L   Chloride 96 (L) 98 - 111 mmol/L   CO2 24 22 - 32 mmol/L   Glucose, Bld 89 70 - 99 mg/dL   BUN 10 6 - 20 mg/dL   Creatinine, Ser 7.82 (L) 0.44 - 1.00 mg/dL   Calcium 9.1 8.9 - 95.6 mg/dL   Total Protein 5.9 (L) 6.5 - 8.1 g/dL   Albumin 3.2 (L) 3.5 - 5.0 g/dL   AST 17 15 - 41 U/L   ALT 9 0 - 44 U/L   Alkaline Phosphatase 49 38 - 126  U/L   Total Bilirubin 0.4 0.3 - 1.2 mg/dL   GFR calc non Af Amer >60 >60 mL/min   GFR calc Af Amer >60 >60 mL/min  Anion gap 11 5 - 15      Imaging:  No results found.  MAU Course/MDM: Pt presented in stable condition by family car after refusing EMS transport called by her s/o when he found her unresponsive today She has known hx of heroin abuse but denies any recent use of illegal substances or substances not prescribed for her She has normal neurological exam today and normal EKG and labwork so no acute findings, no explanation for syncopal episodes CPS officer arrived to hospital while pt in MAU and asked for medical information about pt including drug testing Pt declined to give urine sample and declined cathed urine today S/O went out to lobby and talked with CPS officer but no medical information was shared by staff, CPS officer to request medical records after pt visit today Pt stable, headache resolved with headache cocktail (Benadryl, Reglan, Decadron IV and IV fluids) NST reviewed and reactive, 10x10 accels appropriate for gestational age Pt discharge with strict return precautions.  Pt reported receiving care at [facility] but no records for this pregnancy. She was incarcerated for some of this pregnancy. Pt to start care as soon as possible.   Assessment: 1. Headache in pregnancy, antepartum, second trimester   2. History of illicit drug use   3. Syncope and collapse   4. Pregnancy with 23 completed weeks gestation     Plan: Discharge home Labor precautions and fetal kick counts Follow-up Information    [facility] Follow up.   Why:  Start prenatal care in this pregnancy with [facility]. Return to MAU for emergencies. Contact information: [address] [place] [address] [phone]         Allergies as of 02/18/2018      Reactions   Gluten Other (See Comments)   RECTAL BLEEDING   Wheat Other (See Comments)   Rectal  bleeding      Medication List    TAKE these medications   acetaminophen 325 MG tablet Commonly known as:  TYLENOL Take 650 mg by mouth every 6 (six) hours as needed for mild pain.   loperamide 2 MG capsule Commonly known as:  IMODIUM Take 2 mg by mouth as needed for diarrhea or loose stools.   PRENATAL VITAMIN PO Take 1 tablet by mouth daily.       Sharen Counter Certified Nurse-Midwife 02/18/2018 12:58 PM

## 2018-02-18 NOTE — MAU Note (Addendum)
Gait slow but steady, no apparent or obvious distress.  Pt was seen outside smoking earlier when first called. Strong smell of smoke to pt. Unable to urinate at this time

## 2018-02-18 NOTE — MAU Note (Signed)
Reports had diarrhea during the night, once this morning.

## 2018-02-18 NOTE — MAU Note (Signed)
Has a headache, started last night, took some Tylenol.  Couldn't sleep,was throwing up all night. Has been sick for 2 or 3 days..  FOB states when he got home at 0730- she was sitting on the floor slumped over, covered in vomit, not conscience, not responding; he called 911, laid her on her back, did chest compressions.  She started breathing prior to EMS arrival and regained consciousness when they arrived.  They took vs.  She refused transport.

## 2018-02-18 NOTE — MAU Note (Addendum)
EKG performed - handed to provider Sharen Counter, CNM)  [id number] - Urine sample not given - pt stated she "was unable to give sample and wanted to get a bag of IV fluids before giving sample"

## 2018-02-18 NOTE — Progress Notes (Signed)
Pt states "I don't want to give a urine sample".  When asked why, she stated, "I just don't want to".  CNM notified & aware.

## 2018-03-03 ENCOUNTER — Encounter: Admitting: Nurse Practitioner

## 2018-03-12 ENCOUNTER — Ambulatory Visit (INDEPENDENT_AMBULATORY_CARE_PROVIDER_SITE_OTHER): Payer: Medicaid Other | Admitting: Women's Health

## 2018-03-12 ENCOUNTER — Encounter: Payer: Self-pay | Admitting: Women's Health

## 2018-03-12 ENCOUNTER — Ambulatory Visit: Payer: Medicaid Other | Admitting: *Deleted

## 2018-03-12 ENCOUNTER — Other Ambulatory Visit (HOSPITAL_COMMUNITY)
Admission: RE | Admit: 2018-03-12 | Discharge: 2018-03-12 | Disposition: A | Discharge status: Home / Self Care | Payer: Medicaid Other | Source: Ambulatory Visit | Attending: Obstetrics & Gynecology | Admitting: Obstetrics & Gynecology

## 2018-03-12 VITALS — BP 115/63 | HR 63 | Wt 224.0 lb

## 2018-03-12 DIAGNOSIS — O2342 Unspecified infection of urinary tract in pregnancy, second trimester: Secondary | ICD-10-CM | POA: Insufficient documentation

## 2018-03-12 DIAGNOSIS — Z331 Pregnant state, incidental: Secondary | ICD-10-CM

## 2018-03-12 DIAGNOSIS — Z3482 Encounter for supervision of other normal pregnancy, second trimester: Secondary | ICD-10-CM

## 2018-03-12 DIAGNOSIS — O99342 Other mental disorders complicating pregnancy, second trimester: Secondary | ICD-10-CM

## 2018-03-12 DIAGNOSIS — F1190 Opioid use, unspecified, uncomplicated: Secondary | ICD-10-CM

## 2018-03-12 DIAGNOSIS — Z1389 Encounter for screening for other disorder: Secondary | ICD-10-CM

## 2018-03-12 DIAGNOSIS — Z3A26 26 weeks gestation of pregnancy: Secondary | ICD-10-CM

## 2018-03-12 DIAGNOSIS — O99322 Drug use complicating pregnancy, second trimester: Secondary | ICD-10-CM

## 2018-03-12 DIAGNOSIS — F418 Other specified anxiety disorders: Secondary | ICD-10-CM

## 2018-03-12 DIAGNOSIS — Z3490 Encounter for supervision of normal pregnancy, unspecified, unspecified trimester: Secondary | ICD-10-CM | POA: Insufficient documentation

## 2018-03-12 DIAGNOSIS — Z363 Encounter for antenatal screening for malformations: Secondary | ICD-10-CM

## 2018-03-12 LAB — POCT URINALYSIS DIPSTICK OB
GLUCOSE, UA: NEGATIVE
Ketones, UA: NEGATIVE
LEUKOCYTES UA: NEGATIVE
Nitrite, UA: NEGATIVE
RBC UA: NEGATIVE

## 2018-03-12 NOTE — Progress Notes (Signed)
INITIAL OBSTETRICAL VISIT Patient name: Brittney Nicholson MRN [id number]  Date of birth: November 24, 1990 Chief Complaint:   Initial Prenatal Visit (hard time voiding, hemorrhoids, left eye sty)  History of Present Illness:   Brittney Nicholson is a 27 y.o. [id number] female at [redacted]w[redacted]d by 9wk u/s, with an Estimated Date of Delivery: 06/14/18 being seen today for her initial obstetrical visit.   Her obstetrical history is significant for SAB x 2, term uncomplicated SVB x 2.   Today she reports dep/anx, taking percocet 10mg  1-2x/day off street/no rx to try to come off of heroin. No heroin in a few weeks. Sees [facility] tomorrow to discuss all. Recent incarceration, just got out about 2wks ago, wearing ankle locator- has night curfew only. States she was told in jail she was measuring small and was at r/f PTB.  Hard time voiding last few days, feels urge but nothing comes out. 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  No LMP recorded (lmp unknown). Patient is pregnant. Last pap 02/15/15. Results were: normal Review of Systems:   Pertinent items are noted in HPI Denies cramping/contractions, leakage of fluid, vaginal bleeding, abnormal vaginal discharge w/ itching/odor/irritation, headaches, visual changes, shortness of breath, chest pain, abdominal pain, severe nausea/vomiting, or problems with urination or bowel movements unless otherwise stated above.  Pertinent History Reviewed:  Reviewed past medical,surgical, social, obstetrical and family history.  Reviewed problem list, medications and allergies. OB History  Gravida Para Term Preterm AB Living  5 2 2  0 2 2  SAB TAB Ectopic Multiple Live Births  2 0 0 0 2    # Outcome Date GA Lbr Len/2nd Weight Sex Delivery Anes PTL Lv  5 Current           4 Term 07/28/15 [redacted]w[redacted]d 10:20 / 00:04 7 lb 13.2 oz (3.549 kg) F Vag-Spont EPI N LIV  3 Term 11/08/12 [redacted]w[redacted]d 01:36 / 14:15 8 lb 14.3 oz (4.034 kg) F Vag-Spont EPI N LIV     Birth Comments: WNL  2 SAB           1  SAB            Physical Assessment:   Vitals:   03/12/18 1359  BP: 115/63  Pulse: 63  Weight: 224 lb (101.6 kg)  Body mass index is 35.08 kg/m.       Physical Examination:  General appearance - well appearing, and in no distress  Mental status - alert, oriented to person, place, and time  Psych:  She has a normal mood and affect  Skin - warm and dry, normal color, no suspicious lesions noted  Chest - effort normal, all lung fields clear to auscultation bilaterally  Heart - normal rate and regular rhythm  Abdomen - soft, nontender  Extremities:  No swelling or varicosities noted  Pelvic - VULVA: normal appearing vulva with no masses, tenderness or lesions  VAGINA: normal appearing vagina with normal color and discharge, no lesions  CERVIX: normal appearing cervix without discharge or lesions, no CMT  Thin prep pap is done w/ reflex HR HPV cotesting  Fetal Heart Rate (bpm): 140 via doppler FH: 26cm  Results for orders placed or performed in visit on 03/12/18 (from the past 24 hour(s))  POC Urinalysis Dipstick OB   Collection Time: 03/12/18  2:13 PM  Result Value Ref Range   Color, UA     Clarity, UA     Glucose, UA Negative Negative   Bilirubin, UA  Ketones, UA neg    Spec Grav, UA     Blood, UA neg    pH, UA     POC Protein UA Trace Negative, Trace   Urobilinogen, UA     Nitrite, UA neg    Leukocytes, UA Negative Negative   Appearance     Odor      Assessment & Plan:  1) Low-Risk Pregnancy [id number] at [redacted]w[redacted]d with an Estimated Date of Delivery: 06/14/18   2) Initial OB visit  3) Recent heroin use  4) Opiate use-no rx, oxycodone 10mg  1-2x/day, going to [facility] tomorrow to discuss Suboxone  5) Dep/anx- no meds currently, discussing tomorrow at [facility]  6) Stye>warm compresses  Meds: No orders of the defined types were placed in this encounter.   Initial labs obtained Continue prenatal vitamins Reviewed n/v relief measures and warning s/s to report Reviewed  recommended weight gain based on pre-gravid BMI Encouraged well-balanced diet Genetic Screening discussed Quad Screen: too late Cystic fibrosis screening discussed +carrier Ultrasound discussed; fetal survey: requested CCNC completed> spoke w/ Tobi Bastos Wants flu shot next visit  Follow-up: Return for asap for anatomy u/s & pn2 (no visit), then 4wks for LROB.   Orders Placed This Encounter  Procedures  . GC/Chlamydia Probe Amp  . Urine Culture  . US OB Comp + 14 Wk  . Obstetric Panel, Including HIV  . Urinalysis, Routine w reflex microscopic  . Pain Management Screening Profile (10S)  . POC Urinalysis Dipstick OB    Cheral Marker CNM, [facility] 03/12/2018 3:22 PM

## 2018-03-12 NOTE — Patient Instructions (Addendum)
Brittney Nicholson, I greatly value your feedback.  If you receive a survey following your visit with [place] today, we appreciate you taking the time to fill it out.  Thanks, Brittney Nicholson, CNM, WHNP-BC  You will have your sugar test next visit.  Please do not eat or drink anything after midnight the night before you come, not even water.  You will be here for at least two hours.      Call the office [phone]) or go to [facility] if:  You begin to have strong, frequent contractions  Your water breaks.  Sometimes it is a big gush of fluid, sometimes it is just a trickle that keeps getting your panties wet or running down your legs  You have vaginal bleeding.  It is normal to have a small amount of spotting if your cervix was checked.   You don't feel your baby moving like normal.  If you don't, get you something to eat and drink and lay down and focus on feeling your baby move.  You should feel at least 10 movements in 2 hours.  If you don't, you should call the office or go to [facility]    Tdap Vaccine  It is recommended that you get the Tdap vaccine during the third trimester of EACH pregnancy to help protect your baby from getting pertussis (whooping cough)  27-36 weeks is the BEST time to do this so that you can pass the protection on to your baby. During pregnancy is better than after pregnancy, but if you are unable to get it during pregnancy it will be offered at the hospital.   You can get this vaccine with [place], at the health department, your family doctor, or some local pharmacies  Everyone who will be around your baby should also be up-to-date on their vaccines before the baby comes. Adults (who are not pregnant) only need 1 dose of Tdap during adulthood.   Third Trimester of Pregnancy The third trimester is from week 29 through week 42, months 7 through 9. The third trimester is a time when the fetus is growing rapidly. At the end of the ninth month, the fetus is about 20  inches in length and weighs 6-10 pounds.  BODY CHANGES Your body goes through many changes during pregnancy. The changes vary from woman to woman.   Your weight will continue to increase. You can expect to gain 25-35 pounds (11-16 kg) by the end of the pregnancy.  You may begin to get stretch marks on your hips, abdomen, and breasts.  You may urinate more often because the fetus is moving lower into your pelvis and pressing on your bladder.  You may develop or continue to have heartburn as a result of your pregnancy.  You may develop constipation because certain hormones are causing the muscles that push waste through your intestines to slow down.  You may develop hemorrhoids or swollen, bulging veins (varicose veins).  You may have pelvic pain because of the weight gain and pregnancy hormones relaxing your joints between the bones in your pelvis. Backaches may result from overexertion of the muscles supporting your posture.  You may have changes in your hair. These can include thickening of your hair, rapid growth, and changes in texture. Some women also have hair loss during or after pregnancy, or hair that feels dry or thin. Your hair will most likely return to normal after your baby is born.  Your breasts will continue to grow and be  tender. A yellow discharge may leak from your breasts called colostrum.  Your belly button may stick out.  You may feel short of breath because of your expanding uterus.  You may notice the fetus "dropping," or moving lower in your abdomen.  You may have a bloody mucus discharge. This usually occurs a few days to a week before labor begins.  Your cervix becomes thin and soft (effaced) near your due date. WHAT TO EXPECT AT YOUR PRENATAL EXAMS  You will have prenatal exams every 2 weeks until week 36. Then, you will have weekly prenatal exams. During a routine prenatal visit:  You will be weighed to make sure you and the fetus are growing  normally.  Your blood pressure is taken.  Your abdomen will be measured to track your baby's growth.  The fetal heartbeat will be listened to.  Any test results from the previous visit will be discussed.  You may have a cervical check near your due date to see if you have effaced. At around 36 weeks, your caregiver will check your cervix. At the same time, your caregiver will also perform a test on the secretions of the vaginal tissue. This test is to determine if a type of bacteria, Group B streptococcus, is present. Your caregiver will explain this further. Your caregiver may ask you:  What your birth plan is.  How you are feeling.  If you are feeling the baby move.  If you have had any abnormal symptoms, such as leaking fluid, bleeding, severe headaches, or abdominal cramping.  If you have any questions. Other tests or screenings that may be performed during your third trimester include:  Blood tests that check for low iron levels (anemia).  Fetal testing to check the health, activity level, and growth of the fetus. Testing is done if you have certain medical conditions or if there are problems during the pregnancy. FALSE LABOR You may feel small, irregular contractions that eventually go away. These are called Braxton Hicks contractions, or false labor. Contractions may last for hours, days, or even weeks before true labor sets in. If contractions come at regular intervals, intensify, or become painful, it is best to be seen by your caregiver.  SIGNS OF LABOR   Menstrual-like cramps.  Contractions that are 5 minutes apart or less.  Contractions that start on the top of the uterus and spread down to the lower abdomen and back.  A sense of increased pelvic pressure or back pain.  A watery or bloody mucus discharge that comes from the vagina. If you have any of these signs before the 37th week of pregnancy, call your caregiver right away. You need to go to the hospital to  get checked immediately. HOME CARE INSTRUCTIONS   Avoid all smoking, herbs, alcohol, and unprescribed drugs. These chemicals affect the formation and growth of the baby.  Follow your caregiver's instructions regarding medicine use. There are medicines that are either safe or unsafe to take during pregnancy.  Exercise only as directed by your caregiver. Experiencing uterine cramps is a good sign to stop exercising.  Continue to eat regular, healthy meals.  Wear a good support bra for breast tenderness.  Do not use hot tubs, steam rooms, or saunas.  Wear your seat belt at all times when driving.  Avoid raw meat, uncooked cheese, cat litter boxes, and soil used by cats. These carry germs that can cause birth defects in the baby.  Take your prenatal vitamins.  Try taking a  stool softener (if your caregiver approves) if you develop constipation. Eat more high-fiber foods, such as fresh vegetables or fruit and whole grains. Drink plenty of fluids to keep your urine clear or pale yellow.  Take warm sitz baths to soothe any pain or discomfort caused by hemorrhoids. Use hemorrhoid cream if your caregiver approves.  If you develop varicose veins, wear support hose. Elevate your feet for 15 minutes, 3-4 times a day. Limit salt in your diet.  Avoid heavy lifting, wear low heal shoes, and practice good posture.  Rest a lot with your legs elevated if you have leg cramps or low back pain.  Visit your dentist if you have not gone during your pregnancy. Use a soft toothbrush to brush your teeth and be gentle when you floss.  A sexual relationship may be continued unless your caregiver directs you otherwise.  Do not travel far distances unless it is absolutely necessary and only with the approval of your caregiver.  Take prenatal classes to understand, practice, and ask questions about the labor and delivery.  Make a trial run to the hospital.  Pack your hospital bag.  Prepare the baby's  nursery.  Continue to go to all your prenatal visits as directed by your caregiver. SEEK MEDICAL CARE IF:  You are unsure if you are in labor or if your water has broken.  You have dizziness.  You have mild pelvic cramps, pelvic pressure, or nagging pain in your abdominal area.  You have persistent nausea, vomiting, or diarrhea.  You have a bad smelling vaginal discharge.  You have pain with urination. SEEK IMMEDIATE MEDICAL CARE IF:   You have a fever.  You are leaking fluid from your vagina.  You have spotting or bleeding from your vagina.  You have severe abdominal cramping or pain.  You have rapid weight loss or gain.  You have shortness of breath with chest pain.  You notice sudden or extreme swelling of your face, hands, ankles, feet, or legs.  You have not felt your baby move in over an hour.  You have severe headaches that do not go away with medicine.  You have vision changes. Document Released: 05/21/2001 Document Revised: 06/01/2013 Document Reviewed: 07/28/2012 [facility] Patient Information 2015 [employer], [place]. This information is not intended to replace advice given to you by your health care provider. Make sure you discuss any questions you have with your health care provider.

## 2018-03-13 LAB — PMP SCREEN PROFILE (10S), URINE
AMPHETAMINE SCREEN URINE: NEGATIVE ng/mL
BARBITURATE SCREEN URINE: NEGATIVE ng/mL
BENZODIAZEPINE SCREEN, URINE: NEGATIVE ng/mL
CANNABINOIDS UR QL SCN: NEGATIVE ng/mL
COCAINE(METAB.)SCREEN, URINE: NEGATIVE ng/mL
Creatinine(Crt), U: 197.7 mg/dL (ref 20.0–300.0)
Methadone Screen, Urine: NEGATIVE ng/mL
OXYCODONE+OXYMORPHONE UR QL SCN: NEGATIVE ng/mL
Opiate Scrn, Ur: POSITIVE ng/mL — AB
Ph of Urine: 6.6 (ref 4.5–8.9)
Phencyclidine Qn, Ur: NEGATIVE ng/mL
Propoxyphene Scrn, Ur: NEGATIVE ng/mL

## 2018-03-13 LAB — MED LIST OPTION NOT SELECTED

## 2018-03-16 LAB — CYTOLOGY - PAP
Chlamydia: NEGATIVE
Diagnosis: NEGATIVE
Neisseria Gonorrhea: NEGATIVE

## 2018-03-17 ENCOUNTER — Ambulatory Visit (INDEPENDENT_AMBULATORY_CARE_PROVIDER_SITE_OTHER): Payer: Medicaid Other

## 2018-03-17 ENCOUNTER — Other Ambulatory Visit: Payer: Medicaid Other

## 2018-03-17 DIAGNOSIS — Z131 Encounter for screening for diabetes mellitus: Secondary | ICD-10-CM

## 2018-03-17 DIAGNOSIS — Z363 Encounter for antenatal screening for malformations: Secondary | ICD-10-CM | POA: Diagnosis not present

## 2018-03-17 DIAGNOSIS — Z3A27 27 weeks gestation of pregnancy: Secondary | ICD-10-CM

## 2018-03-17 DIAGNOSIS — Z3482 Encounter for supervision of other normal pregnancy, second trimester: Secondary | ICD-10-CM

## 2018-03-17 NOTE — Progress Notes (Signed)
[place] 27+2 wks,cephalic,cx 3.4 cm,anterior placenta gr 2,normal ovaries bilat, afi 14 cm,fhr 144 bpm,efw 992 g 23%,anatomy complete,no obvious abnormalities

## 2018-03-18 LAB — OBSTETRIC PANEL, INCLUDING HIV
Antibody Screen: NEGATIVE
BASOS ABS: 0 10*3/uL (ref 0.0–0.2)
Basos: 0 %
EOS (ABSOLUTE): 0.2 10*3/uL (ref 0.0–0.4)
Eos: 4 %
HEP B S AG: NEGATIVE
HIV Screen 4th Generation wRfx: NONREACTIVE
Hematocrit: 35.7 % (ref 34.0–46.6)
Hemoglobin: 11.4 g/dL (ref 11.1–15.9)
IMMATURE GRANS (ABS): 0 10*3/uL (ref 0.0–0.1)
Immature Granulocytes: 1 %
LYMPHS ABS: 1.5 10*3/uL (ref 0.7–3.1)
LYMPHS: 23 %
MCH: 27.3 pg (ref 26.6–33.0)
MCHC: 31.9 g/dL (ref 31.5–35.7)
MCV: 86 fL (ref 79–97)
MONOCYTES: 7 %
Monocytes Absolute: 0.5 10*3/uL (ref 0.1–0.9)
NEUTROS PCT: 65 %
Neutrophils Absolute: 4.1 10*3/uL (ref 1.4–7.0)
PLATELETS: 171 10*3/uL (ref 150–450)
RBC: 4.17 x10E6/uL (ref 3.77–5.28)
RDW: 14 % (ref 12.3–15.4)
RPR: NONREACTIVE
Rh Factor: POSITIVE
Rubella Antibodies, IGG: <0.9 index — ABNORMAL LOW (ref >0.99)
WBC: 6.4 10*3/uL (ref 3.4–10.8)

## 2018-03-18 LAB — URINALYSIS, ROUTINE W REFLEX MICROSCOPIC

## 2018-03-19 ENCOUNTER — Encounter: Payer: Self-pay | Admitting: Women's Health

## 2018-03-19 DIAGNOSIS — O9989 Other specified diseases and conditions complicating pregnancy, childbirth and the puerperium: Secondary | ICD-10-CM

## 2018-03-19 DIAGNOSIS — Z283 Underimmunization status: Secondary | ICD-10-CM | POA: Insufficient documentation

## 2018-03-19 DIAGNOSIS — O09899 Supervision of other high risk pregnancies, unspecified trimester: Secondary | ICD-10-CM | POA: Insufficient documentation

## 2018-03-24 ENCOUNTER — Other Ambulatory Visit: Payer: Medicaid Other

## 2018-04-09 ENCOUNTER — Encounter: Payer: Self-pay | Admitting: *Deleted

## 2018-04-09 ENCOUNTER — Encounter: Payer: Medicaid Other | Admitting: Obstetrics & Gynecology

## 2018-04-20 ENCOUNTER — Ambulatory Visit (INDEPENDENT_AMBULATORY_CARE_PROVIDER_SITE_OTHER): Payer: Medicaid Other | Admitting: Obstetrics & Gynecology

## 2018-04-20 ENCOUNTER — Encounter: Payer: Self-pay | Admitting: Obstetrics & Gynecology

## 2018-04-20 VITALS — BP 101/51 | HR 84 | Wt 214.0 lb

## 2018-04-20 DIAGNOSIS — Z1389 Encounter for screening for other disorder: Secondary | ICD-10-CM

## 2018-04-20 DIAGNOSIS — Z3A32 32 weeks gestation of pregnancy: Secondary | ICD-10-CM

## 2018-04-20 DIAGNOSIS — Z331 Pregnant state, incidental: Secondary | ICD-10-CM

## 2018-04-20 DIAGNOSIS — Z3483 Encounter for supervision of other normal pregnancy, third trimester: Secondary | ICD-10-CM

## 2018-04-20 DIAGNOSIS — O0993 Supervision of high risk pregnancy, unspecified, third trimester: Secondary | ICD-10-CM

## 2018-04-20 DIAGNOSIS — O99323 Drug use complicating pregnancy, third trimester: Secondary | ICD-10-CM

## 2018-04-20 LAB — POCT URINALYSIS DIPSTICK OB
Blood, UA: NEGATIVE
Glucose, UA: NEGATIVE
Ketones, UA: NEGATIVE
LEUKOCYTES UA: NEGATIVE
NITRITE UA: NEGATIVE

## 2018-04-20 NOTE — Progress Notes (Signed)
   HIGH-RISK PREGNANCY VISIT Patient name: Brittney Nicholson MRN [id number]  Date of birth: [birth year] Chief Complaint:   Routine Prenatal Visit ( lot of pressure)  History of Present Illness:   Brittney Nicholson is a 27 y.o. [phone] female at [redacted]w[redacted]d with an Estimated Date of Delivery: 06/14/18 being seen today for ongoing management of a high-risk pregnancy complicated by drug use.  Today she reports pelvic pressure. Contractions: Irregular.  .  Movement: Present. denies leaking of fluid.  Review of Systems:   Pertinent items are noted in HPI Denies abnormal vaginal discharge w/ itching/odor/irritation, headaches, visual changes, shortness of breath, chest pain, abdominal pain, severe nausea/vomiting, or problems with urination or bowel movements unless otherwise stated above. Pertinent History Reviewed:  Reviewed past medical,surgical, social, obstetrical and family history.  Reviewed problem list, medications and allergies. Physical Assessment:   Vitals:   04/20/18 1150  BP: (!) 101/51  Pulse: 84  Weight: 214 lb (97.1 kg)  Body mass index is 33.52 kg/m.           Physical Examination:   General appearance: alert, well appearing, and in no distress  Mental status: alert, oriented to person, place, and time  Skin: warm & dry   Extremities: Edema: None    Cardiovascular: normal heart rate noted  Respiratory: normal respiratory effort, no distress  Abdomen: gravid, soft, non-tender  Pelvic: Cervical exam performed       LTC  Fetal Status: Fetal Heart Rate (bpm): 135 Fundal Height: 32 cm Movement: Present    Fetal Surveillance Testing today:    Results for orders placed or performed in visit on 04/20/18 (from the past 24 hour(s))  POC Urinalysis Dipstick OB   Collection Time: 04/20/18 11:55 AM  Result Value Ref Range   Color, UA     Clarity, UA     Glucose, UA Negative Negative   Bilirubin, UA     Ketones, UA neg    Spec Grav, UA     Blood, UA neg    pH, UA     POC,PROTEIN,UA Trace Negative, Trace   Urobilinogen, UA     Nitrite, UA neg    Leukocytes, UA Negative Negative   Appearance     Odor      Assessment & Plan:  1) High-risk pregnancy [id number] at [redacted]w[redacted]d with an Estimated Date of Delivery: 06/14/18   2) Chronic opiate abuse, no heroin in 3 weeks, use subutex(street) 3-4 days ago, SW trying to et her in with [facility] her in [place]  3) Minimal PNC,   Meds: No orders of the defined types were placed in this encounter.   Labs/procedures today:   Treatment Plan:  1 week, PN2  Reviewed: Preterm labor symptoms and general obstetric precautions including but not limited to vaginal bleeding, contractions, leaking of fluid and fetal movement were reviewed in detail with the patient.  All questions were answered.  Follow-up: Return in about 1 week (around 04/27/2018) for PN2, , HROB.  Orders Placed This Encounter  Procedures  . POC Urinalysis Dipstick OB   Amaryllis Dyke Jaqualin Serpa  04/20/2018 12:21 PM

## 2018-04-30 ENCOUNTER — Encounter: Payer: Medicaid Other | Admitting: Obstetrics & Gynecology

## 2018-04-30 ENCOUNTER — Other Ambulatory Visit: Payer: Medicaid Other

## 2018-05-11 ENCOUNTER — Encounter: Payer: Self-pay | Admitting: Women's Health

## 2018-05-11 DIAGNOSIS — F1120 Opioid dependence, uncomplicated: Secondary | ICD-10-CM | POA: Insufficient documentation

## 2018-05-11 DIAGNOSIS — O99320 Drug use complicating pregnancy, unspecified trimester: Secondary | ICD-10-CM

## 2018-05-19 ENCOUNTER — Encounter: Payer: Medicaid Other | Admitting: Obstetrics & Gynecology

## 2018-05-19 ENCOUNTER — Other Ambulatory Visit: Payer: Medicaid Other

## 2018-05-25 ENCOUNTER — Ambulatory Visit (INDEPENDENT_AMBULATORY_CARE_PROVIDER_SITE_OTHER): Payer: Medicaid Other | Admitting: Obstetrics & Gynecology

## 2018-05-25 ENCOUNTER — Encounter: Payer: Self-pay | Admitting: Obstetrics & Gynecology

## 2018-05-25 ENCOUNTER — Other Ambulatory Visit: Payer: Medicaid Other

## 2018-05-25 VITALS — BP 89/54 | HR 72 | Wt 220.5 lb

## 2018-05-25 DIAGNOSIS — O99323 Drug use complicating pregnancy, third trimester: Secondary | ICD-10-CM

## 2018-05-25 DIAGNOSIS — Z23 Encounter for immunization: Secondary | ICD-10-CM | POA: Diagnosis not present

## 2018-05-25 DIAGNOSIS — Z3483 Encounter for supervision of other normal pregnancy, third trimester: Secondary | ICD-10-CM

## 2018-05-25 DIAGNOSIS — O0993 Supervision of high risk pregnancy, unspecified, third trimester: Secondary | ICD-10-CM

## 2018-05-25 DIAGNOSIS — Z1389 Encounter for screening for other disorder: Secondary | ICD-10-CM

## 2018-05-25 DIAGNOSIS — Z331 Pregnant state, incidental: Secondary | ICD-10-CM

## 2018-05-25 DIAGNOSIS — Z3A37 37 weeks gestation of pregnancy: Secondary | ICD-10-CM

## 2018-05-25 LAB — POCT URINALYSIS DIPSTICK OB
Glucose, UA: NEGATIVE
KETONES UA: NEGATIVE
NITRITE UA: NEGATIVE
RBC UA: NEGATIVE

## 2018-05-25 NOTE — Progress Notes (Signed)
   HIGH-RISK PREGNANCY VISIT Patient name: Brittney CromerHeather E Nicholson MRN [id number]  Date of birth: February 11, 1991 Chief Complaint:   Routine Prenatal Visit (PN2 today; GBS, GC/CHL; pelvic pain worse with walking )  History of Present Illness:   Brittney CromerHeather E Nicholson is a 27 y.o. [id number] female at 359w1d with an Estimated Date of Delivery: 06/14/18 being seen today for ongoing management of a high-risk pregnancy complicated by heroin abuse .  Today she reports no complaints. Contractions: Not present. Vag. Bleeding: None.  Movement: Present. denies leaking of fluid.  Review of Systems:   Pertinent items are noted in HPI Denies abnormal vaginal discharge w/ itching/odor/irritation, headaches, visual changes, shortness of breath, chest pain, abdominal pain, severe nausea/vomiting, or problems with urination or bowel movements unless otherwise stated above. Pertinent History Reviewed:  Reviewed past medical,surgical, social, obstetrical and family history.  Reviewed problem list, medications and allergies. Physical Assessment:   Vitals:   05/25/18 0945  BP: (!) 89/54  Pulse: 72  Weight: 220 lb 8 oz (100 kg)  Body mass index is 34.54 kg/m.           Physical Examination:   General appearance: alert, well appearing, and in no distress  Mental status: alert, oriented to person, place, and time  Skin: warm & dry   Extremities: Edema: Trace    Cardiovascular: normal heart rate noted  Respiratory: normal respiratory effort, no distress  Abdomen: gravid, soft, non-tender  Pelvic: Cervical exam performed  Dilation: 2 Effacement (%): 40 Station: -3  Fetal Status: Fetal Heart Rate (bpm): 135 Fundal Height: 36 cm Movement: Present    Fetal Surveillance Testing today: FHR 135   Results for orders placed or performed in visit on 05/25/18 (from the past 24 hour(s))  POC Urinalysis Dipstick OB   Collection Time: 05/25/18  9:46 AM  Result Value Ref Range   Color, UA     Clarity, UA     Glucose, UA Negative  Negative   Bilirubin, UA     Ketones, UA neg    Spec Grav, UA     Blood, UA neg    pH, UA     POC,PROTEIN,UA Small (1+) Negative, Trace, Small (1+), Moderate (2+), Large (3+), 4+   Urobilinogen, UA     Nitrite, UA neg    Leukocytes, UA Large (3+) (A) Negative   Appearance     Odor      Assessment & Plan:  1) High-risk pregnancy [id number] at 759w1d with an Estimated Date of Delivery: 06/14/18   2) Heroin drug abuse, stable. Pt denies any current use, now on subutex 4 mg TID, weekly conselling    Meds: No orders of the defined types were placed in this encounter.   Labs/procedures today: cultures  Treatment Plan:  OBV 1 week  Reviewed: Term labor symptoms and general obstetric precautions including but not limited to vaginal bleeding, contractions, leaking of fluid and fetal movement were reviewed in detail with the patient.  All questions were answered.  Follow-up: Return in about 1 week (around 06/01/2018).  Orders Placed This Encounter  Procedures  . GC/Chlamydia Probe Amp  . Strep Gp B NAA  . Tdap vaccine greater than or equal to 7yo IM  . POC Urinalysis Dipstick OB   Amaryllis DykeLuther H Nilesh Stegall  05/25/2018 10:24 AM

## 2018-05-26 ENCOUNTER — Telehealth: Payer: Self-pay | Admitting: *Deleted

## 2018-05-26 ENCOUNTER — Ambulatory Visit (INDEPENDENT_AMBULATORY_CARE_PROVIDER_SITE_OTHER): Payer: Self-pay | Admitting: Advanced Practice Midwife

## 2018-05-26 VITALS — BP 112/64 | HR 98 | Ht 67.0 in | Wt 217.6 lb

## 2018-05-26 DIAGNOSIS — Z1389 Encounter for screening for other disorder: Secondary | ICD-10-CM

## 2018-05-26 DIAGNOSIS — Z331 Pregnant state, incidental: Secondary | ICD-10-CM

## 2018-05-26 DIAGNOSIS — Z3A37 37 weeks gestation of pregnancy: Secondary | ICD-10-CM

## 2018-05-26 DIAGNOSIS — Z0130 Encounter for examination of blood pressure without abnormal findings: Secondary | ICD-10-CM

## 2018-05-26 DIAGNOSIS — Z3483 Encounter for supervision of other normal pregnancy, third trimester: Secondary | ICD-10-CM

## 2018-05-26 LAB — POCT URINALYSIS DIPSTICK OB
GLUCOSE, UA: NEGATIVE
Ketones, UA: NEGATIVE
Leukocytes, UA: NEGATIVE
Nitrite, UA: NEGATIVE
RBC UA: NEGATIVE

## 2018-05-26 LAB — CBC
HEMATOCRIT: 37.4 % (ref 34.0–46.6)
HEMOGLOBIN: 12.4 g/dL (ref 11.1–15.9)
MCH: 27.4 pg (ref 26.6–33.0)
MCHC: 33.2 g/dL (ref 31.5–35.7)
MCV: 83 fL (ref 79–97)
PLATELETS: 160 10*3/uL (ref 150–450)
RBC: 4.53 x10E6/uL (ref 3.77–5.28)
RDW: 13 % (ref 12.3–15.4)
WBC: 8 10*3/uL (ref 3.4–10.8)

## 2018-05-26 LAB — HIV ANTIBODY (ROUTINE TESTING W REFLEX): HIV Screen 4th Generation wRfx: NONREACTIVE

## 2018-05-26 LAB — ANTIBODY SCREEN: ANTIBODY SCREEN: NEGATIVE

## 2018-05-26 LAB — GLUCOSE TOLERANCE, 2 HOURS W/ 1HR
GLUCOSE, FASTING: 73 mg/dL (ref 65–91)
Glucose, 1 hour: 131 mg/dL (ref 65–179)
Glucose, 2 hour: 107 mg/dL (ref 65–152)

## 2018-05-26 LAB — RPR: RPR Ser Ql: NONREACTIVE

## 2018-05-26 NOTE — Progress Notes (Signed)
WORK IN FOR TAKING BP AT HOME AND SAID IT WAS 150/120.  Also c/o right hand swollen and itchy, received TDAP yesterday, wondering if it's related.    LOW-RISK PREGNANCY VISIT Patient name: Brittney CromerHeather E Nicholson MRN [id number]  Date of birth: 18-Apr-1991 Chief Complaint:   Blood Pressure Check (right hand swollen)  Review of Systems:   Pertinent items are noted in HPI Denies abnormal vaginal discharge w/ itching/odor/irritation, headaches, visual changes, shortness of breath, chest pain, abdominal pain, severe nausea/vomiting, or problems with urination or bowel movements unless otherwise stated above.  Pertinent History Reviewed:  Medical & Surgical Hx:   Past Medical History:  Diagnosis Date  . Anxiety   . Depression   . 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 Headache    with this pregnancy  . HSV-2 infection complicating pregnancy   . Pneumonia   . Pregnant 12/13/2014  . Warts    Past Surgical History:  Procedure Laterality Date  . BREAST LUMPECTOMY  2006  . CHOLECYSTECTOMY N/A 01/25/2013   Procedure: LAPAROSCOPIC CHOLECYSTECTOMY;  Surgeon: Dalia HeadingMark A Jenkins, MD;  Location: [facility];  Service: General;  Laterality: N/A;  . right arm Right    has pins.  . TONSILLECTOMY    . TONSILLECTOMY  2011   Family History  Problem Relation Age of Onset  . Cancer Mother        uterine  . Diabetes Father   . Kidney failure Father     Current Outpatient Medications:  .  acetaminophen (TYLENOL) 325 MG tablet, Take 650 mg by mouth every 6 (six) hours as needed for mild pain. , Disp: , Rfl:  .  buprenorphine (SUBUTEX) 8 MG SUBL SL tablet, Place 4 mg under the tongue 3 (three) times daily., Disp: , Rfl:  .  loperamide (IMODIUM) 2 MG capsule, Take 2 mg by mouth as needed for diarrhea or loose stools., Disp: , Rfl:  .  Prenatal Vit-Fe Fumarate-FA (PRENATAL VITAMIN PO), Take 1 tablet by mouth daily. , Disp: , Rfl:  Social History: Reviewed -  reports that she has been smoking cigarettes. She has been smoking about  1.00 pack per day. She has never used smokeless tobacco.  Physical Assessment:   Vitals:   05/26/18 1049  BP: 112/64  Pulse: 98  Weight: 217 lb 9.6 oz (98.7 kg)  Height: 5\' 7"  (1.702 m)  Body mass index is 34.08 kg/m.        Physical Examination:   General appearance: Well appearing, and in no distress  Mental status: Alert, oriented to person, place, and time  Skin: Warm & dry  Cardiovascular: Normal heart rate noted  Respiratory: Normal respiratory effort, no distress  Abdomen: Soft, gravid, nontender  Pelvic: Cervical exam deferred         Extremities:  right hand swollen and itchy. Co exam w LHE, most likely r/t bee sting yesterday  Fetal Status: Fetal Heart Rate (bpm): 149        Results for orders placed or performed in visit on 05/26/18 (from the past 24 hour(s))  POC Urinalysis Dipstick OB   Collection Time: 05/26/18 10:59 AM  Result Value Ref Range   Color, UA     Clarity, UA     Glucose, UA Negative Negative   Bilirubin, UA     Ketones, UA neg    Spec Grav, UA     Blood, UA neg    pH, UA     POC,PROTEIN,UA Trace Negative, Trace, Small (1+), Moderate (2+), Large (  3+), 4+   Urobilinogen, UA     Nitrite, UA neg    Leukocytes, UA Negative Negative   Appearance     Odor      Assessment & Plan:  1) Low-risk pregnancy [id number] at [redacted]w[redacted]d with an Estimated Date of Delivery: 06/14/18   2) Histamine reaction to bee sting:  Ice/elevation/benadryl  Follow-up: Return for As scheduled. Bring BP cuff so we can make sure it's calibrated  Orders Placed This Encounter  Procedures  . POC Urinalysis Dipstick OB   Jacklyn Shell CNM 05/26/2018 11:39 AM

## 2018-05-26 NOTE — Telephone Encounter (Signed)
Patient called stating she received her TDAP injection yesterday in our office and has since noticed swelling in her hands.  She checked her BP at home this morning, as she does daily and noted it was 152/120. States normally her BP is low.  Advised she needed to come to our office for a BP check.  Verbalized understanding.

## 2018-05-27 LAB — GC/CHLAMYDIA PROBE AMP
Chlamydia trachomatis, NAA: NEGATIVE
Neisseria gonorrhoeae by PCR: NEGATIVE

## 2018-05-27 LAB — STREP GP B NAA: STREP GROUP B AG: POSITIVE — AB

## 2018-06-01 ENCOUNTER — Other Ambulatory Visit: Payer: Self-pay

## 2018-06-01 ENCOUNTER — Encounter: Payer: Self-pay | Admitting: Obstetrics & Gynecology

## 2018-06-01 ENCOUNTER — Ambulatory Visit (INDEPENDENT_AMBULATORY_CARE_PROVIDER_SITE_OTHER): Payer: Medicaid Other | Admitting: Obstetrics & Gynecology

## 2018-06-01 VITALS — BP 108/59 | HR 75 | Wt 222.0 lb

## 2018-06-01 DIAGNOSIS — Z3A38 38 weeks gestation of pregnancy: Secondary | ICD-10-CM

## 2018-06-01 DIAGNOSIS — Z1389 Encounter for screening for other disorder: Secondary | ICD-10-CM

## 2018-06-01 DIAGNOSIS — Z3483 Encounter for supervision of other normal pregnancy, third trimester: Secondary | ICD-10-CM

## 2018-06-01 DIAGNOSIS — Z331 Pregnant state, incidental: Secondary | ICD-10-CM

## 2018-06-01 LAB — POCT URINALYSIS DIPSTICK OB
GLUCOSE, UA: NEGATIVE
KETONES UA: NEGATIVE
Nitrite, UA: NEGATIVE
POC,PROTEIN,UA: NEGATIVE
RBC UA: NEGATIVE

## 2018-06-01 NOTE — Progress Notes (Signed)
   LOW-RISK PREGNANCY VISIT Patient name: Brittney Nicholson MRN [id number]  Date of birth: 29-Sep-1990 Chief Complaint:   Routine Prenatal Visit  History of Present Illness:   Brittney Nicholson is a 27 y.o. [id number] female at 4731w1d with an Estimated Date of Delivery: 06/14/18 being seen today for ongoing management of a low-risk pregnancy.  Today she reports no complaints. Contractions: Irregular. Vag. Bleeding: None.  Movement: Present. denies leaking of fluid. Review of Systems:   Pertinent items are noted in HPI Denies abnormal vaginal discharge w/ itching/odor/irritation, headaches, visual changes, shortness of breath, chest pain, abdominal pain, severe nausea/vomiting, or problems with urination or bowel movements unless otherwise stated above. Pertinent History Reviewed:  Reviewed past medical,surgical, social, obstetrical and family history.  Reviewed problem list, medications and allergies. Physical Assessment:   Vitals:   06/01/18 1508  BP: (!) 108/59  Pulse: 75  Weight: 222 lb (100.7 kg)  Body mass index is 34.77 kg/m.        Physical Examination:   General appearance: Well appearing, and in no distress  Mental status: Alert, oriented to person, place, and time  Skin: Warm & dry  Cardiovascular: Normal heart rate noted  Respiratory: Normal respiratory effort, no distress  Abdomen: Soft, gravid, nontender  Pelvic: Cervical exam performed        2/th/-3/vertex  Extremities: Edema: None  Fetal Status: Fetal Heart Rate (bpm): 128 Fundal Height: 38 cm Movement: Present    Results for orders placed or performed in visit on 06/01/18 (from the past 24 hour(s))  POC Urinalysis Dipstick OB   Collection Time: 06/01/18  3:08 PM  Result Value Ref Range   Color, UA     Clarity, UA     Glucose, UA Negative Negative   Bilirubin, UA     Ketones, UA neg    Spec Grav, UA     Blood, UA neg    pH, UA     POC,PROTEIN,UA Negative Negative, Trace, Small (1+), Moderate (2+), Large (3+),  4+   Urobilinogen, UA     Nitrite, UA neg    Leukocytes, UA Small (1+) (A) Negative   Appearance     Odor      Assessment & Plan:  1) Low-risk pregnancy [id number] at 4931w1d with an Estimated Date of Delivery: 06/14/18   2) Subutex, 4 mg TID, keeping pt stable   Meds: No orders of the defined types were placed in this encounter.  Labs/procedures today:   Plan:  Continue routine obstetrical care   Reviewed: Term labor symptoms and general obstetric precautions including but not limited to vaginal bleeding, contractions, leaking of fluid and fetal movement were reviewed in detail with the patient.  All questions were answered  Follow-up: Return in about 1 week (around 06/08/2018) for LROB.  Orders Placed This Encounter  Procedures  . POC Urinalysis Dipstick OB   Lazaro ArmsLuther H Algenis Ballin  06/01/2018 3:44 PM

## 2018-06-08 ENCOUNTER — Ambulatory Visit (INDEPENDENT_AMBULATORY_CARE_PROVIDER_SITE_OTHER): Payer: Medicaid Other | Admitting: Obstetrics & Gynecology

## 2018-06-08 VITALS — BP 120/68 | HR 104 | Wt 219.0 lb

## 2018-06-08 DIAGNOSIS — Z3483 Encounter for supervision of other normal pregnancy, third trimester: Secondary | ICD-10-CM

## 2018-06-08 DIAGNOSIS — Z331 Pregnant state, incidental: Secondary | ICD-10-CM

## 2018-06-08 DIAGNOSIS — Z1389 Encounter for screening for other disorder: Secondary | ICD-10-CM

## 2018-06-08 DIAGNOSIS — Z3A39 39 weeks gestation of pregnancy: Secondary | ICD-10-CM

## 2018-06-08 LAB — POCT URINALYSIS DIPSTICK OB
Blood, UA: NEGATIVE
GLUCOSE, UA: NEGATIVE
Ketones, UA: NEGATIVE
Nitrite, UA: NEGATIVE

## 2018-06-08 NOTE — Progress Notes (Signed)
   LOW-RISK PREGNANCY VISIT Patient name: Brittney CromerHeather E Chiao MRN [id number]  Date of birth: August 18, 1990 Chief Complaint:   Routine Prenatal Visit  History of Present Illness:   Brittney CromerHeather E Tolen is a 27 y.o. [id number] female at 6413w1d with an Estimated Date of Delivery: 06/14/18 being seen today for ongoing management of a low-risk pregnancy.  Today she reports no complaints. Contractions: Irregular. Vag. Bleeding: None.  Movement: Present. denies leaking of fluid. Review of Systems:   Pertinent items are noted in HPI Denies abnormal vaginal discharge w/ itching/odor/irritation, headaches, visual changes, shortness of breath, chest pain, abdominal pain, severe nausea/vomiting, or problems with urination or bowel movements unless otherwise stated above. Pertinent History Reviewed:  Reviewed past medical,surgical, social, obstetrical and family history.  Reviewed problem list, medications and allergies. Physical Assessment:   Vitals:   06/08/18 1417  BP: 120/68  Pulse: (!) 104  Weight: 219 lb (99.3 kg)  Body mass index is 34.3 kg/m.        Physical Examination:   General appearance: Well appearing, and in no distress  Mental status: Alert, oriented to person, place, and time  Skin: Warm & dry  Cardiovascular: Normal heart rate noted  Respiratory: Normal respiratory effort, no distress  Abdomen: Soft, gravid, nontender  Pelvic: Cervical exam performed        2/th/-2/soft/post Extremities: Edema: None  Fetal Status:     Movement: Present    Results for orders placed or performed in visit on 06/08/18 (from the past 24 hour(s))  POC Urinalysis Dipstick OB   Collection Time: 06/08/18  2:21 PM  Result Value Ref Range   Color, UA     Clarity, UA     Glucose, UA Negative Negative   Bilirubin, UA     Ketones, UA neg    Spec Grav, UA     Blood, UA neg    pH, UA     POC,PROTEIN,UA Small (1+) Negative, Trace, Small (1+), Moderate (2+), Large (3+), 4+   Urobilinogen, UA     Nitrite, UA neg     Leukocytes, UA Large (3+) (A) Negative   Appearance     Odor      Assessment & Plan:  1) Low-risk pregnancy [id number] at 3413w1d with an Estimated Date of Delivery: 06/14/18   2) Stable subutex therapy   Meds: No orders of the defined types were placed in this encounter.  Labs/procedures today:   Plan:  Continue routine obstetrical care   Reviewed: Term labor symptoms and general obstetric precautions including but not limited to vaginal bleeding, contractions, leaking of fluid and fetal movement were reviewed in detail with the patient.  All questions were answered  Follow-up: Return in about 1 week (around 06/15/2018) for LROB.  Orders Placed This Encounter  Procedures  . POC Urinalysis Dipstick OB   Lazaro ArmsLuther H Yu Peggs MD 06/08/2018 2:41 PM

## 2018-06-10 NOTE — L&D Delivery Note (Signed)
Patient: Brittney Nicholson MRN: [id number]  GBS status: positive, IAP given  Patient is a 28 y.o. now [id number] s/p NSVD at [redacted]w[redacted]d, who was admitted for IOL for NRFHT. SROM 3h 31m prior to delivery with clear fluid.    Delivery Note At 1:10 AM a viable female was delivered via Vaginal, Spontaneous (Presentation: LOA ;  ).  APGAR: 9, 9; weight pending .   Placenta status: intact, spontaneous .  Cord: 3 vessel   Anesthesia:  Epidural  Episiotomy: None Lacerations: None Suture Repair: n/a Est. Blood Loss (mL): 50  Mom to postpartum.  Baby to Couplet care / Skin to Skin.  Sigurd Sos Athena Baltz 06/20/2018, 1:32 AM   Head delivered LOA. No nuchal cord present. Shoulder and body delivered in usual fashion. Infant with spontaneous cry, placed on mother's abdomen, dried and bulb suctioned. Cord clamped x 2 after 1-minute delay, and cut by family member. Cord blood drawn. Placenta delivered spontaneously with gentle cord traction. Fundus firm with massage and Pitocin. Perineum inspected and found to be intact.

## 2018-06-10 NOTE — L&D Delivery Note (Addendum)
OB/GYN Faculty Practice Delivery Note  Brittney Nicholson is a 29 y.o. [id number] s/p VD at [redacted]w[redacted]d. She was admitted for spontaneous onset of labor.   ROM: 22h 47m with clear fluid GBS Status: Penicillin given --/Positive (12/16 1130) Maximum Maternal Temperature:  Temp (24hrs), Avg:98.3 F (36.8 C), Min:97.9 F (36.6 C), Max:99.1 F (37.3 C)    Labor Progress: . Patient arrived at 1.5 cm dilation and was induced with Cytotec, Pitocin, Foley bulb.  Delivery Date/Time: 04/12/2019 at 0454 Delivery: Called to room and patient was complete and pushing. Head delivered in ROA position.  2 looped nuchal cord was present.  Baby was delivered through nuchal cords. Shoulder and body delivered in usual fashion.  Infant was somersaulted to release nuchal cords.  Infant with spontaneous cry, placed on mother's abdomen, dried and stimulated. Cord clamped x 2 after 1-minute delay, and cut by father of the baby under my direct supervision. Cord blood drawn. Placenta delivered spontaneously with gentle cord traction. Fundus firm with massage and Pitocin. Labia, perineum, vagina, and cervix inspected with no tears  Placenta: Spontaneous, intact, three-vessel cord Complications: None Lacerations: None EBL: 257 Analgesia: Epidural  Infant: APGAR (1 MIN): 9   APGAR (5 MINS): 9    Weight: Pending  Gifford Shave, MD  PGY-1, [facility]  04/12/2019 5:17 AM  Patient is a 28 y.o. at [redacted]w[redacted]d who was admitted for PPROM, significant hx of substance abuse, no PNC, and SGA.  She progressed with augmentation via cytotec, FB, pitocin.  I was gloved and present for delivery in its entirety.  Called to room by RN for prolonged deceleration and found to be complete on exam. Second stage of labor progressed, baby delivered after 2 contractions.   Complications: nuchal x2, delivered through and reduced after delivery  Lacerations: none  Wende Mott, CNM 5:50 AM

## 2018-06-13 ENCOUNTER — Encounter: Payer: Self-pay | Admitting: Obstetrics & Gynecology

## 2018-06-13 ENCOUNTER — Telehealth: Payer: Self-pay | Admitting: Obstetrics & Gynecology

## 2018-06-13 NOTE — Telephone Encounter (Signed)
Faculty Practice OB/GYN Physician Phone Call Documentation  I received a call from Brittney Nicholson while on L&D, with report of possible SROM of clear fluid. No bleeding.  Good FM. She was told to come to the [facility] MAU for evaluation and management.  MAU staff notified.   [name], MD, FACOG Obstetrician & Gynecologist, [facility] for [employer], [facility]

## 2018-06-15 ENCOUNTER — Ambulatory Visit (INDEPENDENT_AMBULATORY_CARE_PROVIDER_SITE_OTHER): Payer: Self-pay | Admitting: Obstetrics & Gynecology

## 2018-06-15 VITALS — BP 117/69 | HR 83 | Wt 221.0 lb

## 2018-06-15 DIAGNOSIS — Z3A40 40 weeks gestation of pregnancy: Secondary | ICD-10-CM

## 2018-06-15 DIAGNOSIS — Z1389 Encounter for screening for other disorder: Secondary | ICD-10-CM

## 2018-06-15 DIAGNOSIS — Z3483 Encounter for supervision of other normal pregnancy, third trimester: Secondary | ICD-10-CM

## 2018-06-15 DIAGNOSIS — Z331 Pregnant state, incidental: Secondary | ICD-10-CM

## 2018-06-15 DIAGNOSIS — F1190 Opioid use, unspecified, uncomplicated: Secondary | ICD-10-CM

## 2018-06-15 LAB — POCT URINALYSIS DIPSTICK OB
Blood, UA: NEGATIVE
Glucose, UA: NEGATIVE
KETONES UA: NEGATIVE
Leukocytes, UA: NEGATIVE
Nitrite, UA: NEGATIVE
PROTEIN: NEGATIVE

## 2018-06-15 NOTE — Progress Notes (Signed)
   LOW-RISK PREGNANCY VISIT Patient name: Brittney Nicholson MRN [id number]  Date of birth: 12/03/1990 Chief Complaint:   Routine Prenatal Visit  History of Present Illness:   Brittney Nicholson is a 28 y.o. [id number] female at [redacted]w[redacted]d with an Estimated Date of Delivery: 06/14/18 being seen today for ongoing management of a low-risk pregnancy.  Today she reports no complaints. Contractions: Irritability. Vag. Bleeding: None.  Movement: Present. denies leaking of fluid. Review of Systems:   Pertinent items are noted in HPI Denies abnormal vaginal discharge w/ itching/odor/irritation, headaches, visual changes, shortness of breath, chest pain, abdominal pain, severe nausea/vomiting, or problems with urination or bowel movements unless otherwise stated above. Pertinent History Reviewed:  Reviewed past medical,surgical, social, obstetrical and family history.  Reviewed problem list, medications and allergies. Physical Assessment:   Vitals:   06/15/18 1443  BP: 117/69  Pulse: 83  Weight: 221 lb (100.2 kg)  Body mass index is 34.61 kg/m.        Physical Examination:   General appearance: Well appearing, and in no distress  Mental status: Alert, oriented to person, place, and time  Skin: Warm & dry  Cardiovascular: Normal heart rate noted  Respiratory: Normal respiratory effort, no distress  Abdomen: Soft, gravid, nontender  Pelvic: Cervical exam performed  Dilation: 3 Effacement (%): 50 Station: -23/50/-2/soft/post  Extremities: Edema: None  Fetal Status: Fetal Heart Rate (bpm): 135 Fundal Height: 37 cm Movement: Present Presentation: Vertex  Results for orders placed or performed in visit on 06/15/18 (from the past 24 hour(s))  POC Urinalysis Dipstick OB   Collection Time: 06/15/18  3:10 PM  Result Value Ref Range   Color, UA     Clarity, UA     Glucose, UA Negative Negative   Bilirubin, UA     Ketones, UA neg    Spec Grav, UA     Blood, UA neg    pH, UA     POC,PROTEIN,UA Negative  Negative, Trace, Small (1+), Moderate (2+), Large (3+), 4+   Urobilinogen, UA     Nitrite, UA neg    Leukocytes, UA Negative Negative   Appearance     Odor      Assessment & Plan:  1) Low-risk pregnancy [id number] at [redacted]w[redacted]d with an Estimated Date of Delivery: 06/14/18   2) Impending post dates, reative NST   Meds: No orders of the defined types were placed in this encounter.  Labs/procedures today:   Plan:  Continue routine obstetrical care   Reviewed: Term labor symptoms and general obstetric precautions including but not limited to vaginal bleeding, contractions, leaking of fluid and fetal movement were reviewed in detail with the patient.  All questions were answered  Follow-up: Return in about 4 days (around 06/19/2018) for LROB, with Dr Despina Hidden.  Orders Placed This Encounter  Procedures  . POC Urinalysis Dipstick OB   Amaryllis Dyke   06/15/2018 3:24 PM

## 2018-06-17 ENCOUNTER — Ambulatory Visit (INDEPENDENT_AMBULATORY_CARE_PROVIDER_SITE_OTHER): Payer: Medicaid Other | Admitting: Advanced Practice Midwife

## 2018-06-17 ENCOUNTER — Encounter: Payer: Self-pay | Admitting: Advanced Practice Midwife

## 2018-06-17 ENCOUNTER — Telehealth: Payer: Self-pay | Admitting: Obstetrics & Gynecology

## 2018-06-17 VITALS — BP 107/64 | HR 93 | Wt 221.0 lb

## 2018-06-17 DIAGNOSIS — Z1389 Encounter for screening for other disorder: Secondary | ICD-10-CM

## 2018-06-17 DIAGNOSIS — Z331 Pregnant state, incidental: Secondary | ICD-10-CM

## 2018-06-17 DIAGNOSIS — Z3483 Encounter for supervision of other normal pregnancy, third trimester: Secondary | ICD-10-CM

## 2018-06-17 LAB — POCT URINALYSIS DIPSTICK OB
Blood, UA: 3
Glucose, UA: NEGATIVE
KETONES UA: NEGATIVE
NITRITE UA: NEGATIVE

## 2018-06-17 NOTE — Telephone Encounter (Signed)
Patient called tearful stating she had her membranes swept on Monday and last night had some bleeding along with mucous discharge. She is also having back and rectal pain but is uncertain if they are contractions. Will w/i for cervical check.

## 2018-06-17 NOTE — Progress Notes (Signed)
WORK IN FOR VB AFTER. MEMBRANES STRIPPED Monday, Bleeding has now "slowed"  [id number] [redacted]w[redacted]d Estimated Date of Delivery: 06/14/18  Blood pressure 107/64, pulse 93, weight 221 lb (100.2 kg), currently breastfeeding.   BP weight and urine results all reviewed and noted.  Please refer to the obstetrical flow sheet for the fundal height and fetal heart rate documentation:  Patient reports good fetal movement, no ctx.    Physical Assessment:   Vitals:   06/17/18 1348  BP: 107/64  Pulse: 93  Weight: 221 lb (100.2 kg)  Body mass index is 34.61 kg/m.        Physical Examination:   General appearance: Well appearing, and in no distress  Mental status: Alert, oriented to person, place, and time  Skin: Warm & dry  Cardiovascular: Normal heart rate noted  Respiratory: Normal respiratory effort, no distress  Abdomen: Soft, gravid, nontender  Pelvic: Cervical exam performed  Dilation: 4 Effacement (%): 70 Station: -2SSE:  No bleeding whatsoever.   Extremities: Edema: None  Fetal Status:     Movement: Present Presentation: Vertex  Results for orders placed or performed in visit on 06/17/18 (from the past 24 hour(s))  POC Urinalysis Dipstick OB   Collection Time: 06/17/18  1:53 PM  Result Value Ref Range   Color, UA     Clarity, UA     Glucose, UA Negative Negative   Bilirubin, UA     Ketones, UA neg    Spec Grav, UA     Blood, UA 3    pH, UA     POC,PROTEIN,UA Moderate (2+) Negative, Trace, Small (1+), Moderate (2+), Large (3+), 4+   Urobilinogen, UA     Nitrite, UA neg    Leukocytes, UA Moderate (2+) (A) Negative   Appearance     Odor       Orders Placed This Encounter  Procedures  . POC Urinalysis Dipstick OB    Plan:  Continued routine obstetrical care,   Return for As scheduled.

## 2018-06-17 NOTE — Telephone Encounter (Signed)
Patient called, stated she had bloody slug looking stuff when she went to the bathroom this morning.  Patient is 40 weeks and 3 days.  [phone]

## 2018-06-19 ENCOUNTER — Encounter (HOSPITAL_COMMUNITY): Payer: Self-pay | Admitting: *Deleted

## 2018-06-19 ENCOUNTER — Ambulatory Visit (INDEPENDENT_AMBULATORY_CARE_PROVIDER_SITE_OTHER): Payer: Medicaid Other | Admitting: Obstetrics & Gynecology

## 2018-06-19 ENCOUNTER — Inpatient Hospital Stay (HOSPITAL_COMMUNITY)
Admission: AD | Admit: 2018-06-19 | Discharge: 2018-06-22 | DRG: 806 | Disposition: A | Discharge status: Home / Self Care | Payer: Medicaid Other | Attending: Obstetrics & Gynecology | Admitting: Obstetrics & Gynecology

## 2018-06-19 ENCOUNTER — Inpatient Hospital Stay (HOSPITAL_COMMUNITY): Payer: Medicaid Other | Admitting: Anesthesiology

## 2018-06-19 ENCOUNTER — Other Ambulatory Visit: Payer: Self-pay

## 2018-06-19 ENCOUNTER — Encounter: Payer: Self-pay | Admitting: Obstetrics & Gynecology

## 2018-06-19 VITALS — BP 96/56 | HR 82 | Wt 219.0 lb

## 2018-06-19 DIAGNOSIS — F1120 Opioid dependence, uncomplicated: Secondary | ICD-10-CM

## 2018-06-19 DIAGNOSIS — Z3490 Encounter for supervision of normal pregnancy, unspecified, unspecified trimester: Secondary | ICD-10-CM

## 2018-06-19 DIAGNOSIS — O99824 Streptococcus B carrier state complicating childbirth: Secondary | ICD-10-CM | POA: Diagnosis present

## 2018-06-19 DIAGNOSIS — Z3A40 40 weeks gestation of pregnancy: Secondary | ICD-10-CM | POA: Diagnosis not present

## 2018-06-19 DIAGNOSIS — O09899 Supervision of other high risk pregnancies, unspecified trimester: Secondary | ICD-10-CM

## 2018-06-19 DIAGNOSIS — Z1389 Encounter for screening for other disorder: Secondary | ICD-10-CM

## 2018-06-19 DIAGNOSIS — F1190 Opioid use, unspecified, uncomplicated: Secondary | ICD-10-CM | POA: Diagnosis present

## 2018-06-19 DIAGNOSIS — A6000 Herpesviral infection of urogenital system, unspecified: Secondary | ICD-10-CM | POA: Diagnosis present

## 2018-06-19 DIAGNOSIS — F1210 Cannabis abuse, uncomplicated: Secondary | ICD-10-CM | POA: Diagnosis present

## 2018-06-19 DIAGNOSIS — O480 Post-term pregnancy: Secondary | ICD-10-CM | POA: Diagnosis not present

## 2018-06-19 DIAGNOSIS — O99320 Drug use complicating pregnancy, unspecified trimester: Secondary | ICD-10-CM

## 2018-06-19 DIAGNOSIS — Z283 Underimmunization status: Secondary | ICD-10-CM

## 2018-06-19 DIAGNOSIS — Z87891 Personal history of nicotine dependence: Secondary | ICD-10-CM

## 2018-06-19 DIAGNOSIS — O99324 Drug use complicating childbirth: Secondary | ICD-10-CM | POA: Diagnosis present

## 2018-06-19 DIAGNOSIS — O288 Other abnormal findings on antenatal screening of mother: Secondary | ICD-10-CM | POA: Diagnosis present

## 2018-06-19 DIAGNOSIS — O9989 Other specified diseases and conditions complicating pregnancy, childbirth and the puerperium: Secondary | ICD-10-CM

## 2018-06-19 DIAGNOSIS — F418 Other specified anxiety disorders: Secondary | ICD-10-CM | POA: Diagnosis present

## 2018-06-19 DIAGNOSIS — Z3483 Encounter for supervision of other normal pregnancy, third trimester: Secondary | ICD-10-CM

## 2018-06-19 DIAGNOSIS — Z331 Pregnant state, incidental: Secondary | ICD-10-CM

## 2018-06-19 LAB — POCT URINALYSIS DIPSTICK OB
Blood, UA: NEGATIVE
Glucose, UA: NEGATIVE
KETONES UA: NEGATIVE
NITRITE UA: NEGATIVE

## 2018-06-19 LAB — CBC
HCT: 39.3 % (ref 36.0–46.0)
Hemoglobin: 12.4 g/dL (ref 12.0–15.0)
MCH: 26.8 pg (ref 26.0–34.0)
MCHC: 31.6 g/dL (ref 30.0–36.0)
MCV: 84.9 fL (ref 80.0–100.0)
Platelets: 167 10*3/uL (ref 150–400)
RBC: 4.63 MIL/uL (ref 3.87–5.11)
RDW: 14.3 % (ref 11.5–15.5)
WBC: 8 10*3/uL (ref 4.0–10.5)
nRBC: 0 % (ref 0.0–0.2)

## 2018-06-19 LAB — TYPE AND SCREEN
ABO/RH(D): O POS
Antibody Screen: NEGATIVE

## 2018-06-19 MED ORDER — PHENYLEPHRINE 40 MCG/ML (10ML) SYRINGE FOR IV PUSH (FOR BLOOD PRESSURE SUPPORT)
80.0000 ug | PREFILLED_SYRINGE | INTRAVENOUS | Status: DC | PRN
  Filled 2018-06-19: qty 10

## 2018-06-19 MED ORDER — PENICILLIN G 3 MILLION UNITS IVPB - SIMPLE MED
3.0000 10*6.[IU] | INTRAVENOUS | Status: DC
  Administered 2018-06-19 (×2): 3 10*6.[IU] via INTRAVENOUS
  Filled 2018-06-19 (×4): qty 100

## 2018-06-19 MED ORDER — ONDANSETRON HCL 4 MG/2ML IJ SOLN: 4.0000 mg | Freq: Four times a day (QID) | INTRAMUSCULAR | Status: DC | PRN

## 2018-06-19 MED ORDER — PHENYLEPHRINE 40 MCG/ML (10ML) SYRINGE FOR IV PUSH (FOR BLOOD PRESSURE SUPPORT)
80.0000 ug | PREFILLED_SYRINGE | INTRAVENOUS | Status: DC | PRN
  Filled 2018-06-19 (×2): qty 10

## 2018-06-19 MED ORDER — TERBUTALINE SULFATE 1 MG/ML IJ SOLN
0.2500 mg | Freq: Once | INTRAMUSCULAR | Status: DC | PRN
  Filled 2018-06-19: qty 1

## 2018-06-19 MED ORDER — SOD CITRATE-CITRIC ACID 500-334 MG/5ML PO SOLN: 30.0000 mL | ORAL | Status: DC | PRN

## 2018-06-19 MED ORDER — LIDOCAINE HCL (PF) 1 % IJ SOLN
30.0000 mL | INTRAMUSCULAR | Status: DC | PRN
  Filled 2018-06-19: qty 30

## 2018-06-19 MED ORDER — EPHEDRINE 5 MG/ML INJ
10.0000 mg | INTRAVENOUS | Status: DC | PRN
  Filled 2018-06-19: qty 2

## 2018-06-19 MED ORDER — FENTANYL 2.5 MCG/ML BUPIVACAINE 1/10 % EPIDURAL INFUSION (WH - ANES)
14.0000 mL/h | INTRAMUSCULAR | Status: DC | PRN
  Administered 2018-06-19: 14 mL/h via EPIDURAL
  Filled 2018-06-19: qty 100

## 2018-06-19 MED ORDER — LACTATED RINGERS IV SOLN
INTRAVENOUS | Status: DC
  Administered 2018-06-19 (×3): via INTRAVENOUS

## 2018-06-19 MED ORDER — LIDOCAINE HCL (PF) 1 % IJ SOLN
INTRAMUSCULAR | Status: DC | PRN
  Administered 2018-06-19 (×2): 4 mL via EPIDURAL

## 2018-06-19 MED ORDER — SODIUM CHLORIDE 0.9 % IV SOLN
5.0000 10*6.[IU] | Freq: Once | INTRAVENOUS | Status: AC
  Administered 2018-06-19: 5 10*6.[IU] via INTRAVENOUS
  Filled 2018-06-19: qty 5

## 2018-06-19 MED ORDER — BUPRENORPHINE HCL 8 MG SL SUBL
4.0000 mg | SUBLINGUAL_TABLET | Freq: Three times a day (TID) | SUBLINGUAL | Status: DC
  Administered 2018-06-19 (×2): 4 mg via SUBLINGUAL
  Filled 2018-06-19 (×2): qty 2

## 2018-06-19 MED ORDER — LACTATED RINGERS IV SOLN
500.0000 mL | INTRAVENOUS | Status: DC | PRN
  Administered 2018-06-19: 500 mL via INTRAVENOUS

## 2018-06-19 MED ORDER — OXYTOCIN BOLUS FROM INFUSION
500.0000 mL | Freq: Once | INTRAVENOUS | Status: AC
  Administered 2018-06-20: 500 mL via INTRAVENOUS

## 2018-06-19 MED ORDER — OXYCODONE-ACETAMINOPHEN 5-325 MG PO TABS: 2.0000 | ORAL_TABLET | ORAL | Status: DC | PRN

## 2018-06-19 MED ORDER — LACTATED RINGERS IV SOLN: 500.0000 mL | Freq: Once | INTRAVENOUS | Status: AC

## 2018-06-19 MED ORDER — LACTATED RINGERS IV SOLN
500.0000 mL | Freq: Once | INTRAVENOUS | Status: AC
  Administered 2018-06-19: 500 mL via INTRAVENOUS

## 2018-06-19 MED ORDER — ACETAMINOPHEN 325 MG PO TABS: 650.0000 mg | ORAL_TABLET | ORAL | Status: DC | PRN

## 2018-06-19 MED ORDER — DIPHENHYDRAMINE HCL 50 MG/ML IJ SOLN: 12.5000 mg | INTRAMUSCULAR | Status: DC | PRN

## 2018-06-19 MED ORDER — OXYTOCIN 40 UNITS IN NORMAL SALINE INFUSION - SIMPLE MED: 2.5000 [IU]/h | INTRAVENOUS | Status: DC

## 2018-06-19 MED ORDER — OXYCODONE-ACETAMINOPHEN 5-325 MG PO TABS: 1.0000 | ORAL_TABLET | ORAL | Status: DC | PRN

## 2018-06-19 MED ORDER — OXYTOCIN 40 UNITS IN NORMAL SALINE INFUSION - SIMPLE MED
1.0000 m[IU]/min | INTRAVENOUS | Status: DC
  Administered 2018-06-19: 2 m[IU]/min via INTRAVENOUS
  Filled 2018-06-19: qty 1000

## 2018-06-19 NOTE — Progress Notes (Signed)
Attempted cervical exam, unable to reach cervix.

## 2018-06-19 NOTE — Progress Notes (Signed)
LABOR PROGRESS NOTE  Brittney Nicholson is a 28 y.o. [id number] at [redacted]w[redacted]d  admitted for IOL for NRFHT.  Subjective: Doing well, requesting epidural   Objective: BP (!) 114/53   Pulse 67   Temp 98.1 F (36.7 C) (Oral)   Resp 16   Ht 5\' 7"  (1.702 m)   Wt 100.2 kg   LMP  (LMP Unknown)   BMI 34.61 kg/m  or  Vitals:   06/19/18 2100 06/19/18 2152 06/19/18 2230 06/19/18 2235  BP: (!) 109/51 (!) 119/51 116/86 (!) 114/53  Pulse: 81 87 76 67  Resp:      Temp:      TempSrc:      Weight:      Height:        SVE time: 2205 Dilation: 4 Effacement (%): 60 Cervical Position: Posterior Station: -2 Presentation: Vertex Exam by:: Karl Ito, rnc  FHT: baseline rate 12, moderate varibility, +accel, no decel Toco: every 1-3 minutes lasting 50-70 seconds  SROM 2200 clear fluid   Labs: Lab Results  Component Value Date   WBC 8.0 06/19/2018   HGB 12.4 06/19/2018   HCT 39.3 06/19/2018   MCV 84.9 06/19/2018   PLT 167 06/19/2018    Patient Active Problem List   Diagnosis Date Noted  . Non-reactive NST (non-stress test) 06/19/2018  . Pregnancy complicated by subutex maintenance, antepartum (HCC) 05/11/2018  . Rubella non-immune status, antepartum 03/19/2018  . Supervision of normal pregnancy 03/12/2018  . Opiate use 03/12/2018  . 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 Depression with anxiety 03/12/2018  . UTI (urinary tract infection) during pregnancy, second trimester 03/12/2018  . 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 Acute cholecystitis 01/25/2013  . Warts 08/18/2012    Assessment / Plan: 28 y.o. [id number] at 103w5d here for IOL for NRFHT  Toxicology screen pending  GBS+, pcn ordered Labor: active labor, progressing as expected, plan for cervical exam after epidural placement  Fetal Wellbeing:  Category 1 Pain Control:  Requesting epidural, in process Anticipated MOD:  SVD   06/19/2018, 10:40 PM

## 2018-06-19 NOTE — Progress Notes (Addendum)
   LOW-RISK PREGNANCY VISIT Patient name: Brittney Nicholson MRN [id number]  Date of birth: 1990/07/09 Chief Complaint:   Routine Prenatal Visit  History of Present Illness:   Brittney Nicholson is a 28 y.o. [id number] female at [redacted]w[redacted]d with an Estimated Date of Delivery: 06/14/18 being seen today for ongoing management of a low-risk pregnancy.  Today she reports no complaints. Contractions: Not present.  .  Movement: Present. denies leaking of fluid. Review of Systems:   Pertinent items are noted in HPI Denies abnormal vaginal discharge w/ itching/odor/irritation, headaches, visual changes, shortness of breath, chest pain, abdominal pain, severe nausea/vomiting, or problems with urination or bowel movements unless otherwise stated above. Pertinent History Reviewed:  Reviewed past medical,surgical, social, obstetrical and family history.  Reviewed problem list, medications and allergies. Physical Assessment:   Vitals:   06/19/18 0847  BP: (!) 96/56  Pulse: 82  Weight: 219 lb (99.3 kg)  Body mass index is 34.3 kg/m.        Physical Examination:   General appearance: Well appearing, and in no distress  Mental status: Alert, oriented to person, place, and time  Skin: Warm & dry  Cardiovascular: Normal heart rate noted  Respiratory: Normal respiratory effort, no distress  Abdomen: Soft, gravid, nontender  Pelvic: Cervical exam deferred         Extremities: Edema: None  Fetal Status:     Movement: Present    Results for orders placed or performed in visit on 06/19/18 (from the past 24 hour(s))  POC Urinalysis Dipstick OB   Collection Time: 06/19/18  8:54 AM  Result Value Ref Range   Color, UA     Clarity, UA     Glucose, UA Negative Negative   Bilirubin, UA     Ketones, UA neg    Spec Grav, UA     Blood, UA neg    pH, UA     POC,PROTEIN,UA Trace Negative, Trace, Small (1+), Moderate (2+), Large (3+), 4+   Urobilinogen, UA     Nitrite, UA neg    Leukocytes, UA Moderate (2+) (A)  Negative   Appearance     Odor      Assessment & Plan:  1) Low-risk pregnancy [id number] at [redacted]w[redacted]d with an Estimated Date of Delivery: 06/14/18   2) Post dates with non reactive NST today recommend IOL, discussed with Dr Vergie Living who agrees   Meds: No orders of the defined types were placed in this encounter.  Labs/procedures today: Non reactive NST  Plan:  Continue routine obstetrical care   Reviewed: Term labor symptoms and general obstetric precautions including but not limited to vaginal bleeding, contractions, leaking of fluid and fetal movement were reviewed in detail with the patient.  All questions were answered  Follow-up: Return in about 6 weeks (around 07/31/2018) for post partum visit.  Orders Placed This Encounter  Procedures  . POC Urinalysis Dipstick OB   Amaryllis Dyke Eure  06/19/2018 10:03 AM

## 2018-06-19 NOTE — MAU Note (Signed)
Pt here for induction for non-reactive NST.  Denies contractions, bleeding or LOF.  Reports good fetal movement.

## 2018-06-19 NOTE — Progress Notes (Signed)
Brittney Nicholson is a 28 y.o. [phone] at [redacted]w[redacted]d  admitted for induction of labor due to NRFHT.  Subjective: No complaints or concerns. Very engaged in care and asking appropriate questions about immediate newborn transition and possible interventions.  Objective: BP (!) 116/49   Pulse 86   Temp 97.9 F (36.6 C) (Oral)   Resp 18   Ht 5\' 7"  (1.702 m)   Wt 100.2 kg   LMP  (LMP Unknown)   BMI 34.61 kg/m  No intake/output data recorded. No intake/output data recorded.  FHT:  FHR: 130 bpm, variability: moderate,  accelerations:  Present,  decelerations:  Absent UC:   Rare, not felt by patient SVE:   Dilation: 4 Effacement (%): 60, 70 Station: -2 Exam by:: stone rnc  Labs: Lab Results  Component Value Date   WBC 8.0 06/19/2018   HGB 12.4 06/19/2018   HCT 39.3 06/19/2018   MCV 84.9 06/19/2018   PLT 167 06/19/2018    Assessment / Plan: Category I tracing since admission GBS +, adequately treated Occasional mild contractions, Pitocin started at 1745  Continue to titrate Pitocin, bridge to AROM in active labor Anticipate SVD   Calvert Cantor, CNM 06/19/2018, 6:16 PM

## 2018-06-19 NOTE — Anesthesia Pain Management Evaluation Note (Signed)
  CRNA Pain Management Visit Note  Patient: Brittney Nicholson, 28 y.o., female  "Hello I am a [facility]. We have an anesthesia team available at all times to provide care throughout the hospital, including epidural management and anesthesia for C-section. I don't know your plan for the delivery whether it a natural birth, water birth, IV sedation, nitrous supplementation, doula or epidural, but we want to meet your pain goals."   1.Was your pain managed to your expectations on prior hospitalizations?   Yes   2.What is your expectation for pain management during this hospitalization?     Epidural  3.How can we help you reach that goal?   Record the patient's initial score and the patient's pain goal.   Pain: 0  Pain Goal: 7 [facility] wants you to be able to say your pain was always managed very well.  Laban EmperorMalinova,Brittney Nicholson 06/19/2018

## 2018-06-19 NOTE — Anesthesia Procedure Notes (Signed)
Epidural Patient location during procedure: OB Start time: 06/19/2018 10:27 PM End time: 06/19/2018 10:30 PM  Staffing Anesthesiologist: Kaylyn LayerHowze, Brayan Votaw E, MD Performed: anesthesiologist   Preanesthetic Checklist Completed: patient identified, pre-op evaluation, timeout performed, IV checked, risks and benefits discussed and monitors and equipment checked  Epidural Patient position: sitting Prep: site prepped and draped and DuraPrep Patient monitoring: continuous pulse ox, blood pressure, heart rate and cardiac monitor Approach: midline Location: L3-L4 Injection technique: LOR air  Needle:  Needle type: Tuohy  Needle gauge: 17 G Needle length: 9 cm Needle insertion depth: 5 cm Catheter type: closed end flexible Catheter size: 19 Gauge Catheter at skin depth: 10 cm Test dose: negative and Other (1% lidocaine)  Assessment Events: blood not aspirated, injection not painful, no injection resistance, negative IV test and no paresthesia  Additional Notes Patient identified. Risks, benefits, and alternatives discussed with patient including but not limited to bleeding, infection, nerve damage, paralysis, failed block, incomplete pain control, headache, blood pressure changes, nausea, vomiting, reactions to medication, itching, and postpartum back pain. Confirmed with bedside nurse the patient's most recent platelet count. Confirmed with patient that they are not currently taking any anticoagulation, have any bleeding history, or any family history of bleeding disorders. Patient expressed understanding and wished to proceed. All questions were answered. Sterile technique was used throughout the entire procedure. Please see nursing notes for vital signs. Crisp LOR on first pass. Test dose was given through epidural catheter and negative prior to continuing to dose epidural or start infusion. Warning signs of high block given to the patient including shortness of breath, tingling/numbness in  hands, complete motor block, or any concerning symptoms with instructions to call for help. Patient was given instructions on fall risk and not to get out of bed. All questions and concerns addressed with instructions to call with any issues or inadequate analgesia.  Reason for block:procedure for pain

## 2018-06-19 NOTE — H&P (Addendum)
LABOR AND DELIVERY ADMISSION HISTORY AND PHYSICAL NOTE  Brittney Nicholson is a 28 y.o. female [phone] with IUP at 2811w5d by early ultrasound presenting for IOl for non-reactive NST.  She reports positive fetal movement. She denies leakage of fluid or vaginal bleeding.  Pren[facility] History/Complications[facility] Pregnancy complications:  - GBS + - cocaine, marijuna, opiate prior to pregnancy - subutex use  Past Medical History: Past Medical History:  Diagnosis Date  . Anxiety   . Depression   . 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 Headache    with this pregnancy  . HSV-2 infection complicating pregnancy   . Pneumonia   . Pregnant 12/13/2014  . Warts     Past Surgical History: Past Surgical History:  Procedure Laterality Date  . BREAST LUMPECTOMY  2006  . CHOLECYSTECTOMY N/A 01/25/2013   Procedure: LAPAROSCOPIC CHOLECYSTECTOMY;  Surgeon: Dalia HeadingMark A Jenkins, MD;  Location: [facility];  Service: General;  Laterality: N/A;  . right arm Right    has pins.  . TONSILLECTOMY    . TONSILLECTOMY  2011    Obstetrical History: OB History    Gravida  5   Para  2   Term  2   Preterm  0   AB  2   Living  2     SAB  2   TAB  0   Ectopic  0   Multiple  0   Live Births  2           Social History: Social History   Socioeconomic History  . Marital status: Single    Spouse name: Not on file  . Number of children: 2  . Years of education: Not on file  . Highest education level: Not on file  Occupational History  . Not on file  Social Needs  . Financial resource strain: Not on file  . Food insecurity:    Worry: Not on file    Inability: Not on file  . Transportation needs:    Medical: Not on file    Non-medical: Not on file  Tobacco Use  . Smoking status: Former Smoker    Packs/day: 1.00    Types: Cigarettes  . Smokeless tobacco: Never Used  . Tobacco comment: Quit September 2019  Substance and Sexual Activity  . Alcohol use: No  . Drug use: Not Currently     Types: Heroin    Comment: last use of heroin 24Jul 2019  . Sexual activity: Yes    Birth control/protection: None  Lifestyle  . Physical activity:    Days per week: Not on file    Minutes per session: Not on file  . Stress: Not on file  Relationships  . Social connections:    Talks on phone: Not on file    Gets together: Not on file    Attends religious service: Not on file    Active member of club or organization: Not on file    Attends meetings of clubs or organizations: Not on file    Relationship status: Not on file  Other Topics Concern  . Not on file  Social History Narrative   ** Merged History Encounter **        Family History: Family History  Problem Relation Age of Onset  . Cancer Mother        uterine  . Diabetes Father   . Kidney failure Father     Allergies: Allergies  Allergen Reactions  . Gluten Other (See  Comments)    RECTAL BLEEDING  . Wheat Other (See Comments)    Rectal bleeding    Medications Prior to Admission  Medication Sig Dispense Refill Last Dose  . acetaminophen (TYLENOL) 325 MG tablet Take 650 mg by mouth every 6 (six) hours as needed for mild pain.    Past Month at Unknown time  . buprenorphine (SUBUTEX) 8 MG SUBL SL tablet Place 4 mg under the tongue 3 (three) times daily.   06/19/2018 at 0900  . Prenatal Vit-Fe Fumarate-FA (PRENATAL VITAMIN PO) Take 1 tablet by mouth daily.    06/19/2018 at Unknown time     Review of Systems  All systems reviewed and negative except as stated in HPI  Physical Exam Blood pressure (!) 119/50, pulse 68, temperature 97.8 F (36.6 C), temperature source Oral, resp. rate 20, height 5\' 7"  (1.702 m), weight 100.2 kg, currently breastfeeding. General appearance: alert, oriented, NAD Lungs: normal respiratory effort Heart: regular rate Abdomen: soft, non-tender; gravid, FH appropriate for GA Extremities: No calf swelling or tenderness Presentation: cephalic Fetal monitoring: cat 2 Uterine activity:  inadequate contractions every 3-4 minutes, irregular Dilation: 4 Effacement (%): 70 Station: -2 Exam by:: stone rnc  Prenatal labs: ABO, Rh: --/--/O POS (01/10 1304) Antibody: PENDING (01/10 1304) Rubella: <0.90 (10/08 0909) RPR: Non Reactive (12/16 0925)  HBsAg: Negative (10/08 0909)  HIV: Non Reactive (12/16 0925)  GC/Chlamydia: neg GBS: Positive (12/16 1130)  1hour/fasting/2-hr GTT: 131/73/107 Genetic screening:  Not performed Anatomy US: normal. EFW 992g, 23% on 14 week anatomy  Prenatal Transfer Tool  Maternal Diabetes: No Genetic Screening: Declined Maternal Ultrasounds/Referrals: Normal Fetal Ultrasounds or other Referrals:  None Maternal Substance Abuse:  Yes:  Type: Marijuana, Cocaine, subutex Significant Maternal Medications:  Meds include: Other: subutex Significant Maternal Lab Results: None  Results for orders placed or performed during the hospital encounter of 06/19/18 (from the past 24 hour(s))  CBC   Collection Time: 06/19/18  1:04 PM  Result Value Ref Range   WBC 8.0 4.0 - 10.5 K/uL   RBC 4.63 3.87 - 5.11 MIL/uL   Hemoglobin 12.4 12.0 - 15.0 g/dL   HCT 70.4 88.8 - 91.6 %   MCV 84.9 80.0 - 100.0 fL   MCH 26.8 26.0 - 34.0 pg   MCHC 31.6 30.0 - 36.0 g/dL   RDW 94.5 03.8 - 88.2 %   Platelets 167 150 - 400 K/uL   nRBC 0.0 0.0 - 0.2 %  Type and screen [facility]   Collection Time: 06/19/18  1:04 PM  Result Value Ref Range   ABO/RH(D) O POS    Antibody Screen PENDING    Sample Expiration      06/22/2018 Performed at [facility], [address], [place], [place] [address]   Results for orders placed or performed in visit on 06/19/18 (from the past 24 hour(s))  POC Urinalysis Dipstick OB   Collection Time: 06/19/18  8:54 AM  Result Value Ref Range   Color, UA     Clarity, UA     Glucose, UA Negative Negative   Bilirubin, UA     Ketones, UA neg    Spec Grav, UA     Blood, UA neg    pH, UA     POC,PROTEIN,UA Trace  Negative, Trace, Small (1+), Moderate (2+), Large (3+), 4+   Urobilinogen, UA     Nitrite, UA neg    Leukocytes, UA Moderate (2+) (A) Negative   Appearance  Odor      Patient Active Problem List   Diagnosis Date Noted  . Non-reactive NST (non-stress test) 06/19/2018  . Pregnancy complicated by subutex maintenance, antepartum (HCC) 05/11/2018  . Rubella non-immune status, antepartum 03/19/2018  . Supervision of normal pregnancy 03/12/2018  . Opiate use 03/12/2018  . 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 Depression with anxiety 03/12/2018  . UTI (urinary tract infection) during pregnancy, second trimester 03/12/2018  . 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 Acute cholecystitis 01/25/2013  . Warts 08/18/2012    Assessment: Brittney CromerHeather E Shively is a 28 y.o. [id number] at 2918w5d here for IOL for NRFHT. Strip has looked good since admission. 4cm on exam. Given multip status will give PCN and manage expectantly for now. Can augment after adequately treated for GBS.  #Labor: expectant for now, likely pitocin after adequate gbs ppx #Pain: epidural #FWB: Cat 2 #ID:  gbs + -> pcn #MOF: breast #MOC: IUD #Circ:  N/A  Myrene BuddyJacob Fletcher MD PGY-2 Family Medicine Resident 06/19/2018, 2:24 PM   I confirm that I have verified the information documented in the resident's note and that I have also personally reperformed the physical exam and all medical decision making activities.  Clayton BiblesSamantha Louis Ivery, CNM 06/19/18  3:15 PM

## 2018-06-19 NOTE — Anesthesia Preprocedure Evaluation (Signed)
Anesthesia Evaluation  Patient identified by MRN, date of birth, ID band Patient awake    Reviewed: Allergy & Precautions, Patient's Chart, lab work & pertinent test results  History of Anesthesia Complications Negative for: history of anesthetic complications  Airway Mallampati: II  TM Distance: >3 FB Neck ROM: Full    Dental  (+) Teeth Intact   Pulmonary former smoker,    Pulmonary exam normal breath sounds clear to auscultation       Cardiovascular negative cardio ROS Normal cardiovascular exam Rhythm:Regular Rate:Normal     Neuro/Psych  Headaches, Anxiety Depression negative psych ROS   GI/Hepatic negative GI ROS, (+)     substance abuse (Hx of cocaine/opioid/marijuana abuse prior to pregnancy, now on Subutex)  ,   Endo/Other  negative endocrine ROS  Renal/GU negative Renal ROS  negative genitourinary   Musculoskeletal negative musculoskeletal ROS (+)   Abdominal   Peds negative pediatric ROS (+)  Hematology negative hematology ROS (+)   Anesthesia Other Findings   Reproductive/Obstetrics (+) Pregnancy                             Anesthesia Physical Anesthesia Plan  ASA: II  Anesthesia Plan: Epidural   Post-op Pain Management:    Induction:   PONV Risk Score and Plan: Treatment may vary due to age or medical condition  Airway Management Planned: Natural Airway  Additional Equipment:   Intra-op Plan:   Post-operative Plan:   Informed Consent: I have reviewed the patients History and Physical, chart, labs and discussed the procedure including the risks, benefits and alternatives for the proposed anesthesia with the patient or authorized representative who has indicated his/her understanding and acceptance.     Plan Discussed with: CRNA  Anesthesia Plan Comments:         Anesthesia Quick Evaluation

## 2018-06-20 DIAGNOSIS — O99824 Streptococcus B carrier state complicating childbirth: Secondary | ICD-10-CM

## 2018-06-20 DIAGNOSIS — O480 Post-term pregnancy: Secondary | ICD-10-CM

## 2018-06-20 DIAGNOSIS — Z3A40 40 weeks gestation of pregnancy: Secondary | ICD-10-CM

## 2018-06-20 LAB — RAPID URINE DRUG SCREEN, HOSP PERFORMED
Amphetamines: NOT DETECTED
Barbiturates: NOT DETECTED
Benzodiazepines: NOT DETECTED
Cocaine: NOT DETECTED
OPIATES: NOT DETECTED
Tetrahydrocannabinol: NOT DETECTED

## 2018-06-20 LAB — RPR: RPR Ser Ql: NONREACTIVE

## 2018-06-20 MED ORDER — PRENATAL MULTIVITAMIN CH
1.0000 | ORAL_TABLET | Freq: Every day | ORAL | Status: DC
  Administered 2018-06-20 – 2018-06-22 (×3): 1 via ORAL
  Filled 2018-06-20 (×3): qty 1

## 2018-06-20 MED ORDER — DIBUCAINE 1 % RE OINT: 1.0000 "application " | TOPICAL_OINTMENT | RECTAL | Status: DC | PRN

## 2018-06-20 MED ORDER — SENNOSIDES-DOCUSATE SODIUM 8.6-50 MG PO TABS
2.0000 | ORAL_TABLET | ORAL | Status: DC
  Administered 2018-06-20 – 2018-06-21 (×2): 2 via ORAL
  Filled 2018-06-20 (×2): qty 2

## 2018-06-20 MED ORDER — BENZOCAINE-MENTHOL 20-0.5 % EX AERO
1.0000 "application " | INHALATION_SPRAY | CUTANEOUS | Status: DC | PRN
  Administered 2018-06-20: 1 via TOPICAL
  Filled 2018-06-20: qty 56

## 2018-06-20 MED ORDER — ERYTHROMYCIN 5 MG/GM OP OINT
TOPICAL_OINTMENT | OPHTHALMIC | Status: AC
  Filled 2018-06-20: qty 1

## 2018-06-20 MED ORDER — ACETAMINOPHEN 325 MG PO TABS: 650.0000 mg | ORAL_TABLET | ORAL | Status: DC | PRN

## 2018-06-20 MED ORDER — COCONUT OIL OIL
1.0000 "application " | TOPICAL_OIL | Status: DC | PRN
  Administered 2018-06-22: 1 via TOPICAL
  Filled 2018-06-20: qty 120

## 2018-06-20 MED ORDER — MEASLES, MUMPS & RUBELLA VAC IJ SOLR
0.5000 mL | Freq: Once | INTRAMUSCULAR | Status: DC
  Filled 2018-06-20: qty 0.5

## 2018-06-20 MED ORDER — SIMETHICONE 80 MG PO CHEW: 80.0000 mg | CHEWABLE_TABLET | ORAL | Status: DC | PRN

## 2018-06-20 MED ORDER — ZOLPIDEM TARTRATE 5 MG PO TABS: 5.0000 mg | ORAL_TABLET | Freq: Every evening | ORAL | Status: DC | PRN

## 2018-06-20 MED ORDER — ONDANSETRON HCL 4 MG PO TABS: 4.0000 mg | ORAL_TABLET | ORAL | Status: DC | PRN

## 2018-06-20 MED ORDER — WITCH HAZEL-GLYCERIN EX PADS: 1.0000 "application " | MEDICATED_PAD | CUTANEOUS | Status: DC | PRN

## 2018-06-20 MED ORDER — ONDANSETRON HCL 4 MG/2ML IJ SOLN: 4.0000 mg | INTRAMUSCULAR | Status: DC | PRN

## 2018-06-20 MED ORDER — IBUPROFEN 600 MG PO TABS
600.0000 mg | ORAL_TABLET | Freq: Four times a day (QID) | ORAL | Status: DC
  Administered 2018-06-20 – 2018-06-22 (×10): 600 mg via ORAL
  Filled 2018-06-20 (×10): qty 1

## 2018-06-20 MED ORDER — TETANUS-DIPHTH-ACELL PERTUSSIS 5-2.5-18.5 LF-MCG/0.5 IM SUSP: 0.5000 mL | Freq: Once | INTRAMUSCULAR | Status: DC

## 2018-06-20 MED ORDER — DIPHENHYDRAMINE HCL 25 MG PO CAPS: 25.0000 mg | ORAL_CAPSULE | Freq: Four times a day (QID) | ORAL | Status: DC | PRN

## 2018-06-20 MED ORDER — BUPIVACAINE HCL (PF) 0.25 % IJ SOLN
INTRAMUSCULAR | Status: DC | PRN
  Administered 2018-06-20: 8 mL via EPIDURAL

## 2018-06-20 MED ORDER — BUPRENORPHINE HCL 2 MG SL SUBL
4.0000 mg | SUBLINGUAL_TABLET | Freq: Three times a day (TID) | SUBLINGUAL | Status: DC
  Administered 2018-06-20 – 2018-06-22 (×8): 4 mg via SUBLINGUAL
  Filled 2018-06-20 (×11): qty 2

## 2018-06-20 NOTE — Anesthesia Postprocedure Evaluation (Signed)
Anesthesia Post Note  Patient: Brittney Nicholson  Procedure(s) Performed: AN AD HOC LABOR EPIDURAL     Patient location during evaluation: Mother Baby Anesthesia Type: Epidural Level of consciousness: awake and alert, oriented and patient cooperative Pain management: pain level controlled Vital Signs Assessment: post-procedure vital signs reviewed and stable Respiratory status: spontaneous breathing Cardiovascular status: stable Postop Assessment: no headache, epidural receding, patient able to bend at knees and no signs of nausea or vomiting Anesthetic complications: no Comments: Pain score 0.      Last Vitals:  Vitals:   06/20/18 0306 06/20/18 0413  BP: 102/61 (!) 99/53  Pulse: 66 67  Resp: 18 18  Temp: 37 C 37 C    Last Pain:  Vitals:   06/20/18 0413  TempSrc: Oral  PainSc:    Pain Goal:                 [facility]

## 2018-06-20 NOTE — Discharge Summary (Addendum)
Postpartum Discharge Summary     Patient Name: Brittney Nicholson DOB: 1990/07/01 MRN: [id number]  Date of admission: 06/19/2018 Delivering Provider: Griselda Miner   Date of discharge: 06/22/2018  Admitting diagnosis: 40WKS INDUCTION Intrauterine pregnancy: [redacted]w[redacted]d     Secondary diagnosis:  Active Problems:   Genital HSV   Opiate use   Heroin use   Depression with anxiety   Rubella non-immune status, antepartum   Pregnancy complicated by subutex maintenance, antepartum (HCC)   Non-reactive NST (non-stress test)  Additional problems: GBS pos     Discharge diagnosis: Term Pregnancy Delivered                                                                                              Post partum procedures: none  Augmentation: Pitocin  Complications: None  Hospital course:  Induction of Labor With Vaginal Delivery   28 y.o. yo [id number] at [redacted]w[redacted]d was admitted to the hospital 06/19/2018 for induction of labor.  Indication for induction: nonreactive NST.  Patient had an uncomplicated labor course as follows: Patient admitted on 1/10 for IOL for NRFHT. Patient was 4cm on exam. She was initially expectantly managed given that she was gbs + and wanted to ensure adequate ppx prior to induction. Once she was adequate she was started on pitocin. Patient progressed quickly and she had a routine delivery in the early AM of 1/11. She had a routine post-delivery course with no complications.  Patient had a history of opioid use disorder. She received her home dose of subutex during admission. She was discharged on 1/13.   Membrane Rupture Time/Date: 10:00 PM ,06/19/2018   Intrapartum Procedures: Episiotomy: None [1]                                         Lacerations:  None [1]  Patient had delivery of a Viable infant.  Information for the patient's newborn:  Weston, Loaiza [[id number]]  Delivery Method: Vag-Spont   06/20/2018  Details of delivery can be found in separate  delivery note.  Patient had a routine postpartum course. Patient is discharged home 06/22/18.  Magnesium Sulfate recieved:no BMZ received: No  Physical exam  Vitals:   06/21/18 0522 06/21/18 1442 06/21/18 2134 06/22/18 0500  BP: (!) 109/6 (!) 96/53 104/62 113/66  Pulse: 69 76 92 91  Resp: 18 16  20   Temp: 98 F (36.7 C) 98.5 F (36.9 C) 98.9 F (37.2 C) 98.1 F (36.7 C)  TempSrc: Oral Oral Oral Oral  SpO2:  98% 99% 100%  Weight:      Height:       General: alert, cooperative and no distress Lochia: appropriate Uterine Fundus: firm Incision: N/A DVT Evaluation: No evidence of DVT seen on physical exam. Negative Homan's sign. No cords or calf tenderness. Labs: Lab Results  Component Value Date   WBC 8.0 06/19/2018   HGB 12.4 06/19/2018   HCT 39.3 06/19/2018   MCV 84.9 06/19/2018   PLT 167 06/19/2018   CMP Latest  Ref Rng & Units 02/18/2018  Glucose 70 - 99 mg/dL 89  BUN 6 - 20 mg/dL 10  Creatinine 9.140.44 - 7.821.00 mg/dL 9.56(O0.43(L)  Sodium 130135 - 865145 mmol/L 131(L)  Potassium 3.5 - 5.1 mmol/L 4.7  Chloride 98 - 111 mmol/L 96(L)  CO2 22 - 32 mmol/L 24  Calcium 8.9 - 10.3 mg/dL 9.1  Total Protein 6.5 - 8.1 g/dL 5.9(L)  Total Bilirubin 0.3 - 1.2 mg/dL 0.4  Alkaline Phos 38 - 126 U/L 49  AST 15 - 41 U/L 17  ALT 0 - 44 U/L 9    Discharge instruction: per After Visit Summary and "Baby and Me Booklet".  After visit meds:  Allergies as of 06/22/2018      Reactions   Gluten Other (See Comments)   RECTAL BLEEDING   Wheat Other (See Comments)   Rectal bleeding      Medication List    STOP taking these medications   acetaminophen 325 MG tablet Commonly known as:  TYLENOL   PRENATAL VITAMIN PO     TAKE these medications   buprenorphine 8 MG Subl SL tablet Commonly known as:  SUBUTEX Place 4 mg under the tongue 3 (three) times daily.   ibuprofen 600 MG tablet Commonly known as:  ADVIL,MOTRIN Take 1 tablet (600 mg total) by mouth every 6 (six) hours.       Diet:  routine diet  Activity: Advance as tolerated. Pelvic rest for 6 weeks.   Outpatient follow up:4 weeks Follow up Appt:No future appointments. Follow up Visit: Follow-up Information    [facility] Follow up.   Specialty:  Obstetrics and Gynecology Why:  Please call as soon as possible to schedule follow up in 4-6 weeks Contact information: [address] [place] [phone]           Please schedule this patient for Postpartum visit in: 4 weeks with the following provider: Any provider For C/S patients schedule nurse incision check in weeks 2 weeks: no High risk pregnancy complicated by: subutex use  Delivery mode:  SVD Anticipated Birth Control:  IUD PP Procedures needed: none  Schedule Integrated BH visit: no       Newborn Data: Live born female  Birth Weight: 6 lb 12.3 oz (3070 g) APGAR: 9, 9  Newborn Delivery   Birth date/time:  06/20/2018 01:10:00 Delivery type:  Vaginal, Spontaneous     Baby Feeding: Breast Disposition:home with mother   06/22/2018 Myrene BuddyJacob Fletcher, MD  OB FELLOW DISCHARGE ATTESTATION  I have seen and examined this patient and agree with above documentation in the resident's note.   Gwenevere AbbotNimeka Yarianna Varble, MD  OB Fellow  06/22/2018, 9:25 AM

## 2018-06-20 NOTE — Lactation Note (Addendum)
This note was copied from a baby's chart. Lactation Consultation Note  Patient Name: Girl Marshawna Lippitt TZGYF'V Date: 06/20/2018   P3, Baby 10 hours old.  Mother breastfed her last child, not her first.  Mother taking subutex. Mother can hand express good flow of colostrum. Assisted w/ latching baby with and without #24NS and baby latches well, sucks and few times and falls asleep. Mother is pumping w/ DEBP and giving volume to baby with finger curved tip syringe. Baby is tongue thrusting and has mid anterior lingual tightness.  Suggest discussing w/ Peds MD. Discussed pumping and giving breastmilk to baby on demand at least q 3hours, STS and continuing working on latch. Reviewed milk storage. Feed on demand approximately 8-12 times per day.   Mom given lactation brochure with resources.  Mom has my # to call for assist w/next feeding.         Maternal Data    Feeding    LATCH Score                   Interventions    Lactation Tools Discussed/Used     Consult Status      Dahlia Byes [facility] 06/20/2018, 11:25 AM

## 2018-06-21 ENCOUNTER — Encounter (HOSPITAL_COMMUNITY): Payer: Self-pay | Admitting: Family Medicine

## 2018-06-21 MED ORDER — MEASLES, MUMPS & RUBELLA VAC IJ SOLR
0.5000 mL | Freq: Once | INTRAMUSCULAR | Status: AC
  Administered 2018-06-21: 0.5 mL via SUBCUTANEOUS
  Filled 2018-06-21 (×2): qty 0.5

## 2018-06-21 NOTE — Progress Notes (Signed)
Post Partum Day 1 Subjective: no complaints, up ad lib, voiding and tolerating PO  Objective: Blood pressure 104/62, pulse 92, temperature 98.9 F (37.2 C), temperature source Oral, resp. rate 16, height 5\' 7"  (1.702 m), weight 100.2 kg, SpO2 99 %, currently breastfeeding.  Physical Exam:  General: alert, cooperative and no distress Lochia: appropriate Uterine Fundus: firm Incision: n/a DVT Evaluation: No evidence of DVT seen on physical exam.  Recent Labs    06/19/18 1304  HGB 12.4  HCT 39.3    Assessment/Plan: Plan for discharge tomorrow   LOS: 2 days   Rolm Bookbinder 06/21/2018, 10:12 PM

## 2018-06-21 NOTE — Clinical Social Work Maternal (Signed)
CLINICAL SOCIAL WORK MATERNAL/CHILD NOTE  Patient Details  Name: Brittney Nicholson MRN: [id number] Date of Birth: 03/15/1991  Date:  06/21/2018  Clinical Social Worker Initiating Note:   [place] Date/Time: Initiated:  06/21/18/1234     Child's Name:  Brittney Nicholson   Biological Parents:  Mother, Father   Need for Interpreter:  None   Reason for Referral:  Behavioral Health Concerns, Current Substance Use/Substance Use During Pregnancy    Address:  [address] [place] [address]    Phone number:  [phone] (home)     Additional phone number:   Household Members/Support Persons (HM/SP):   Household Member/Support Person 1, Household Member/Support Person 2, Household Member/Support Person 3(MOB reported MOB has 2 older children that are currently in kinship placement at the decision of CPS. )   HM/SP Name Relationship DOB or Age  HM/SP -1 Phillip Nicholson FOB 11/`17/1993  HM/SP -2 Joni Butler daughter 11/08/2012  HM/SP -3 Aubrey Nicholson daughter 07/28/15  HM/SP -4        HM/SP -5        HM/SP -6        HM/SP -7        HM/SP -8          Natural Supports (not living in the home):  Immediate Family, Parent, Extended Family   Professional Supports: Case Manager/Social Worker(MOB has an open CPS case with [place] CPS (CPS worker is Ginger Branscum).)   Employment: Unemployed   Type of Work:     Education:  Some College   Homebound arranged:    Financial Resources:  Medicaid   Other Resources:  (CSW provided MOB with information to apply for WIC and Food Stamps. )   Cultural/Religious Considerations Which May Impact Care:  Per MOB's Face Sheet MOB is Non Denominational.  Strengths:  Ability to meet basic needs , Home prepared for child    Psychotropic Medications:         Pediatrician:       Pediatrician List:   [place]    [place]    [place]    [place]    [place]    [place]      Pediatrician  Fax Number:    Risk Factors/Current Problems:  Mental Health Concerns , Substance Use , DHHS Involvement    Cognitive State:  Able to Concentrate , Alert , Linear Thinking , Insightful , Goal Oriented    Mood/Affect:  Interested , Happy , Relaxed , Bright , Comfortable    CSW Assessment: CSW met with MOB in room 145 to complete an assessment for hx of substance abuse in pregnancy, hx of anxiety, and MOB currently taking Subutex.  When CSW arrived, MOB was bonding with infant as evidence by holding infant; MOB and infant appeared comfortable. FOB was also present and with MOB's permission, CSW asked FOB to leave the room in order to meet with MOB in private. MOB was polite, easy to engage, and interested in meeting with CSW.   CSW asked about MOB's MH hx and MOB acknowledged a hx of anxiety/depression and PPD .  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  MOB communicated being tearful most day doubting herself as a mom, and feeling bad.  MOB communicated that MOB did contact her OB provider and was prescribed Zoloft. MOB and reported that MOB and OB are being proactive and MOB will receive a Rx for Zoloft after discharge. CSW provided education regarding   the baby blues period vs. perinatal mood disorders, discussed treatment and gave resources for mental health follow up if concerns arise.  CSW recommends self-evaluation during the postpartum time period using the New Mom Checklist from Postpartum Progress and encouraged MOB to contact a medical professional if symptoms are noted at any time.  MOB presented with insight and awareness and expressed feeling comfortable seeking help if warranted. CSW assessed for safety and MOB denied SI, HI, and DV.    CSW asked about MOB's SA hx and MOB openly shared her struggle with illicit substance.  MOB reported that she is currently prescribed Subutex (Managed by [facility])and communicated that the medication is managing her symptoms.  MOB  also reported that MOB has an open case with [facility] CPS due to MOB's inability to parent while in active addiction.  Per MOB, MOB's last use of heroin was 04/08/2018. CSW congratulated MOB on her sobriety and encouraged her to continue. CSW informed MOB of the hospital's drug screen policy. CSW was made aware of the 2 drug screenings for the infant.  MOB was understanding and did not have any concerns. CSW informed MOB that the infant's UDS negative and that CSW will continue to monitor infant's CSW.  MOB is aware that CSW will report results to CPS.  CSW also made MOB aware that CSW will make a report to [facility] CPS due to MOB currently having an open case.  MOB was understanding and expressed, "I was anticipating it."  MOB reported having all essential items for infant and feeling prepared to parent.   CSW Plan/Description:  No Further Intervention Required/No Barriers to Discharge, Sudden Infant Death Syndrome (SIDS) Education, Perinatal Mood and Anxiety Disorder (PMADs) Education, Neonatal Abstinence Syndrome (NAS) Education, Other Patient/Family Education, [place], Child Protective Service Report , CSW Will Continue to Monitor Umbilical Cord Tissue Drug Screen Results and Make Report if Warranted   Laurey Arrow, MSW, LCSW Clinical Social Work [phone]   Dimple Nanas, LCSW 06/21/2018, 12:49 PM

## 2018-06-22 MED ORDER — IBUPROFEN 600 MG PO TABS: 600.0000 mg | ORAL_TABLET | Freq: Four times a day (QID) | ORAL | 0 refills | Status: DC

## 2018-06-22 NOTE — Progress Notes (Signed)
CSW received return call from [place] DSS CPS worker (Esmerlda Wyatt) and informed that she would be meeting with MOB today or tomorrow morning to determine discharge plan for infant.   CSW awaiting return call from CPS worker with infant's discharge plan. Currently there are barriers to infant's discharge.   Jaheem Hedgepath, LCSWA Clinical Social Worker [facility] Cell#: [phone] 

## 2018-06-22 NOTE — Progress Notes (Signed)
CSW contacted [facility] Department of Social Services and made a CPS report to [facility]) due to MOB's current open CPS case. Intake Worker reported that she needed to speak with a supervisor about MOB's case and agreed to contact CSW with an update.   Currently there are barriers to discharge. CSW awaiting return call from [facility] DSS CPS.   Celso Sickle, LCSWA Clinical Social Worker [facility] Cell#: [phone]

## 2018-06-23 ENCOUNTER — Telehealth: Payer: Self-pay | Admitting: *Deleted

## 2018-06-23 NOTE — Telephone Encounter (Signed)
Lmom for pt to call us back to set up pp appointment.  06-23-2018  AS

## 2018-07-20 ENCOUNTER — Telehealth: Payer: Self-pay | Admitting: Women's Health

## 2018-07-20 NOTE — Telephone Encounter (Signed)
Patient called, stated that her breast milk stinks &  breast hurt (feels like needles all over).  [phone]

## 2018-07-20 NOTE — Telephone Encounter (Signed)
Called patient back and heard recording that patient is not available and no voicemail set up.

## 2018-07-28 ENCOUNTER — Ambulatory Visit: Payer: Medicaid Other | Admitting: Women's Health

## 2018-07-30 ENCOUNTER — Encounter: Payer: Self-pay | Admitting: *Deleted

## 2018-07-30 ENCOUNTER — Ambulatory Visit: Payer: Medicaid Other | Admitting: Women's Health

## 2018-08-16 ENCOUNTER — Other Ambulatory Visit: Payer: Self-pay

## 2018-08-16 ENCOUNTER — Encounter (HOSPITAL_COMMUNITY): Payer: Self-pay | Admitting: Emergency Medicine

## 2018-08-16 ENCOUNTER — Emergency Department (HOSPITAL_COMMUNITY)
Admission: EM | Admit: 2018-08-16 | Discharge: 2018-08-16 | Discharge status: Against Medical Advice | Payer: Medicaid Other | Attending: Emergency Medicine | Admitting: Emergency Medicine

## 2018-08-16 DIAGNOSIS — Z87891 Personal history of nicotine dependence: Secondary | ICD-10-CM | POA: Insufficient documentation

## 2018-08-16 DIAGNOSIS — Z79899 Other long term (current) drug therapy: Secondary | ICD-10-CM | POA: Diagnosis not present

## 2018-08-16 DIAGNOSIS — R55 Syncope and collapse: Secondary | ICD-10-CM | POA: Insufficient documentation

## 2018-08-16 DIAGNOSIS — F1190 Opioid use, unspecified, uncomplicated: Secondary | ICD-10-CM | POA: Diagnosis not present

## 2018-08-16 DIAGNOSIS — Z5329 Procedure and treatment not carried out because of patient's decision for other reasons: Secondary | ICD-10-CM | POA: Diagnosis not present

## 2018-08-16 HISTORY — DX: Syncope and collapse: R55

## 2018-08-16 HISTORY — DX: Opioid use, unspecified, uncomplicated: F11.90

## 2018-08-16 NOTE — ED Provider Notes (Signed)
[facility] EMERGENCY DEPARTMENT Provider Note   CSN: [id number] Arrival date & time: 08/16/18  1455    History   Chief Complaint Chief Complaint  Patient presents with  . Loss of Consciousness    HPI Brittney Nicholson is a 28 y.o. female.     HPI  Pt was seen at 1505. Per EMS, Police and pt: Pt brought to ED by EMS for possible OD. EMS was called by pt's friend after pt became "unconscious." Pt states she remembers "going to [facility]" then does not recall events. EMS states pt's O2 Sats were "78%" on scene and a bystander performed chest compressions for unknown period of time. EMS gave narcan 1mg  with improvement in mental status. Pt states she took her usual suboxone this morning "but nothing else." States she ran out of her suboxone and is to go to the pharmacy to get her prescription, but then states she "skipped a few days and had some." Not forthcoming regarding how much suboxone she took or what time. Pt has hx of opiate abuse. Pt states she "didn't want to come here," is refusing all interventions, and "just wants to leave now."  Denies SI/SA, no HI, no AVH. Pt denies CP/SOB, no abd pain, no neck or back pain.     Past Medical History:  Diagnosis Date  . Anxiety   . Depression   . 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 Headache    with this pregnancy  . HSV-2 infection complicating pregnancy   . Opiate use   . Pneumonia   . Pregnant 12/13/2014  . Syncope   . Warts     Patient Active Problem List   Diagnosis Date Noted  . Non-reactive NST (non-stress test) 06/19/2018  . Pregnancy complicated by subutex maintenance, antepartum (HCC) 05/11/2018  . Rubella non-immune status, antepartum 03/19/2018  . Opiate use 03/12/2018  . 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 Depression with anxiety 03/12/2018  . UTI (urinary tract infection) during pregnancy, second trimester 03/12/2018  . 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 Acute cholecystitis 01/25/2013  . Warts 08/18/2012    Past  Surgical History:  Procedure Laterality Date  . BREAST LUMPECTOMY  2006  . CHOLECYSTECTOMY N/A 01/25/2013   Procedure: LAPAROSCOPIC CHOLECYSTECTOMY;  Surgeon: Dalia Heading, MD;  Location: [facility];  Service: General;  Laterality: N/A;  . right arm Right    has pins.  . TONSILLECTOMY    . TONSILLECTOMY  2011     OB History    Gravida  5   Para  3   Term  3   Preterm  0   AB  2   Living  3     SAB  2   TAB  0   Ectopic  0   Multiple  0   Live Births  3            Home Medications    Prior to Admission medications   Medication Sig Start Date End Date Taking? Authorizing Provider  buprenorphine (SUBUTEX) 8 MG SUBL SL tablet Place 4 mg under the tongue 3 (three) times daily.    [provider]  ibuprofen (ADVIL,MOTRIN) 600 MG tablet Take 1 tablet (600 mg total) by mouth every 6 (six) hours. 06/22/18   Myrene Buddy, MD    Family History Family History  Problem Relation Age of Onset  . Cancer Mother        uterine  . Diabetes Father   . Kidney  failure Father     Social History Social History   Tobacco Use  . Smoking status: Former Smoker    Packs/day: 1.00    Types: Cigarettes  . Smokeless tobacco: Never Used  . Tobacco comment: Quit September 2019  Substance Use Topics  . Alcohol use: No  . Drug use: Not Currently    Types: Heroin    Comment: last use of heroin 24Jul 2019     Allergies   Gluten and Wheat   Review of Systems Review of Systems ROS: Statement: All systems negative except as marked or noted in the HPI; Constitutional: Negative for fever and chills. ; ; Eyes: Negative for eye pain, redness and discharge. ; ; ENMT: Negative for ear pain, hoarseness, nasal congestion, sinus pressure and sore throat. ; ; Cardiovascular: Negative for chest pain, palpitations, diaphoresis, dyspnea and peripheral edema. ; ; Respiratory: Negative for cough, wheezing and stridor. ; ; Gastrointestinal: Negative for nausea, vomiting, diarrhea,  abdominal pain, blood in stool, hematemesis, jaundice and rectal bleeding. . ; ; Genitourinary: Negative for dysuria, flank pain and hematuria. ; ; Musculoskeletal: Negative for back pain and neck pain. Negative for swelling and trauma.; ; Skin: Negative for pruritus, rash, abrasions, blisters, bruising and skin lesion.; ; Neuro: Negative for headache, lightheadedness and neck stiffness. Negative for weakness, extremity weakness, paresthesias, involuntary movement, seizure and +syncope.;; Psych:  No SI, no SA, no HI, no hallucinations.    Physical Exam Updated Vital Signs BP 112/60 (BP Location: Right Arm)   Pulse 88   Temp 98.2 F (36.8 C) (Oral)   Resp 16   Ht 5\' 7"  (1.702 m)   Wt 89.8 kg   SpO2 100%   BMI 31.01 kg/m   Physical Exam 1510: Physical examination:  Nursing notes reviewed; Vital signs and O2 SAT reviewed;  Constitutional: Well developed, Well nourished, Well hydrated, In no acute distress; Head:  Normocephalic, atraumatic; Eyes: EOMI, PERRL, No scleral icterus; ENMT: Mouth and pharynx normal, Mucous membranes moist; Neck: Supple, Full range of motion; Cardiovascular: Regular rate and rhythm; Respiratory: Breath sounds clear, No wheezes.  Speaking full sentences with ease, Normal respiratory effort/excursion; Chest: No deformity, Movement normal; Abdomen: Nondistended; Extremities: No deformity. +Left ankle monitor.; Neuro: AA&Ox3, Major CN grossly intact.  Speech clear. No gross focal motor deficits in extremities. Climbs on and off stretcher easily by herself. Gait steady.; Skin: Color normal, Warm, Dry.; Psych:  Affect full. Denies SI.     ED Treatments / Results  Labs (all labs ordered are listed, but only abnormal results are displayed)   EKG EKG Interpretation  Date/Time:  Sunday August 16 2018 15:06:43 EDT Ventricular Rate:  99 PR Interval:    QRS Duration: 99 QT Interval:  377 QTC Calculation: 484 R Axis:   76 Text Interpretation:  Sinus rhythm Borderline  prolonged QT interval Baseline wander Artifact When compared with ECG of 02/18/2018 No significant change was found Confirmed by Samuel Jester [phone]) on 08/16/2018 3:16:16 PM   Radiology   Procedures Procedures (including critical care time)  Medications Ordered in ED Medications - No data to display   Initial Impression / Assessment and Plan / ED Course  I have reviewed the triage vital signs and the nursing notes.  Pertinent labs & imaging results that were available during my care of the patient were reviewed by me and considered in my medical decision making (see chart for details).     MDM Reviewed: previous chart, nursing note and vitals Reviewed previous: ECG  Interpretation: ECG   1515:  Pt refuses any ED interventions and wants to leave now. Pt has climbed off the stretcher and gotten herself dressed. Pt absolutely denies SI/SA. ED RN x2 and I encouraged pt to stay, continues to refuse.  Pt makes her own medical decisions.  Risks of AMA explained to pt, including, but not limited to:  stroke, heart attack, cardiac arrythmia ("irregular heart rate/beat"), "passing out," temporary and/or permanent disability, death.  Pt verb understanding and continue to refuse any interventions, understanding the consequences of her decision.  I encouraged pt to follow up with her PMD tomorrow and return to the ED immediately if symptoms return, or for any other concerns.  Pt verb understanding, agreeable. Pt walked out of the ED with steady gait, easy resps, NAD.      Final Clinical Impressions(s) / ED Diagnoses   Final diagnoses:  Syncope and collapse  Opiate use    ED Discharge Orders    None       Samuel Jester, DO 08/20/18 2214

## 2018-08-16 NOTE — ED Triage Notes (Signed)
Patient brought in via EMS. Alert and oriented. Airway patent. Patient brought in for possible overdose. EMS called today by patient's friend. Per friend patient became unconscious and cyanotic. Per paramedics patient's O2 sat 78% upon rescuers arrival. Patient had chest compressions per paramedic for unknown amount of time. Denies any chest pain. Patient had 1mg  of Narcan and became responsive. Patient was confused to what has happened but alert and oriented to everything else. Per patient's brother patient uses heroin but patient's denies taking any since October. EKG per paramedic NS. Patient denies any SI. Patient reports she has had cough and "not feeling well for past couple of days."

## 2018-08-16 NOTE — ED Notes (Signed)
Pt denies being SI pt aOx3 pt states that she wants to leave [facility]

## 2018-08-19 ENCOUNTER — Emergency Department (HOSPITAL_COMMUNITY)
Admission: EM | Admit: 2018-08-19 | Discharge: 2018-08-19 | Discharge status: Against Medical Advice | Payer: Medicaid Other

## 2018-10-24 ENCOUNTER — Other Ambulatory Visit: Payer: Self-pay

## 2018-10-24 ENCOUNTER — Encounter (HOSPITAL_COMMUNITY): Payer: Self-pay | Admitting: *Deleted

## 2018-10-24 ENCOUNTER — Emergency Department (HOSPITAL_COMMUNITY)
Admission: EM | Admit: 2018-10-24 | Discharge: 2018-10-24 | Disposition: A | Discharge status: Home / Self Care | Payer: Medicaid Other | Attending: Emergency Medicine | Admitting: Emergency Medicine

## 2018-10-24 ENCOUNTER — Emergency Department (HOSPITAL_COMMUNITY): Payer: Medicaid Other

## 2018-10-24 DIAGNOSIS — R1011 Right upper quadrant pain: Secondary | ICD-10-CM | POA: Insufficient documentation

## 2018-10-24 DIAGNOSIS — Z79899 Other long term (current) drug therapy: Secondary | ICD-10-CM | POA: Diagnosis not present

## 2018-10-24 DIAGNOSIS — Z87891 Personal history of nicotine dependence: Secondary | ICD-10-CM | POA: Diagnosis not present

## 2018-10-24 DIAGNOSIS — R109 Unspecified abdominal pain: Secondary | ICD-10-CM

## 2018-10-24 LAB — CBC WITH DIFFERENTIAL/PLATELET
Abs Immature Granulocytes: 0.01 10*3/uL (ref 0.00–0.07)
Basophils Absolute: 0 10*3/uL (ref 0.0–0.1)
Basophils Relative: 1 %
Eosinophils Absolute: 0.2 10*3/uL (ref 0.0–0.5)
Eosinophils Relative: 3 %
HCT: 36.2 % (ref 36.0–46.0)
Hemoglobin: 11.6 g/dL — ABNORMAL LOW (ref 12.0–15.0)
Immature Granulocytes: 0 %
Lymphocytes Relative: 34 %
Lymphs Abs: 2 10*3/uL (ref 0.7–4.0)
MCH: 26.9 pg (ref 26.0–34.0)
MCHC: 32 g/dL (ref 30.0–36.0)
MCV: 84 fL (ref 80.0–100.0)
Monocytes Absolute: 0.4 10*3/uL (ref 0.1–1.0)
Monocytes Relative: 8 %
Neutro Abs: 3.1 10*3/uL (ref 1.7–7.7)
Neutrophils Relative %: 54 %
Platelets: 146 10*3/uL — ABNORMAL LOW (ref 150–400)
RBC: 4.31 MIL/uL (ref 3.87–5.11)
RDW: 14 % (ref 11.5–15.5)
WBC: 5.8 10*3/uL (ref 4.0–10.5)
nRBC: 0 % (ref 0.0–0.2)

## 2018-10-24 LAB — URINALYSIS, ROUTINE W REFLEX MICROSCOPIC
Bilirubin Urine: NEGATIVE
Glucose, UA: NEGATIVE mg/dL
Hgb urine dipstick: NEGATIVE
Ketones, ur: NEGATIVE mg/dL
Nitrite: NEGATIVE
Protein, ur: NEGATIVE mg/dL
Specific Gravity, Urine: 1.02 (ref 1.005–1.030)
pH: 6 (ref 5.0–8.0)

## 2018-10-24 LAB — BASIC METABOLIC PANEL
Anion gap: 10 (ref 5–15)
BUN: 10 mg/dL (ref 6–20)
CO2: 26 mmol/L (ref 22–32)
Calcium: 8.8 mg/dL — ABNORMAL LOW (ref 8.9–10.3)
Chloride: 97 mmol/L — ABNORMAL LOW (ref 98–111)
Creatinine, Ser: 0.52 mg/dL (ref 0.44–1.00)
GFR calc Af Amer: >60 mL/min (ref >60)
GFR calc non Af Amer: >60 mL/min (ref >60)
Glucose, Bld: 85 mg/dL (ref 70–99)
Potassium: 3.6 mmol/L (ref 3.5–5.1)
Sodium: 133 mmol/L — ABNORMAL LOW (ref 135–145)

## 2018-10-24 LAB — HCG, QUANTITATIVE, PREGNANCY: hCG, Beta Chain, Quant, S: 57408 m[IU]/mL — ABNORMAL HIGH (ref <5)

## 2018-10-24 NOTE — ED Notes (Signed)
[employer] reports that they were not able to make contact with pt at her residence, will continue to attempt contact,

## 2018-10-24 NOTE — ED Triage Notes (Signed)
Pt with right flank pain that started last night, denies hx of same or kidney stones.  Pt also informs triage nurse that she is pregnant and does not know how far along she is.  LMP4/2019

## 2018-10-24 NOTE — ED Notes (Signed)
Ultrasound tech went into pt's room to transport pt for test, pt was not in room, primary RN reported that pt went to get her cell phone and to get a piece of paper from her boyfriend in the lobby and was coming back, [employer] officer went to lobby, nobody was in lobby, RN attempted to call contact information for pt and would be told that it was a wrong number, phone number verified with pt's mother Brittney Nicholson who advised that the number [phone] was the correct  Number for pt,

## 2018-10-24 NOTE — ED Notes (Signed)
Pt reports painful lump on lower right ribcage. Mass not present on left side.

## 2018-10-24 NOTE — ED Provider Notes (Signed)
[facility] EMERGENCY DEPARTMENT Provider Note   CSN: [id number] Arrival date & time: 10/24/18  1941    History   Chief Complaint Chief Complaint  Patient presents with  . Flank Pain    HPI Brittney Nicholson is a 28 y.o. female.     HPI  28 year old female comes in with chief complaint of abdominal pain/right-sided flank pain. Patient is G4, P3, she found out that she was pregnant 2 days ago.  She has not had any complications with her prior pregnancy and reports that her LMP was last April, as she had just delivered a baby 4 months ago.  Patient states that she started having right-sided flank discomfort yesterday.  Over the course of the day today the, the pain is gotten worse.  The pain is sharp, burning in nature.  She has no history of similar pain.  The pain is not worse with inspiration, exertion and she has no UTI-like symptoms, vaginal discharge, vaginal bleeding.  Patient admits to heroin use, last use was 3 days ago.  Past Medical History:  Diagnosis Date  . Anxiety   . Depression   . 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 Headache    with this pregnancy  . HSV-2 infection complicating pregnancy   . Opiate use   . Pneumonia   . Pregnant 12/13/2014  . Syncope   . Warts     Patient Active Problem List   Diagnosis Date Noted  . Non-reactive NST (non-stress test) 06/19/2018  . Pregnancy complicated by subutex maintenance, antepartum (HCC) 05/11/2018  . Rubella non-immune status, antepartum 03/19/2018  . Opiate use 03/12/2018  . 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 Depression with anxiety 03/12/2018  . UTI (urinary tract infection) during pregnancy, second trimester 03/12/2018  . 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 Acute cholecystitis 01/25/2013  . Warts 08/18/2012    Past Surgical History:  Procedure Laterality Date  . BREAST LUMPECTOMY  2006  . CHOLECYSTECTOMY N/A 01/25/2013   Procedure: LAPAROSCOPIC CHOLECYSTECTOMY;  Surgeon: Dalia HeadingMark A Jenkins, MD;   Location: [facility];  Service: General;  Laterality: N/A;  . right arm Right    has pins.  . TONSILLECTOMY    . TONSILLECTOMY  2011     OB History    Gravida  5   Para  3   Term  3   Preterm  0   AB  2   Living  3     SAB  2   TAB  0   Ectopic  0   Multiple  0   Live Births  3            Home Medications    Prior to Admission medications   Medication Sig Start Date End Date Taking? Authorizing Provider  buprenorphine (SUBUTEX) 8 MG SUBL SL tablet Place 4 mg under the tongue 3 (three) times daily.    [provider]  ibuprofen (ADVIL,MOTRIN) 600 MG tablet Take 1 tablet (600 mg total) by mouth every 6 (six) hours. 06/22/18   Myrene BuddyFletcher, Jacob, MD    Family History Family History  Problem Relation Age of Onset  . Cancer Mother        uterine  . Diabetes Father   . Kidney failure Father     Social History Social History   Tobacco Use  . Smoking status: Former Smoker    Packs/day: 1.00    Types: Cigarettes  . Smokeless tobacco: Never Used  . Tobacco comment: Quit  September 2019  Substance Use Topics  . Alcohol use: No  . Drug use: Not Currently    Types: Heroin    Comment: last use of heroin 24Jul 2019     Allergies   Gluten and Wheat   Review of Systems Review of Systems  Constitutional: Positive for activity change.  Gastrointestinal: Positive for abdominal pain. Negative for nausea and vomiting.  Genitourinary: Positive for flank pain. Negative for dysuria, pelvic pain, vaginal bleeding and vaginal discharge.  Allergic/Immunologic: Negative for immunocompromised state.  Hematological: Does not bruise/bleed easily.  All other systems reviewed and are negative.    Physical Exam Updated Vital Signs BP (!) 114/52 (BP Location: Right Arm)   Pulse 75   Temp 98.1 F (36.7 C) (Oral)   Resp 20 Comment: pt crying during triage  Ht 5\' 7"  (1.702 m)   Wt 86.2 kg   SpO2 98%   BMI 29.76 kg/m   Physical Exam Vitals signs and nursing  note reviewed.  Constitutional:      Appearance: She is well-developed.  HENT:     Head: Normocephalic and atraumatic.  Neck:     Musculoskeletal: Normal range of motion and neck supple.  Cardiovascular:     Rate and Rhythm: Normal rate.  Pulmonary:     Effort: Pulmonary effort is normal.  Abdominal:     General: Bowel sounds are normal.     Comments: Patient has right flank tenderness.  The tenderness starts at the age of the lower ribs.  She does not have any appreciable rash.  There is no right upper or right lower quadrant tenderness associated with this.  Skin:    General: Skin is warm and dry.  Neurological:     Mental Status: She is alert and oriented to person, place, and time.      ED Treatments / Results  Labs (all labs ordered are listed, but only abnormal results are displayed) Labs Reviewed  CBC WITH DIFFERENTIAL/PLATELET - Abnormal; Notable for the following components:      Result Value   Hemoglobin 11.6 (*)    Platelets 146 (*)    All other components within normal limits  URINALYSIS, ROUTINE W REFLEX MICROSCOPIC - Abnormal; Notable for the following components:   APPearance HAZY (*)    Leukocytes,Ua MODERATE (*)    Bacteria, UA RARE (*)    All other components within normal limits  BASIC METABOLIC PANEL - Abnormal; Notable for the following components:   Sodium 133 (*)    Chloride 97 (*)    Calcium 8.8 (*)    All other components within normal limits  HCG, QUANTITATIVE, PREGNANCY - Abnormal; Notable for the following components:   hCG, Beta Chain, Quant, S 57,408 (*)    All other components within normal limits    EKG None  Radiology No results found.  Procedures Procedures (including critical care time)  Medications Ordered in ED Medications - No data to display   Initial Impression / Assessment and Plan / ED Course  I have reviewed the triage vital signs and the nursing notes.  Pertinent labs & imaging results that were available  during my care of the patient were reviewed by me and considered in my medical decision making (see chart for details).        28 year old female comes in a chief complaint of right-sided flank pain.  Her pain is about 7 out of 10.  She is G4 P3, who just had a baby 4 months ago.  She states that she had a positive UA tract 2 days ago.  On exam she has right flank tenderness without any abdominal discomfort.  Her pain is 7 out of 10 at this time.  Differential diagnosis would include kidney stones, gallstones, ectopic pregnancy.  Less likely to be torsion.  Ultrasound ordered to rule out ectopic.  Appropriate labs ordered.  Few minutes after my assessment, nursing staff came by and inform me that patient is nowhere to be found. I have called [phone] and [phone].  I left a voicemail on patient's phone number area code [phone], asking her to return to the ER for complete assessment to rule out a life-threatening condition.  Both phone numbers had gone to the voicemail.  Final Clinical Impressions(s) / ED Diagnoses   Final diagnoses:  Right flank pain    ED Discharge Orders    None       Derwood Kaplan, MD 10/24/18 2206

## 2018-10-26 ENCOUNTER — Telehealth: Payer: Self-pay | Admitting: Women's Health

## 2018-10-26 NOTE — Telephone Encounter (Signed)
Pt states that she went to [facility] and they stated she has 2 yolk sacs but only 1 heartbeat. Pt requesting an appt.

## 2018-10-26 NOTE — Telephone Encounter (Signed)
Called patient and left message to call back to schedule ob US in 2 weeks.

## 2018-10-26 NOTE — Telephone Encounter (Signed)
Per chart review patient left AMA and ultrasound was not completed. Patient now requesting appointment with [place]. Will send to provider to see how to proceed.

## 2018-10-26 NOTE — Telephone Encounter (Signed)
Repeat scan 2 weeks after the last scanthat showed the 1 viable pregnancy

## 2018-11-05 ENCOUNTER — Other Ambulatory Visit: Payer: Self-pay | Admitting: Obstetrics & Gynecology

## 2018-11-05 DIAGNOSIS — O3680X0 Pregnancy with inconclusive fetal viability, not applicable or unspecified: Secondary | ICD-10-CM

## 2018-11-09 ENCOUNTER — Other Ambulatory Visit: Payer: BC Managed Care – PPO

## 2019-03-05 ENCOUNTER — Emergency Department (HOSPITAL_COMMUNITY)
Admission: EM | Admit: 2019-03-05 | Discharge: 2019-03-06 | Discharge status: Against Medical Advice | Payer: Medicaid Other | Attending: Emergency Medicine | Admitting: Emergency Medicine

## 2019-03-05 ENCOUNTER — Encounter: Payer: Self-pay | Admitting: Emergency Medicine

## 2019-03-05 ENCOUNTER — Other Ambulatory Visit: Payer: Self-pay

## 2019-03-05 DIAGNOSIS — T50901A Poisoning by unspecified drugs, medicaments and biological substances, accidental (unintentional), initial encounter: Secondary | ICD-10-CM

## 2019-03-05 DIAGNOSIS — O26892 Other specified pregnancy related conditions, second trimester: Secondary | ICD-10-CM | POA: Diagnosis present

## 2019-03-05 DIAGNOSIS — T401X1A Poisoning by heroin, accidental (unintentional), initial encounter: Secondary | ICD-10-CM

## 2019-03-05 DIAGNOSIS — Z87891 Personal history of nicotine dependence: Secondary | ICD-10-CM | POA: Insufficient documentation

## 2019-03-05 DIAGNOSIS — Z5320 Procedure and treatment not carried out because of patient's decision for unspecified reasons: Secondary | ICD-10-CM | POA: Diagnosis not present

## 2019-03-05 MED ORDER — NALOXONE HCL 0.4 MG/ML IJ SOLN
0.4000 mg | Freq: Once | INTRAMUSCULAR | Status: AC
  Administered 2019-03-05: 20:00:00 0.4 mg via INTRAVENOUS

## 2019-03-05 MED ORDER — NALOXONE HCL 0.4 MG/ML IJ SOLN
INTRAMUSCULAR | Status: AC
  Administered 2019-03-05: 0.4 mg via INTRAVENOUS
  Filled 2019-03-05: qty 1
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
## 2019-03-05 NOTE — ED Triage Notes (Signed)
Pt arrived via [facility] [place] [id number] after bystanders called 911 for an unresponsive person. Pt was given 2mg  IN narcan by EMS. Pt has hx of heroin use, stating she usually "snorts it". Pt arrived in ED, intermittently alert. Pt is approx 6 months pregnant. EMS v/s 97.6, 130/80, 110, cbg 153, Sp02 97% RA.

## 2019-03-05 NOTE — ED Notes (Signed)
Pt responding aggressively with staff. Attempting to leave. EDP made aware.

## 2019-03-05 NOTE — ED Provider Notes (Signed)
[facility] EMERGENCY DEPARTMENT Provider Note   CSN: [id number] Arrival date & time: 03/05/19  1919     History   Chief Complaint Chief Complaint  Patient presents with  . Drug Overdose    HPI Brittney Nicholson is a 28 y.o. female.     HPI  28 year old female on her fourth pregnancy at approximately 4 months gestational age who presents to the ED after a drug overdose. Per EMS, the patient went unresponsive and apneic in front of another individual who then performed CPR for an unknown period of time. EMS found the patient unresponsive, low respiratory rate, and 2mg  IN narcan was administered, resulting in improvement in the patient's mental status and respiratory rate. The patient arrived to the ED intermittently alert and somnolent.  She asked to use to bathroom during her time in the ED and would not answer further questions.   Past Medical History:  Diagnosis Date  . Anxiety   . Depression   . 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 Headache    with this pregnancy  . HSV-2 infection complicating pregnancy   . Opiate use   . Pneumonia   . Pregnant 12/13/2014  . Syncope   . Warts     Patient Active Problem List   Diagnosis Date Noted  . Non-reactive NST (non-stress test) 06/19/2018  . Pregnancy complicated by subutex maintenance, antepartum (HCC) 05/11/2018  . Rubella non-immune status, antepartum 03/19/2018  . Opiate use 03/12/2018  . 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 Depression with anxiety 03/12/2018  . UTI (urinary tract infection) during pregnancy, second trimester 03/12/2018  . 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 Acute cholecystitis 01/25/2013  . Warts 08/18/2012    Past Surgical History:  Procedure Laterality Date  . BREAST LUMPECTOMY  2006  . CHOLECYSTECTOMY N/A 01/25/2013   Procedure: LAPAROSCOPIC CHOLECYSTECTOMY;  Surgeon: 01/27/2013, MD;  Location: [facility];  Service: General;  Laterality: N/A;  . right arm Right    has pins.  .  TONSILLECTOMY    . TONSILLECTOMY  2011     OB History    Gravida  6   Para  3   Term  3   Preterm  0   AB  2   Living  3     SAB  2   TAB  0   Ectopic  0   Multiple  0   Live Births  3            Home Medications    Prior to Admission medications   Medication Sig Start Date End Date Taking? Authorizing Provider  buprenorphine (SUBUTEX) 8 MG SUBL SL tablet Place 4 mg under the tongue 3 (three) times daily.    [provider]  ibuprofen (ADVIL,MOTRIN) 600 MG tablet Take 1 tablet (600 mg total) by mouth every 6 (six) hours. 06/22/18   06/24/18, MD    Family History Family History  Problem Relation Age of Onset  . Cancer Mother        uterine  . Diabetes Father   . Kidney failure Father     Social History Social History   Tobacco Use  . Smoking status: Former Smoker    Packs/day: 1.00    Types: Cigarettes  . Smokeless tobacco: Never Used  . Tobacco comment: Quit September 2019  Substance Use Topics  . Alcohol use: No  . Drug use: Not Currently    Types: Heroin  Comment: last use of heroin 24Jul 2019     Allergies   Gluten and Wheat   Review of Systems Review of Systems  Unable to perform ROS: Mental status change     Physical Exam Updated Vital Signs BP 116/68 (BP Location: Left Arm)   Pulse (!) 102   Temp 98.3 F (36.8 C) (Oral)   Resp (!) 24   LMP  (LMP Unknown)   SpO2 98%   Physical Exam Vitals signs and nursing note reviewed.  Constitutional:      General: She is not in acute distress.    Appearance: She is well-developed.     Comments: Pregnant female, intermittently alert and somnolent. Agitated, demanding to use the bathroom, somewhat inconsolable  HENT:     Head: Normocephalic and atraumatic.  Eyes:     Conjunctiva/sclera: Conjunctivae normal.  Neck:     Musculoskeletal: Neck supple.  Cardiovascular:     Rate and Rhythm: Normal rate and regular rhythm.     Heart sounds: No murmur.  Pulmonary:      Effort: Pulmonary effort is normal. No respiratory distress.     Breath sounds: Normal breath sounds.  Abdominal:     Palpations: Abdomen is soft.     Tenderness: There is no abdominal tenderness.     Comments: Gravid abdomen, nontender  Musculoskeletal:        General: No signs of injury.  Skin:    General: Skin is warm and dry.  Neurological:     Mental Status: She is alert.      ED Treatments / Results  Labs (all labs ordered are listed, but only abnormal results are displayed) Labs Reviewed - No data to display  EKG None  Radiology No results found.  Procedures Procedures (including critical care time)  Medications Ordered in ED Medications  naloxone (NARCAN) injection 0.4 mg (0.4 mg Intravenous Given 03/05/19 1954)     Initial Impression / Assessment and Plan / ED Course  I have reviewed the triage vital signs and the nursing notes.  Pertinent labs & imaging results that were available during my care of the patient were reviewed by me and considered in my medical decision making (see chart for details).        The patient arrives to the ED after heroin overdose. The patient admitted to snorting heroin today. She responded to Narcan and maintained normal O2 saturations and a normal respiratory rate in the ED. Fetal heart tones were appreciated on bedside OB POCUS. The patient is status post bystander CPR for an unknown period of time. She asked to use to bathroom during her time in the ED and would not answer further questions.   EKG was performed which showed sinus tachycardia with nor ST-T waves, no evidence of ischemic changes. At this time, concern for opiate overdose. Will monitor and administer Narcan as needed.  We explained to the patient that she will require monitoring, both for her and for her fetus.  Following initial triage and evaluation in the trauma bay, the patient requested to leave the Emergency Department AGAINST MEDICAL ADVICE. She is  able to express to me when she is alert and oriented and breathing without any difficulty that taking heroin can cause you to stop breathing and that this will cause you to die if you stop breathing.  She still wants to go home.  She has an ankle bracelet on and states that she does not want to go back to jail and states  that if she is not home by a certain hour she will break the rules of living at home and be kicked out.  At this time she has medical decision-making capacity.  She is aware that she is leaving [place] and that she is taking her life and the life of her fetus in her hands by doing so.  Final Clinical Impressions(s) / ED Diagnoses   Final diagnoses:  Accidental drug overdose, initial encounter  Accidental overdose of heroin, initial encounter [facility])    ED Discharge Orders    None       Regan Lemming, MD 03/06/19 Danelle Berry    Noemi Chapel, MD 03/06/19 [phone]

## 2019-03-05 NOTE — Discharge Instructions (Signed)
You have chosen to leave [place].  You are aware that taking heroin can cause you to stop breathing which will cause you to die and yet you have still decided to go home.  You may return at any time should you change your mind

## 2019-03-25 ENCOUNTER — Encounter (HOSPITAL_COMMUNITY): Payer: Self-pay | Admitting: *Deleted

## 2019-03-25 ENCOUNTER — Other Ambulatory Visit: Payer: Self-pay

## 2019-03-25 ENCOUNTER — Inpatient Hospital Stay (HOSPITAL_COMMUNITY)
Admission: AD | Admit: 2019-03-25 | Discharge: 2019-03-25 | Discharge status: Against Medical Advice | Payer: Medicaid Other | Attending: Obstetrics and Gynecology | Admitting: Obstetrics and Gynecology

## 2019-03-25 DIAGNOSIS — R1030 Lower abdominal pain, unspecified: Secondary | ICD-10-CM | POA: Diagnosis present

## 2019-03-25 DIAGNOSIS — Z5329 Procedure and treatment not carried out because of patient's decision for other reasons: Secondary | ICD-10-CM | POA: Diagnosis not present

## 2019-03-25 LAB — URINALYSIS, ROUTINE W REFLEX MICROSCOPIC
Bilirubin Urine: NEGATIVE
Glucose, UA: NEGATIVE mg/dL
Hgb urine dipstick: NEGATIVE
Ketones, ur: 20 mg/dL — AB
Nitrite: NEGATIVE
Protein, ur: 30 mg/dL — AB
Specific Gravity, Urine: 1.029 (ref 1.005–1.030)
pH: 6 (ref 5.0–8.0)

## 2019-03-25 NOTE — MAU Note (Addendum)
Pt presents to MAU via EMS c/o lower abdominal pain and possible LOF.  Pt states she has been in a very stressful situation over the last few weeks. Pt reports she was supposed to move into a new house but when her husband went to go to the bank he was in a really bad motorcycle accident that sent him to [facility]. Pt reports she walked/hitchiked from [place] to [place] to see him because they didn't think he would make it.    Pt reports a increased in urination and states sometimes it takes a while for her to pee even though she feels the urge to go. Pt reports +FM. No vaginal bleeding noted. Pt reports about 1800 she started to notice her underwear get wet so she thought it would be safe to come here.

## 2019-03-25 NOTE — MAU Note (Addendum)
Pt stated to ask to use the bathroom and her BR was dirty so she went to the front bathroom on the unit. She didn't come back to her room so myself and the other RN's looked for the patient and she was no where to be found. Pt's gown was in the front bathroom and all the patients belongings were gone out of her room. Provider Blanch Media CNM notified of pt leaving. Security notified. [facility].

## 2019-04-01 ENCOUNTER — Encounter (HOSPITAL_COMMUNITY): Payer: Self-pay

## 2019-04-01 ENCOUNTER — Inpatient Hospital Stay (HOSPITAL_COMMUNITY): Payer: Medicaid Other

## 2019-04-01 ENCOUNTER — Other Ambulatory Visit: Payer: Self-pay

## 2019-04-01 ENCOUNTER — Inpatient Hospital Stay (HOSPITAL_COMMUNITY)
Admission: AD | Admit: 2019-04-01 | Discharge: 2019-04-09 | DRG: 831 | Disposition: A | Discharge status: Home / Self Care | Payer: Medicaid Other | Attending: Obstetrics and Gynecology | Admitting: Obstetrics and Gynecology

## 2019-04-01 DIAGNOSIS — O4103X0 Oligohydramnios, third trimester, not applicable or unspecified: Secondary | ICD-10-CM | POA: Diagnosis present

## 2019-04-01 DIAGNOSIS — O4693 Antepartum hemorrhage, unspecified, third trimester: Secondary | ICD-10-CM | POA: Diagnosis not present

## 2019-04-01 DIAGNOSIS — O0930 Supervision of pregnancy with insufficient antenatal care, unspecified trimester: Secondary | ICD-10-CM

## 2019-04-01 DIAGNOSIS — R079 Chest pain, unspecified: Secondary | ICD-10-CM | POA: Diagnosis present

## 2019-04-01 DIAGNOSIS — O26893 Other specified pregnancy related conditions, third trimester: Secondary | ICD-10-CM

## 2019-04-01 DIAGNOSIS — O99323 Drug use complicating pregnancy, third trimester: Secondary | ICD-10-CM

## 2019-04-01 DIAGNOSIS — F1123 Opioid dependence with withdrawal: Secondary | ICD-10-CM | POA: Diagnosis present

## 2019-04-01 DIAGNOSIS — Z3A33 33 weeks gestation of pregnancy: Secondary | ICD-10-CM

## 2019-04-01 DIAGNOSIS — Z8674 Personal history of sudden cardiac arrest: Secondary | ICD-10-CM

## 2019-04-01 DIAGNOSIS — N939 Abnormal uterine and vaginal bleeding, unspecified: Secondary | ICD-10-CM

## 2019-04-01 DIAGNOSIS — Z87891 Personal history of nicotine dependence: Secondary | ICD-10-CM

## 2019-04-01 DIAGNOSIS — O99320 Drug use complicating pregnancy, unspecified trimester: Secondary | ICD-10-CM

## 2019-04-01 DIAGNOSIS — A6000 Herpesviral infection of urogenital system, unspecified: Secondary | ICD-10-CM | POA: Diagnosis present

## 2019-04-01 DIAGNOSIS — O98313 Other infections with a predominantly sexual mode of transmission complicating pregnancy, third trimester: Secondary | ICD-10-CM | POA: Diagnosis present

## 2019-04-01 DIAGNOSIS — N3001 Acute cystitis with hematuria: Secondary | ICD-10-CM

## 2019-04-01 DIAGNOSIS — O0933 Supervision of pregnancy with insufficient antenatal care, third trimester: Secondary | ICD-10-CM

## 2019-04-01 DIAGNOSIS — F1120 Opioid dependence, uncomplicated: Secondary | ICD-10-CM

## 2019-04-01 DIAGNOSIS — Z20828 Contact with and (suspected) exposure to other viral communicable diseases: Secondary | ICD-10-CM | POA: Diagnosis present

## 2019-04-01 DIAGNOSIS — O09893 Supervision of other high risk pregnancies, third trimester: Secondary | ICD-10-CM

## 2019-04-01 DIAGNOSIS — N760 Acute vaginitis: Secondary | ICD-10-CM | POA: Diagnosis present

## 2019-04-01 DIAGNOSIS — R350 Frequency of micturition: Secondary | ICD-10-CM

## 2019-04-01 DIAGNOSIS — O2342 Unspecified infection of urinary tract in pregnancy, second trimester: Secondary | ICD-10-CM | POA: Diagnosis present

## 2019-04-01 DIAGNOSIS — F1190 Opioid use, unspecified, uncomplicated: Secondary | ICD-10-CM | POA: Diagnosis present

## 2019-04-01 DIAGNOSIS — O2343 Unspecified infection of urinary tract in pregnancy, third trimester: Secondary | ICD-10-CM | POA: Diagnosis present

## 2019-04-01 DIAGNOSIS — O4100X0 Oligohydramnios, unspecified trimester, not applicable or unspecified: Secondary | ICD-10-CM

## 2019-04-01 DIAGNOSIS — B9689 Other specified bacterial agents as the cause of diseases classified elsewhere: Secondary | ICD-10-CM | POA: Diagnosis present

## 2019-04-01 DIAGNOSIS — O23593 Infection of other part of genital tract in pregnancy, third trimester: Secondary | ICD-10-CM | POA: Diagnosis present

## 2019-04-01 LAB — CBC
HCT: 36 % (ref 36.0–46.0)
Hemoglobin: 12 g/dL (ref 12.0–15.0)
MCH: 28.2 pg (ref 26.0–34.0)
MCHC: 33.3 g/dL (ref 30.0–36.0)
MCV: 84.7 fL (ref 80.0–100.0)
Platelets: 180 10*3/uL (ref 150–400)
RBC: 4.25 MIL/uL (ref 3.87–5.11)
RDW: 12.9 % (ref 11.5–15.5)
WBC: 12 10*3/uL — ABNORMAL HIGH (ref 4.0–10.5)
nRBC: 0 % (ref 0.0–0.2)

## 2019-04-01 LAB — URINALYSIS, ROUTINE W REFLEX MICROSCOPIC
Bilirubin Urine: NEGATIVE
Glucose, UA: NEGATIVE mg/dL
Ketones, ur: NEGATIVE mg/dL
Nitrite: NEGATIVE
Protein, ur: 100 mg/dL — AB
Specific Gravity, Urine: 1.026 (ref 1.005–1.030)
pH: 6 (ref 5.0–8.0)

## 2019-04-01 LAB — RAPID URINE DRUG SCREEN, HOSP PERFORMED
Amphetamines: NOT DETECTED
Barbiturates: NOT DETECTED
Benzodiazepines: NOT DETECTED
Cocaine: NOT DETECTED
Opiates: POSITIVE — AB
Tetrahydrocannabinol: NOT DETECTED

## 2019-04-01 LAB — WET PREP, GENITAL
Sperm: NONE SEEN
Trich, Wet Prep: NONE SEEN
Yeast Wet Prep HPF POC: NONE SEEN

## 2019-04-01 MED ORDER — SODIUM CHLORIDE 0.9 % IV SOLN
2.0000 g | Freq: Once | INTRAVENOUS | Status: AC
  Administered 2019-04-02: 2 g via INTRAVENOUS
  Filled 2019-04-01: qty 2

## 2019-04-01 MED ORDER — PHENAZOPYRIDINE HCL 100 MG PO TABS
200.0000 mg | ORAL_TABLET | Freq: Once | ORAL | Status: AC
  Administered 2019-04-02: 200 mg via ORAL
  Filled 2019-04-01: qty 2

## 2019-04-01 MED ORDER — LACTATED RINGERS IV SOLN: INTRAVENOUS | Status: DC

## 2019-04-01 MED ORDER — SODIUM CHLORIDE 0.9 % IV SOLN
INTRAVENOUS | Status: DC
  Administered 2019-04-02: 01:00:00 via INTRAVENOUS

## 2019-04-01 NOTE — MAU Provider Note (Signed)
Chief Complaint:  Abdominal Pain and Vaginal Bleeding   Provider saw patient at 2230  HPI: Brittney Nicholson is a 28 y.o. [id number] at 63w5dwho presents to maternity admissions reporting severe abdominal pain and bleeding.  Started about 4 hours ago.  . She reports good fetal movement, denies LOF, vaginal itching/burning, urinary symptoms, h/a, dizziness, n/v, diarrhea, constipation or fever/chills.  Denies recent drug or alcohol use  Has had no prenatal care this pregnancy.  Patient states she had care in June.   History of recent drug overdose (03/05/19) with cardiac arrest requiring CPR History of 3 prior vaginal deliveries, uncomplicated ?hx HSV   RN Note: Brittney Nicholson is a 28 y.o. at [redacted]w[redacted]d here in MAU reporting: Abdominal pain and VB.  Onset of complaint: 1800 Pain score: 7 FHT:130 Lab orders placed from triage:  Past Medical History: Past Medical History:  Diagnosis Date  . Anxiety   . Depression   . 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 Headache    with this pregnancy  . HSV-2 infection complicating pregnancy   . Opiate use   . Pneumonia   . Pregnant 12/13/2014  . Syncope   . Warts     Past obstetric history: OB History  Gravida Para Term Preterm AB Living  6 3 3  0 2 3  SAB TAB Ectopic Multiple Live Births  2 0 0 0 3    # Outcome Date GA Lbr Len/2nd Weight Sex Delivery Anes PTL Lv  6 Current           5 Term 06/20/18 [redacted]w[redacted]d  3070 g F Vag-Spont EPI  LIV  4 Term 07/28/15 [redacted]w[redacted]d 10:20 / 00:04 3549 g F Vag-Spont EPI N LIV  3 Term 11/08/12 [redacted]w[redacted]d 01:36 / 14:15 4034 g F Vag-Spont EPI N LIV     Birth Comments: WNL  2 SAB           1 SAB             Past Surgical History: Past Surgical History:  Procedure Laterality Date  . BREAST LUMPECTOMY  2006  . CHOLECYSTECTOMY N/A 01/25/2013   Procedure: LAPAROSCOPIC CHOLECYSTECTOMY;  Surgeon: 01/27/2013, MD;  Location: [facility];  Service: General;  Laterality: N/A;  . right arm Right    has pins.  . TONSILLECTOMY    .  TONSILLECTOMY  2011    Family History: Family History  Problem Relation Age of Onset  . Cancer Mother        uterine  . Diabetes Father   . Kidney failure Father     Social History: Social History   Tobacco Use  . Smoking status: Former Smoker    Packs/day: 1.00    Types: Cigarettes  . Smokeless tobacco: Never Used  . Tobacco comment: Quit September 2019  Substance Use Topics  . Alcohol use: No  . Drug use: Not Currently    Types: Heroin    Comment: last use of heroin 24Jul 2019    Allergies:  Allergies  Allergen Reactions  . Gluten Other (See Comments)    RECTAL BLEEDING  . Wheat Other (See Comments)    Rectal bleeding    Meds:  Medications Prior to Admission  Medication Sig Dispense Refill Last Dose  . Prenatal Vit-Fe Fumarate-FA (MULTIVITAMIN-PRENATAL) 27-0.8 MG TABS tablet Take 1 tablet by mouth daily at 12 noon.   04/01/2019 at Unknown time  . buprenorphine (SUBUTEX) 8 MG SUBL SL tablet Place 4 mg under the tongue 3 (three)  times daily.   More than a month at Unknown time  . ibuprofen (ADVIL,MOTRIN) 600 MG tablet Take 1 tablet (600 mg total) by mouth every 6 (six) hours. 30 tablet 0     I have reviewed patient's Past Medical Hx, Surgical Hx, Family Hx, Social Hx, medications and allergies.   ROS:  Review of Systems  Constitutional: Negative for chills and fever.  Respiratory: Negative for shortness of breath.   Cardiovascular: Positive for chest pain. Negative for leg swelling.  Gastrointestinal: Positive for abdominal pain. Negative for constipation, diarrhea and nausea.  Genitourinary: Positive for frequency, pelvic pain and vaginal bleeding. Negative for dysuria.  Neurological: Negative for dizziness and weakness.  Psychiatric/Behavioral:       Drug addiction    Other systems negative  Physical Exam   Patient Vitals for the past 24 hrs:  BP Temp Temp src Pulse Resp  04/01/19 2246 (!) 114/56 - - 85 -  04/01/19 2239 (!) 111/51 - - 88 -   04/01/19 2233 (!) 136/112 98 F (36.7 C) Oral 96 20   Constitutional: Well-developed, well-nourished female in no acute distress.  Cardiovascular: normal rate and rhythm Respiratory: normal effort, clear to auscultation bilaterally GI: Abd soft, non-tender, gravid appropriate for gestational age.   No rebound or guarding.       Fundal height = 36-37cm MS: Extremities nontender, no edema, normal ROM Neurologic: Alert and oriented x 4.  GU: Neg CVAT.  PELVIC EXAM: Cervix pink, visually closed, without lesion, small  white creamy discharge, vaginal walls and external genitalia normal   No blood visible in vagina  >> subsequent exam there was blood in vault  Dilation: 1 Effacement (%): 30 Station: -2 Presentation: Vertex Exam by:: Wynelle BourgeoisMarie Janeisha Ryle, CNM  FHT:  Baseline 140 , moderate variability, accelerations present, no decelerations Contractions: Frequent, Irregular, short x 30 seconds each    Labs: --/--/O POS (01/10 1304) Results for orders placed or performed during the hospital encounter of 04/01/19 (from the past 24 hour(s))  Urine rapid drug screen (hosp performed)     Status: Abnormal   Collection Time: 04/01/19 10:47 PM  Result Value Ref Range   Opiates POSITIVE (A) NONE DETECTED   Cocaine NONE DETECTED NONE DETECTED   Benzodiazepines NONE DETECTED NONE DETECTED   Amphetamines NONE DETECTED NONE DETECTED   Tetrahydrocannabinol NONE DETECTED NONE DETECTED   Barbiturates NONE DETECTED NONE DETECTED  Wet prep, genital     Status: Abnormal   Collection Time: 04/01/19 10:47 PM   Specimen: Cervix  Result Value Ref Range   Yeast Wet Prep HPF POC NONE SEEN NONE SEEN   Trich, Wet Prep NONE SEEN NONE SEEN   Clue Cells Wet Prep HPF POC PRESENT (A) NONE SEEN   WBC, Wet Prep HPF POC MANY (A) NONE SEEN   Sperm NONE SEEN   Urinalysis, Routine w reflex microscopic     Status: Abnormal   Collection Time: 04/01/19 10:47 PM  Result Value Ref Range   Color, Urine AMBER (A) YELLOW    APPearance CLOUDY (A) CLEAR   Specific Gravity, Urine 1.026 1.005 - 1.030   pH 6.0 5.0 - 8.0   Glucose, UA NEGATIVE NEGATIVE mg/dL   Hgb urine dipstick SMALL (A) NEGATIVE   Bilirubin Urine NEGATIVE NEGATIVE   Ketones, ur NEGATIVE NEGATIVE mg/dL   Protein, ur 272100 (A) NEGATIVE mg/dL   Nitrite NEGATIVE NEGATIVE   Leukocytes,Ua LARGE (A) NEGATIVE   RBC / HPF 6-10 0 - 5 RBC/hpf   WBC,  UA 21-50 0 - 5 WBC/hpf   Bacteria, UA RARE (A) NONE SEEN   Squamous Epithelial / LPF 21-50 0 - 5   Mucus PRESENT    Ca Oxalate Crys, UA PRESENT    Non Squamous Epithelial 0-5 (A) NONE SEEN  Troponin I (High Sensitivity)     Status: None   Collection Time: 04/01/19 11:20 PM  Result Value Ref Range   Troponin I (High Sensitivity) 3 <18 ng/L  Brain natriuretic peptide     Status: None   Collection Time: 04/01/19 11:20 PM  Result Value Ref Range   B Natriuretic Peptide 24.2 0.0 - 100.0 pg/mL  CBC     Status: Abnormal   Collection Time: 04/01/19 11:20 PM  Result Value Ref Range   WBC 12.0 (H) 4.0 - 10.5 K/uL   RBC 4.25 3.87 - 5.11 MIL/uL   Hemoglobin 12.0 12.0 - 15.0 g/dL   HCT 36.0 36.0 - 46.0 %   MCV 84.7 80.0 - 100.0 fL   MCH 28.2 26.0 - 34.0 pg   MCHC 33.3 30.0 - 36.0 g/dL   RDW 12.9 11.5 - 15.5 %   Platelets 180 150 - 400 K/uL   nRBC 0.0 0.0 - 0.2 %  Comprehensive metabolic panel     Status: Abnormal   Collection Time: 04/01/19 11:20 PM  Result Value Ref Range   Sodium 134 (L) 135 - 145 mmol/L   Potassium 3.6 3.5 - 5.1 mmol/L   Chloride 103 98 - 111 mmol/L   CO2 20 (L) 22 - 32 mmol/L   Glucose, Bld 85 70 - 99 mg/dL   BUN 8 6 - 20 mg/dL   Creatinine, Ser 0.60 0.44 - 1.00 mg/dL   Calcium 9.1 8.9 - 10.3 mg/dL   Total Protein 6.7 6.5 - 8.1 g/dL   Albumin 3.4 (L) 3.5 - 5.0 g/dL   AST 17 15 - 41 U/L   ALT 15 0 - 44 U/L   Alkaline Phosphatase 119 38 - 126 U/L   Total Bilirubin 0.5 0.3 - 1.2 mg/dL   GFR calc non Af Amer >60 >60 mL/min   GFR calc Af Amer >60 >60 mL/min   Anion gap 11 5 - 15     Imaging:  Dg Chest 1 View  Result Date: 04/01/2019 CLINICAL DATA:  Chest pain. Pregnant patient in third trimester pregnancy. EXAM: CHEST  1 VIEW COMPARISON:  02/11/2016 FINDINGS: The cardiomediastinal contours are normal. The lungs are clear. Pulmonary vasculature is normal. No consolidation, pleural effusion, or pneumothorax. No acute osseous abnormalities are seen. IMPRESSION: Normal chest radiograph. Electronically Signed   By: Melanie  Sanford M.D.   On: 04/01/2019 23:53   Limited OB US Oligohydramnios, 8.25cm, 6%ile Anterior placenta, sign of abruption EFW:  [redacted]w[redacted]d, Best EDC [redacted]w[redacted]d  MAU Course/MDM: I have ordered labs and reviewed results.  NST reviewed, reassuring with irregular contractions Consult Dr Constant with presentation, exam findings and test results.   Workup for chest pain was negative.  EKG showed normal sinus rhythm, CXR was normal   Troponin and BNP                                                              were both normal . Suspect may be chest wall pain OB workup:  Dates   verified by limited US, new oligohydramnios discovered.  Exam negative for PPROM (no pooling and Fern Negative)    No sign of abruption on US.   Irregular contractions treated with IV fluids and Procardia x 1 per Dr Constant.   UA suspicious for UTI, Rocephin ordered.  Pyridium given for urinary frequency.   OB panel and other labs ordered.    UDS positive for opiates.   Assessment: 1. Chest pain   2. Preterm labor   3. Vaginal bleeding in pregnancy, third trimester   4.     Probable UTI 5.      Oligohydramnios  Plan: Admit to [facility] for unexplained third trimester bleeding Routine orders New OB labs OB US MFM Detail tomorrow Rocephin q 24 hrs until urine culture resulted IV hydration Neonatology consult for PTL and Opiod use planning MFM consult for 3rd trimester bleeding and Oligohydramnios MD team to follow.  Raegyn Renda CNM, MSN Certified  Nurse-Midwife 04/01/2019 11:56 PM  

## 2019-04-01 NOTE — MAU Note (Signed)
.  Brittney Nicholson is a 28 y.o. at [redacted]w[redacted]d here in MAU reporting: Abdominal pain and VB.  Onset of complaint: 1800 Pain score: 7 FHT:130 Lab orders placed from triage:

## 2019-04-02 ENCOUNTER — Inpatient Hospital Stay (HOSPITAL_COMMUNITY): Payer: Medicaid Other

## 2019-04-02 DIAGNOSIS — O0933 Supervision of pregnancy with insufficient antenatal care, third trimester: Secondary | ICD-10-CM

## 2019-04-02 DIAGNOSIS — A6000 Herpesviral infection of urogenital system, unspecified: Secondary | ICD-10-CM | POA: Diagnosis present

## 2019-04-02 DIAGNOSIS — O98313 Other infections with a predominantly sexual mode of transmission complicating pregnancy, third trimester: Secondary | ICD-10-CM | POA: Diagnosis present

## 2019-04-02 DIAGNOSIS — Z3A33 33 weeks gestation of pregnancy: Secondary | ICD-10-CM | POA: Diagnosis not present

## 2019-04-02 DIAGNOSIS — R079 Chest pain, unspecified: Secondary | ICD-10-CM | POA: Diagnosis present

## 2019-04-02 DIAGNOSIS — O99323 Drug use complicating pregnancy, third trimester: Secondary | ICD-10-CM | POA: Diagnosis present

## 2019-04-02 DIAGNOSIS — O23593 Infection of other part of genital tract in pregnancy, third trimester: Secondary | ICD-10-CM | POA: Diagnosis present

## 2019-04-02 DIAGNOSIS — Z87891 Personal history of nicotine dependence: Secondary | ICD-10-CM | POA: Diagnosis not present

## 2019-04-02 DIAGNOSIS — O26893 Other specified pregnancy related conditions, third trimester: Secondary | ICD-10-CM | POA: Diagnosis present

## 2019-04-02 DIAGNOSIS — Z20828 Contact with and (suspected) exposure to other viral communicable diseases: Secondary | ICD-10-CM | POA: Diagnosis present

## 2019-04-02 DIAGNOSIS — O4103X0 Oligohydramnios, third trimester, not applicable or unspecified: Secondary | ICD-10-CM

## 2019-04-02 DIAGNOSIS — F1123 Opioid dependence with withdrawal: Secondary | ICD-10-CM | POA: Diagnosis present

## 2019-04-02 DIAGNOSIS — B9689 Other specified bacterial agents as the cause of diseases classified elsewhere: Secondary | ICD-10-CM | POA: Diagnosis present

## 2019-04-02 DIAGNOSIS — Z3A34 34 weeks gestation of pregnancy: Secondary | ICD-10-CM | POA: Diagnosis not present

## 2019-04-02 DIAGNOSIS — F1190 Opioid use, unspecified, uncomplicated: Secondary | ICD-10-CM | POA: Diagnosis present

## 2019-04-02 DIAGNOSIS — O2343 Unspecified infection of urinary tract in pregnancy, third trimester: Secondary | ICD-10-CM | POA: Diagnosis present

## 2019-04-02 DIAGNOSIS — O09893 Supervision of other high risk pregnancies, third trimester: Secondary | ICD-10-CM

## 2019-04-02 DIAGNOSIS — O4693 Antepartum hemorrhage, unspecified, third trimester: Secondary | ICD-10-CM | POA: Diagnosis present

## 2019-04-02 DIAGNOSIS — Z8674 Personal history of sudden cardiac arrest: Secondary | ICD-10-CM | POA: Diagnosis not present

## 2019-04-02 DIAGNOSIS — N760 Acute vaginitis: Secondary | ICD-10-CM | POA: Diagnosis present

## 2019-04-02 LAB — COMPREHENSIVE METABOLIC PANEL
ALT: 15 U/L (ref 0–44)
AST: 17 U/L (ref 15–41)
Albumin: 3.4 g/dL — ABNORMAL LOW (ref 3.5–5.0)
Alkaline Phosphatase: 119 U/L (ref 38–126)
Anion gap: 11 (ref 5–15)
BUN: 8 mg/dL (ref 6–20)
CO2: 20 mmol/L — ABNORMAL LOW (ref 22–32)
Calcium: 9.1 mg/dL (ref 8.9–10.3)
Chloride: 103 mmol/L (ref 98–111)
Creatinine, Ser: 0.6 mg/dL (ref 0.44–1.00)
GFR calc Af Amer: >60 mL/min (ref >60)
GFR calc non Af Amer: >60 mL/min (ref >60)
Glucose, Bld: 85 mg/dL (ref 70–99)
Potassium: 3.6 mmol/L (ref 3.5–5.1)
Sodium: 134 mmol/L — ABNORMAL LOW (ref 135–145)
Total Bilirubin: 0.5 mg/dL (ref 0.3–1.2)
Total Protein: 6.7 g/dL (ref 6.5–8.1)

## 2019-04-02 LAB — BRAIN NATRIURETIC PEPTIDE: B Natriuretic Peptide: 24.2 pg/mL (ref 0.0–100.0)

## 2019-04-02 LAB — CULTURE, OB URINE

## 2019-04-02 LAB — HEPATITIS B SURFACE ANTIGEN: Hepatitis B Surface Ag: NONREACTIVE

## 2019-04-02 LAB — HIV ANTIBODY (ROUTINE TESTING W REFLEX): HIV Screen 4th Generation wRfx: NONREACTIVE

## 2019-04-02 LAB — TROPONIN I (HIGH SENSITIVITY): Troponin I (High Sensitivity): 3 ng/L (ref <18)

## 2019-04-02 LAB — TYPE AND SCREEN
ABO/RH(D): O POS
Antibody Screen: NEGATIVE

## 2019-04-02 LAB — SARS CORONAVIRUS 2 BY RT PCR (HOSPITAL ORDER, PERFORMED IN ~~LOC~~ HOSPITAL LAB): SARS Coronavirus 2: NEGATIVE

## 2019-04-02 LAB — ABO/RH: ABO/RH(D): O POS

## 2019-04-02 LAB — RPR: RPR Ser Ql: NONREACTIVE

## 2019-04-02 MED ORDER — VALACYCLOVIR HCL 500 MG PO TABS
500.0000 mg | ORAL_TABLET | Freq: Two times a day (BID) | ORAL | Status: DC
  Administered 2019-04-02 – 2019-04-09 (×15): 500 mg via ORAL
  Filled 2019-04-02 (×15): qty 1

## 2019-04-02 MED ORDER — SODIUM CHLORIDE 0.9 % IV SOLN
1.0000 g | INTRAVENOUS | Status: DC
  Administered 2019-04-02: 1 g via INTRAVENOUS
  Filled 2019-04-02: qty 1

## 2019-04-02 MED ORDER — BETAMETHASONE SOD PHOS & ACET 6 (3-3) MG/ML IJ SUSP
12.0000 mg | INTRAMUSCULAR | Status: AC
  Administered 2019-04-02 – 2019-04-03 (×2): 12 mg via INTRAMUSCULAR
  Filled 2019-04-02: qty 5

## 2019-04-02 MED ORDER — BUPRENORPHINE HCL-NALOXONE HCL 2-0.5 MG SL SUBL
2.0000 | SUBLINGUAL_TABLET | Freq: Two times a day (BID) | SUBLINGUAL | Status: DC
  Administered 2019-04-02 (×2): 2 via SUBLINGUAL
  Filled 2019-04-02 (×2): qty 2

## 2019-04-02 MED ORDER — PROMETHAZINE HCL 25 MG PO TABS
25.0000 mg | ORAL_TABLET | Freq: Four times a day (QID) | ORAL | Status: DC | PRN
  Administered 2019-04-02 (×2): 25 mg via ORAL
  Filled 2019-04-02 (×3): qty 1

## 2019-04-02 MED ORDER — ACETAMINOPHEN 325 MG PO TABS
650.0000 mg | ORAL_TABLET | ORAL | Status: DC | PRN
  Administered 2019-04-02 – 2019-04-09 (×12): 650 mg via ORAL
  Filled 2019-04-02 (×11): qty 2

## 2019-04-02 MED ORDER — NIFEDIPINE 10 MG PO CAPS
10.0000 mg | ORAL_CAPSULE | Freq: Once | ORAL | Status: AC
  Administered 2019-04-02: 01:00:00 10 mg via ORAL
  Filled 2019-04-02: qty 1

## 2019-04-02 MED ORDER — PRENATAL MULTIVITAMIN CH
1.0000 | ORAL_TABLET | Freq: Every day | ORAL | Status: DC
  Administered 2019-04-02 – 2019-04-09 (×8): 1 via ORAL
  Filled 2019-04-02 (×8): qty 1

## 2019-04-02 MED ORDER — METRONIDAZOLE 500 MG PO TABS
500.0000 mg | ORAL_TABLET | Freq: Two times a day (BID) | ORAL | Status: AC
  Administered 2019-04-02 – 2019-04-08 (×14): 500 mg via ORAL
  Filled 2019-04-02 (×15): qty 1

## 2019-04-02 MED ORDER — SODIUM CHLORIDE 0.9 % IV SOLN
INTRAVENOUS | Status: DC
  Administered 2019-04-02: 02:00:00 via INTRAVENOUS

## 2019-04-02 MED ORDER — CALCIUM CARBONATE ANTACID 500 MG PO CHEW
2.0000 | CHEWABLE_TABLET | ORAL | Status: DC | PRN
  Administered 2019-04-02: 400 mg via ORAL
  Filled 2019-04-02: qty 2

## 2019-04-02 MED ORDER — DOCUSATE SODIUM 100 MG PO CAPS
100.0000 mg | ORAL_CAPSULE | Freq: Every day | ORAL | Status: DC
  Administered 2019-04-02 – 2019-04-09 (×8): 100 mg via ORAL
  Filled 2019-04-02 (×8): qty 1

## 2019-04-02 MED ORDER — ZOLPIDEM TARTRATE 5 MG PO TABS
5.0000 mg | ORAL_TABLET | Freq: Every evening | ORAL | Status: DC | PRN
  Administered 2019-04-02 – 2019-04-09 (×7): 5 mg via ORAL
  Filled 2019-04-02 (×6): qty 1

## 2019-04-02 NOTE — Progress Notes (Addendum)
FACULTY PRACTICE ANTEPARTUM COMPREHENSIVE PROGRESS NOTE  Brittney Nicholson is a 28 y.o. [id number] at [redacted]w[redacted]d who is admitted for vaginal bleeding.  Estimated Date of Delivery: 05/15/19 Fetal presentation is cephalic.  Length of Stay:  0 Days. Admitted 04/01/2019  Subjective: No further bleeding.  She feels she is withdrawing from her heroin that was possibly laced with fentanyl, last took it yesterday afternoon. Feels very jittery, feels hot and cold and does not feel right.  Patient reports good fetal movement.  She reports no uterine contractions, no bleeding and no loss of fluid per vagina.  Vitals:  Blood pressure (!) 99/56, pulse 75, temperature 97.6 F (36.4 C), temperature source Oral, resp. rate 17, height  (1.702 m), weight 72.1 kg, last menstrual period 09/09/2018, SpO2 99 %, currently breastfeeding. Physical Examination: CONSTITUTIONAL:No acute distress.  CARDIOVASCULAR: Normal heart rate noted, regular rhythm RESPIRATORY: Effort and breath sounds normal, no problems with respiration noted MUSCULOSKELETAL: Normal range of motion. No edema and no tenderness. 2+ distal pulses. ABDOMEN: Soft, nontender, nondistended, gravid. CERVIX: Dilation: 1 Effacement (%): 40 Cervical Position: Posterior Station: -3, Ballotable Presentation: Vertex Exam by:: Williams CNM  Fetal monitoring: FHR: 145 bpm, Variability: moderate, Accelerations: Present, Decelerations: Absent  Uterine activity: No contractions  Results for orders placed or performed during the hospital encounter of 04/01/19 (from the past 48 hour(s))  Urine rapid drug screen (hosp performed)     Status: Abnormal   Collection Time: 04/01/19 10:47 PM  Result Value Ref Range   Opiates POSITIVE (A) NONE DETECTED   Cocaine NONE DETECTED NONE DETECTED   Benzodiazepines NONE DETECTED NONE DETECTED   Amphetamines NONE DETECTED NONE DETECTED   Tetrahydrocannabinol NONE DETECTED NONE DETECTED   Barbiturates NONE DETECTED NONE  DETECTED    Comment: (NOTE) DRUG SCREEN FOR MEDICAL PURPOSES ONLY.  IF CONFIRMATION IS NEEDED FOR ANY PURPOSE, NOTIFY LAB WITHIN 5 DAYS. LOWEST DETECTABLE LIMITS FOR URINE DRUG SCREEN Drug Class                     Cutoff (ng/mL) Amphetamine and metabolites    1000 Barbiturate and metabolites    200 Benzodiazepine                 200 Tricyclics and metabolites     300 Opiates and metabolites        300 Cocaine and metabolites        300 THC                            50 Performed at [facility] Lab, [address], [place], [place] [address]   Wet prep, genital     Status: Abnormal   Collection Time: 04/01/19 10:47 PM   Specimen: Cervix  Result Value Ref Range   Yeast Wet Prep HPF POC NONE SEEN NONE SEEN   Trich, Wet Prep NONE SEEN NONE SEEN   Clue Cells Wet Prep HPF POC PRESENT (A) NONE SEEN   WBC, Wet Prep HPF POC MANY (A) NONE SEEN   Sperm NONE SEEN     Comment: Performed at [facility], [address], [place], [place] [address]  Urinalysis, Routine w reflex microscopic     Status: Abnormal   Collection Time: 04/01/19 10:47 PM  Result Value Ref Range   Color, Urine AMBER (A) YELLOW    Comment: BIOCHEMICALS MAY BE AFFECTED BY COLOR   APPearance CLOUDY (A) CLEAR  Specific Gravity, Urine 1.026 1.005 - 1.030   pH 6.0 5.0 - 8.0   Glucose, UA NEGATIVE NEGATIVE mg/dL   Hgb urine dipstick SMALL (A) NEGATIVE   Bilirubin Urine NEGATIVE NEGATIVE   Ketones, ur NEGATIVE NEGATIVE mg/dL   Protein, ur 812 (A) NEGATIVE mg/dL   Nitrite NEGATIVE NEGATIVE   Leukocytes,Ua LARGE (A) NEGATIVE   RBC / HPF 6-10 0 - 5 RBC/hpf   WBC, UA 21-50 0 - 5 WBC/hpf   Bacteria, UA RARE (A) NONE SEEN   Squamous Epithelial / LPF 21-50 0 - 5   Mucus PRESENT    Ca Oxalate Crys, UA PRESENT    Non Squamous Epithelial 0-5 (A) NONE SEEN    Comment: Performed at [facility], [address] [place], [place] [address]  Troponin I (High Sensitivity)     Status: None   Collection Time:  04/01/19 11:20 PM  Result Value Ref Range   Troponin I (High Sensitivity) 3 <18 ng/L    Comment: (NOTE) Elevated high sensitivity troponin I (hsTnI) values and significant  changes across serial measurements may suggest ACS but many other  chronic and acute conditions are known to elevate hsTnI results.  Refer to the "Links" section for chest pain algorithms and additional  guidance. Performed at [facility], [address] [place], [place] [address]   Brain natriuretic peptide     Status: None   Collection Time: 04/01/19 11:20 PM  Result Value Ref Range   B Natriuretic Peptide 24.2 0.0 - 100.0 pg/mL    Comment: Performed at [facility], [address], [place], [place] [address]  CBC     Status: Abnormal   Collection Time: 04/01/19 11:20 PM  Result Value Ref Range   WBC 12.0 (H) 4.0 - 10.5 K/uL   RBC 4.25 3.87 - 5.11 MIL/uL   Hemoglobin 12.0 12.0 - 15.0 g/dL   HCT 59.1 63.8 - 46.6 %   MCV 84.7 80.0 - 100.0 fL   MCH 28.2 26.0 - 34.0 pg   MCHC 33.3 30.0 - 36.0 g/dL   RDW 59.9 35.7 - 01.7 %   Platelets 180 150 - 400 K/uL   nRBC 0.0 0.0 - 0.2 %    Comment: Performed at [facility], [address] [place], [place] [address]  Comprehensive metabolic panel     Status: Abnormal   Collection Time: 04/01/19 11:20 PM  Result Value Ref Range   Sodium 134 (L) 135 - 145 mmol/L   Potassium 3.6 3.5 - 5.1 mmol/L   Chloride 103 98 - 111 mmol/L   CO2 20 (L) 22 - 32 mmol/L   Glucose, Bld 85 70 - 99 mg/dL   BUN 8 6 - 20 mg/dL   Creatinine, Ser 3.00 0.44 - 1.00 mg/dL   Calcium 9.1 8.9 - 92.3 mg/dL   Total Protein 6.7 6.5 - 8.1 g/dL   Albumin 3.4 (L) 3.5 - 5.0 g/dL   AST 17 15 - 41 U/L   ALT 15 0 - 44 U/L   Alkaline Phosphatase 119 38 - 126 U/L   Total Bilirubin 0.5 0.3 - 1.2 mg/dL   GFR calc non Af Amer >60 >60 mL/min   GFR calc Af Amer >60 >60 mL/min   Anion gap 11 5 - 15    Comment: Performed at [facility] Lab, [address], [place], [place] [address]   Type and screen [facility]  Status: None   Collection Time: 04/02/19  2:00 AM  Result Value Ref Range   ABO/RH(D) O POS    Antibody Screen NEG    Sample Expiration      04/05/2019,2359 Performed at [facility], [address] [place], [place] [address]   Hepatitis B surface antigen     Status: None   Collection Time: 04/02/19  2:00 AM  Result Value Ref Range   Hepatitis B Surface Ag NON REACTIVE NON REACTIVE    Comment: Performed at [facility], [address] [place], [place] [address]  RPR     Status: None   Collection Time: 04/02/19  2:00 AM  Result Value Ref Range   RPR Ser Ql NON REACTIVE NON REACTIVE    Comment: Performed at [facility], [address] [place], [place] [address]  HIV Antibody (routine testing w rflx)     Status: None   Collection Time: 04/02/19  2:00 AM  Result Value Ref Range   HIV Screen 4th Generation wRfx NON REACTIVE NON REACTIVE    Comment: Performed at [facility], [address] [place], [place] [address]  ABO/Rh     Status: None   Collection Time: 04/02/19  2:00 AM  Result Value Ref Range   ABO/RH(D)      O POS Performed at [facility], [address] [place], [place] [address]   SARS Coronavirus 2 by RT PCR (hospital order, performed in [facility] hospital lab) Nasopharyngeal Nasopharyngeal Swab     Status: None   Collection Time: 04/02/19  8:00 AM   Specimen: Nasopharyngeal Swab  Result Value Ref Range   SARS Coronavirus 2 NEGATIVE NEGATIVE    Comment: (NOTE) If result is NEGATIVE SARS-CoV-2 target nucleic acids are NOT DETECTED. The SARS-CoV-2 RNA is generally detectable in upper and lower  respiratory specimens during the acute phase of infection. The lowest  concentration of SARS-CoV-2 viral copies this assay can detect is 250  copies / mL. A negative result does not preclude SARS-CoV-2 infection  and should not be used as the sole basis for treatment or other  patient  management decisions.  A negative result may occur with  improper specimen collection / handling, submission of specimen other  than nasopharyngeal swab, presence of viral mutation(s) within the  areas targeted by this assay, and inadequate number of viral copies  (<250 copies / mL). A negative result must be combined with clinical  observations, patient history, and epidemiological information. If result is POSITIVE SARS-CoV-2 target nucleic acids are DETECTED. The SARS-CoV-2 RNA is generally detectable in upper and lower  respiratory specimens dur ing the acute phase of infection.  Positive  results are indicative of active infection with SARS-CoV-2.  Clinical  correlation with patient history and other diagnostic information is  necessary to determine patient infection status.  Positive results do  not rule out bacterial infection or co-infection with other viruses. If result is PRESUMPTIVE POSTIVE SARS-CoV-2 nucleic acids MAY BE PRESENT.   A presumptive positive result was obtained on the submitted specimen  and confirmed on repeat testing.  While 2019 novel coronavirus  (SARS-CoV-2) nucleic acids may be present in the submitted sample  additional confirmatory testing may be necessary for epidemiological  and / or clinical management purposes  to differentiate between  SARS-CoV-2 and other Sarbecovirus currently known to infect humans.  If clinically indicated additional testing with an alternate test  methodology [phone] is  advised. The SARS-CoV-2 RNA is generally  detectable in upper and lower respiratory sp ecimens during the acute  phase of infection. The expected result is Negative. Fact Sheet for Patients:  StrictlyIdeas.no Fact Sheet for Healthcare Providers: BankingDealers.co.za This test is not yet approved or cleared by the [place] FDA and has been authorized for detection and/or diagnosis of SARS-CoV-2 by FDA under  an Emergency Use Authorization (EUA).  This EUA will remain in effect (meaning this test can be used) for the duration of the COVID-19 declaration under Section 564(b)(1) of the Act, 21 U.S.C. section 360bbb-3(b)(1), unless the authorization is terminated or revoked sooner. Performed at [facility], [place] [address], [place], [place] [address]     Dg Chest 1 View  Result Date: 04/01/2019 CLINICAL DATA:  Chest pain. Pregnant patient in third trimester pregnancy. EXAM: CHEST  1 VIEW COMPARISON:  02/11/2016 FINDINGS: The cardiomediastinal contours are normal. The lungs are clear. Pulmonary vasculature is normal. No consolidation, pleural effusion, or pneumothorax. No acute osseous abnormalities are seen. IMPRESSION: Normal chest radiograph. Electronically Signed   By: Keith Rake M.D.   On: 04/01/2019 23:53    Current scheduled medications . betamethasone acetate-betamethasone sodium phosphate  12 mg Intramuscular Q24H  . buprenorphine-naloxone  2 tablet Sublingual BID  . docusate sodium  100 mg Oral Daily  . metroNIDAZOLE  500 mg Oral Q12H  . prenatal multivitamin  1 tablet Oral Q1200  . valACYclovir  500 mg Oral BID    I have reviewed the patient's current medications.  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 Will remain in house until at least 7 days without bleeding.  Acutely undergoing heroin withdrawal, desires medication for management.  Dr. Loma Boston reviewed the chart and helped to re-start Suboxone therapy (she was on this last pregnancy).  Started on 4 mg po bid, may need to increase to tid based  on symptoms.  Will continue to monitor closely.  Continue Rocephin for UTI. Metronidazole ordered for BV. Valtrex ordered for suppression given history of genital HSV (no current symptoms). Reassuring FHT tracing, NST bid. Continue betamethasone. Continue routine antenatal care.   Verita Schneiders, MD, [place] for [employer], [place]

## 2019-04-02 NOTE — MAU Note (Signed)
Covid swab obtained without difficulty and pt tol well. No symptoms 

## 2019-04-02 NOTE — Consult Note (Signed)
MFM Consult  This patient was seen in consultation at the request of Dr. Harolyn Rutherford, due to maternal drug use, abdominal pain, and vaginal bleeding.  The patient is a 28 year old gravida 6 para 3-0-2-3 with a long history of drug abuse.  The patient reports that she snorted heroin yesterday that may have been laced with fentanyl.  She began experiencing abdominal pain last night and was admitted to the hospital.  She has a history of a drug overdose on September 25 at which time she had to be resuscitated secondary to cardiac arrest.  The patient reports that she may have been experiencing withdrawal symptoms earlier today.  She was started on Suboxone this afternoon.  She continues to note lower abdominal cramping.  Due to abdominal pain and vaginal bleeding, she is currently receiving a complete course of antenatal corticosteroids.  The patient reports that she has had limited prenatal care in her current pregnancy.  She reports 3 prior normal spontaneous vaginal deliveries.  Her cervix on admission was 1 cm dilated.  On an ultrasound performed today, the overall EFW was 4 pounds 11 ounces (22nd percentile for her gestational age).  The amniotic fluid level was within normal limits with an AFI of 11.8 cm.  An anterior placenta is noted with no signs of a retroplacental clot noted.  Her fetal heart rate tracing is reactive.  There were no contractions noted on the toco.  The patient was advised that the cause of her lower abdominal pain remains undetermined.  Her abdominal pain may be related to an acute withdrawal from the drugs that she was using.  My suspicion for a placenta abruption is low. She should continue to be treated with Suboxone for the remainder of her pregnancy.  Due to vaginal bleeding, she should be observed in the hospital for 7 days with daily fetal testing.  She should receive the second dose of betamethasone.  The patient understands that due to drug use in pregnancy, her baby may require  prolonged hospitalization to be weaned off of opioids after delivery.  The patient was encouraged to avoid and discontinue illegal drug use after she is discharged.  At the end of the consultation, the patient stated that all of her questions had been answered to her complete satisfaction.    Recommendations: Complete course of antenatal corticosteroids Observation in the hospital for the next week with daily fetal testing Continue Suboxone for the remainder of her pregnancy Weekly fetal testing as an outpatient once she is discharged

## 2019-04-02 NOTE — Progress Notes (Signed)
CSW acknowledged consult and attempted to meet with patient, however patient was in ultrasound. CSW spoke with patient's RN who reported that patient will be admitted inpatient for seven days. CSW will meet with patient on Monday to complete psychosocial assessment and to provide substance abuse treatment resources.   Abundio Miu, [place] [facility] Cell#: [phone]

## 2019-04-02 NOTE — H&P (Signed)
Chief Complaint:  Abdominal Pain and Vaginal Bleeding   Provider saw patient at 2230  HPI: Brittney Nicholson is a 28 y.o. [id number] at 63w5dwho presents to maternity admissions reporting severe abdominal pain and bleeding.  Started about 4 hours ago.  . She reports good fetal movement, denies LOF, vaginal itching/burning, urinary symptoms, h/a, dizziness, n/v, diarrhea, constipation or fever/chills.  Denies recent drug or alcohol use  Has had no prenatal care this pregnancy.  Patient states she had care in June.   History of recent drug overdose (03/05/19) with cardiac arrest requiring CPR History of 3 prior vaginal deliveries, uncomplicated ?hx HSV   RN Note: Brittney Nicholson is a 28 y.o. at [redacted]w[redacted]d here in MAU reporting: Abdominal pain and VB.  Onset of complaint: 1800 Pain score: 7 FHT:130 Lab orders placed from triage:  Past Medical History: Past Medical History:  Diagnosis Date  . Anxiety   . Depression   . 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 Headache    with this pregnancy  . HSV-2 infection complicating pregnancy   . Opiate use   . Pneumonia   . Pregnant 12/13/2014  . Syncope   . Warts     Past obstetric history: OB History  Gravida Para Term Preterm AB Living  6 3 3  0 2 3  SAB TAB Ectopic Multiple Live Births  2 0 0 0 3    # Outcome Date GA Lbr Len/2nd Weight Sex Delivery Anes PTL Lv  6 Current           5 Term 06/20/18 [redacted]w[redacted]d  3070 g F Vag-Spont EPI  LIV  4 Term 07/28/15 [redacted]w[redacted]d 10:20 / 00:04 3549 g F Vag-Spont EPI N LIV  3 Term 11/08/12 [redacted]w[redacted]d 01:36 / 14:15 4034 g F Vag-Spont EPI N LIV     Birth Comments: WNL  2 SAB           1 SAB             Past Surgical History: Past Surgical History:  Procedure Laterality Date  . BREAST LUMPECTOMY  2006  . CHOLECYSTECTOMY N/A 01/25/2013   Procedure: LAPAROSCOPIC CHOLECYSTECTOMY;  Surgeon: 01/27/2013, MD;  Location: [facility];  Service: General;  Laterality: N/A;  . right arm Right    has pins.  . TONSILLECTOMY    .  TONSILLECTOMY  2011    Family History: Family History  Problem Relation Age of Onset  . Cancer Mother        uterine  . Diabetes Father   . Kidney failure Father     Social History: Social History   Tobacco Use  . Smoking status: Former Smoker    Packs/day: 1.00    Types: Cigarettes  . Smokeless tobacco: Never Used  . Tobacco comment: Quit September 2019  Substance Use Topics  . Alcohol use: No  . Drug use: Not Currently    Types: Heroin    Comment: last use of heroin 24Jul 2019    Allergies:  Allergies  Allergen Reactions  . Gluten Other (See Comments)    RECTAL BLEEDING  . Wheat Other (See Comments)    Rectal bleeding    Meds:  Medications Prior to Admission  Medication Sig Dispense Refill Last Dose  . Prenatal Vit-Fe Fumarate-FA (MULTIVITAMIN-PRENATAL) 27-0.8 MG TABS tablet Take 1 tablet by mouth daily at 12 noon.   04/01/2019 at Unknown time  . buprenorphine (SUBUTEX) 8 MG SUBL SL tablet Place 4 mg under the tongue 3 (three)  times daily.   More than a month at Unknown time  . ibuprofen (ADVIL,MOTRIN) 600 MG tablet Take 1 tablet (600 mg total) by mouth every 6 (six) hours. 30 tablet 0     I have reviewed patient's Past Medical Hx, Surgical Hx, Family Hx, Social Hx, medications and allergies.   ROS:  Review of Systems  Constitutional: Negative for chills and fever.  Respiratory: Negative for shortness of breath.   Cardiovascular: Positive for chest pain. Negative for leg swelling.  Gastrointestinal: Positive for abdominal pain. Negative for constipation, diarrhea and nausea.  Genitourinary: Positive for frequency, pelvic pain and vaginal bleeding. Negative for dysuria.  Neurological: Negative for dizziness and weakness.  Psychiatric/Behavioral:       Drug addiction    Other systems negative  Physical Exam   Patient Vitals for the past 24 hrs:  BP Temp Temp src Pulse Resp  04/01/19 2246 (!) 114/56 - - 85 -  04/01/19 2239 (!) 111/51 - - 88 -   04/01/19 2233 (!) 136/112 98 F (36.7 C) Oral 96 20   Constitutional: Well-developed, well-nourished female in no acute distress.  Cardiovascular: normal rate and rhythm Respiratory: normal effort, clear to auscultation bilaterally GI: Abd soft, non-tender, gravid appropriate for gestational age.   No rebound or guarding.       Fundal height = 36-37cm MS: Extremities nontender, no edema, normal ROM Neurologic: Alert and oriented x 4.  GU: Neg CVAT.  PELVIC EXAM: Cervix pink, visually closed, without lesion, small  white creamy discharge, vaginal walls and external genitalia normal   No blood visible in vagina  >> subsequent exam there was blood in vault  Dilation: 1 Effacement (%): 30 Station: -2 Presentation: Vertex Exam by:: Wynelle BourgeoisMarie Tiyona Desouza, CNM  FHT:  Baseline 140 , moderate variability, accelerations present, no decelerations Contractions: Frequent, Irregular, short x 30 seconds each    Labs: --/--/O POS (01/10 1304) Results for orders placed or performed during the hospital encounter of 04/01/19 (from the past 24 hour(s))  Urine rapid drug screen (hosp performed)     Status: Abnormal   Collection Time: 04/01/19 10:47 PM  Result Value Ref Range   Opiates POSITIVE (A) NONE DETECTED   Cocaine NONE DETECTED NONE DETECTED   Benzodiazepines NONE DETECTED NONE DETECTED   Amphetamines NONE DETECTED NONE DETECTED   Tetrahydrocannabinol NONE DETECTED NONE DETECTED   Barbiturates NONE DETECTED NONE DETECTED  Wet prep, genital     Status: Abnormal   Collection Time: 04/01/19 10:47 PM   Specimen: Cervix  Result Value Ref Range   Yeast Wet Prep HPF POC NONE SEEN NONE SEEN   Trich, Wet Prep NONE SEEN NONE SEEN   Clue Cells Wet Prep HPF POC PRESENT (A) NONE SEEN   WBC, Wet Prep HPF POC MANY (A) NONE SEEN   Sperm NONE SEEN   Urinalysis, Routine w reflex microscopic     Status: Abnormal   Collection Time: 04/01/19 10:47 PM  Result Value Ref Range   Color, Urine AMBER (A) YELLOW    APPearance CLOUDY (A) CLEAR   Specific Gravity, Urine 1.026 1.005 - 1.030   pH 6.0 5.0 - 8.0   Glucose, UA NEGATIVE NEGATIVE mg/dL   Hgb urine dipstick SMALL (A) NEGATIVE   Bilirubin Urine NEGATIVE NEGATIVE   Ketones, ur NEGATIVE NEGATIVE mg/dL   Protein, ur 272100 (A) NEGATIVE mg/dL   Nitrite NEGATIVE NEGATIVE   Leukocytes,Ua LARGE (A) NEGATIVE   RBC / HPF 6-10 0 - 5 RBC/hpf   WBC,  UA 21-50 0 - 5 WBC/hpf   Bacteria, UA RARE (A) NONE SEEN   Squamous Epithelial / LPF 21-50 0 - 5   Mucus PRESENT    Ca Oxalate Crys, UA PRESENT    Non Squamous Epithelial 0-5 (A) NONE SEEN  Troponin I (High Sensitivity)     Status: None   Collection Time: 04/01/19 11:20 PM  Result Value Ref Range   Troponin I (High Sensitivity) 3 <18 ng/L  Brain natriuretic peptide     Status: None   Collection Time: 04/01/19 11:20 PM  Result Value Ref Range   B Natriuretic Peptide 24.2 0.0 - 100.0 pg/mL  CBC     Status: Abnormal   Collection Time: 04/01/19 11:20 PM  Result Value Ref Range   WBC 12.0 (H) 4.0 - 10.5 K/uL   RBC 4.25 3.87 - 5.11 MIL/uL   Hemoglobin 12.0 12.0 - 15.0 g/dL   HCT 40.9 81.1 - 91.4 %   MCV 84.7 80.0 - 100.0 fL   MCH 28.2 26.0 - 34.0 pg   MCHC 33.3 30.0 - 36.0 g/dL   RDW 78.2 95.6 - 21.3 %   Platelets 180 150 - 400 K/uL   nRBC 0.0 0.0 - 0.2 %  Comprehensive metabolic panel     Status: Abnormal   Collection Time: 04/01/19 11:20 PM  Result Value Ref Range   Sodium 134 (L) 135 - 145 mmol/L   Potassium 3.6 3.5 - 5.1 mmol/L   Chloride 103 98 - 111 mmol/L   CO2 20 (L) 22 - 32 mmol/L   Glucose, Bld 85 70 - 99 mg/dL   BUN 8 6 - 20 mg/dL   Creatinine, Ser 0.86 0.44 - 1.00 mg/dL   Calcium 9.1 8.9 - 57.8 mg/dL   Total Protein 6.7 6.5 - 8.1 g/dL   Albumin 3.4 (L) 3.5 - 5.0 g/dL   AST 17 15 - 41 U/L   ALT 15 0 - 44 U/L   Alkaline Phosphatase 119 38 - 126 U/L   Total Bilirubin 0.5 0.3 - 1.2 mg/dL   GFR calc non Af Amer >60 >60 mL/min   GFR calc Af Amer >60 >60 mL/min   Anion gap 11 5 - 15     Imaging:  Dg Chest 1 View  Result Date: 04/01/2019 CLINICAL DATA:  Chest pain. Pregnant patient in third trimester pregnancy. EXAM: CHEST  1 VIEW COMPARISON:  02/11/2016 FINDINGS: The cardiomediastinal contours are normal. The lungs are clear. Pulmonary vasculature is normal. No consolidation, pleural effusion, or pneumothorax. No acute osseous abnormalities are seen. IMPRESSION: Normal chest radiograph. Electronically Signed   By: Narda Rutherford M.D.   On: 04/01/2019 23:53   Limited OB US Oligohydramnios, 8.25cm, 6%ile Anterior placenta, sign of abruption EFW:  [redacted]w[redacted]d, Best EDC [redacted]w[redacted]d  MAU Course/MDM: I have ordered labs and reviewed results.  NST reviewed, reassuring with irregular contractions Consult Dr Jolayne Panther with presentation, exam findings and test results.   Workup for chest pain was negative.  EKG showed normal sinus rhythm, CXR was normal   Troponin and BNP                                                              were both normal . Suspect may be chest wall pain OB workup:  Dates  verified by limited US, new oligohydramnios discovered.  Exam negative for PPROM (no pooling and Fern Negative)    No sign of abruption on [place].   Irregular contractions treated with IV fluids and Procardia x 1 per Dr Elly Modena.   UA suspicious for UTI, Rocephin ordered.  Pyridium given for urinary frequency.   OB panel and other labs ordered.    UDS positive for opiates.   Assessment: 1. Chest pain   2. Preterm labor   3. Vaginal bleeding in pregnancy, third trimester   4.     Probable UTI 5.      Oligohydramnios  Plan: Admit to [facility] for unexplained third trimester bleeding Routine orders New OB labs OB US MFM Detail tomorrow Rocephin q 24 hrs until urine culture resulted IV hydration Neonatology consult for PTL and Opiod use planning MFM consult for 3rd trimester bleeding and Oligohydramnios MD team to follow.  Hansel Feinstein CNM, MSN Certified  Nurse-Midwife 04/01/2019 11:56 PM

## 2019-04-03 DIAGNOSIS — Z3A33 33 weeks gestation of pregnancy: Secondary | ICD-10-CM | POA: Diagnosis not present

## 2019-04-03 DIAGNOSIS — O4693 Antepartum hemorrhage, unspecified, third trimester: Secondary | ICD-10-CM | POA: Diagnosis not present

## 2019-04-03 LAB — RUBELLA SCREEN: Rubella: 3.77 index (ref >0.99)

## 2019-04-03 MED ORDER — BUPRENORPHINE HCL-NALOXONE HCL 2-0.5 MG SL SUBL
2.0000 | SUBLINGUAL_TABLET | Freq: Three times a day (TID) | SUBLINGUAL | Status: DC
  Administered 2019-04-03 – 2019-04-09 (×19): 2 via SUBLINGUAL
  Filled 2019-04-03 (×14): qty 2
  Filled 2019-04-03: qty 3
  Filled 2019-04-03 (×4): qty 2

## 2019-04-03 NOTE — Consult Note (Signed)
Neonatology Consult to Antenatal Patient:  I was asked by Dr. Harolyn Rutherford to see this patient in order to provide antenatal counseling due to prematurity .  Ms. Latif was admitted 04/02/19 at 33.[redacted] weeks EGA with concerns for vaginal bleeding. She is currently not having active labor and bleeding concerns are much lower. Mother with limited [place] and significant psychosocial issues impacting pregnancy including + long h/o drug use (UDS + opiates (admitted to recent heroin and ?fentanyl, now on prescribed Suboxone) and recent o/d with cardiac arrest) plus involvement with law enforcement.  Also UTI during pregnancy, HSV, warts and rubella non-immune.  She got BTMZ #2 10/24 and is on flagyl, valtrex, rocephin and procardia.  I spoke with the patient and FOB. We discussed the worst case of delivery in the next 1-2 days, including usual DR management, possible respiratory complications and need for support, IV access, feedings including potential use of donor breast milk, LOS, Mortality and Morbidity, and long term outcomes. Baby may also experience NAS which will impact inpatient care and potentially long term developmental outcomes; she was familiar with ESC method for management of previous baby.  She did have questions at this time which I answered. I/we would be glad to come back if she has more questions later.  Thank you for asking me to see this patient.  Monia Sabal Katherina Mires, MD Neonatologist  The total length of face-to-face or floor/unit time for this encounter was 25 minutes. Counseling and/or coordination of care was 40 minutes of the above.

## 2019-04-03 NOTE — Progress Notes (Signed)
[place]) NOTE  LAVAYA DEFREITAS is a 28 y.o. [phone] at [redacted]w[redacted]d by best clinical estimate, third tri u/s who is admitted for 3rd tri bleeding occurring while walking/hitchhiking home from having ankle bracelet battery check, brought to hospital. Initally arrested but that has been dropped. Still has ankle bracelet. . Pt reports she lives with her mother in [place], Mother is caregiver to children, Did not discuss FOB. Fetal presentation is cephalic. No previa or abruption seen Length of Stay:  1  Days  Subjective: Pt reports shakiness x 3-4 hours before next dose of Suboxone.  She reports 2 yr hx heroin use, prior Opiate use. Patient reports the fetal movement as active. Patient reports uterine contraction  activity as none. Patient reports  vaginal bleeding as none. Patient describes fluid per vagina as None.  Vitals:  Blood pressure (!) 102/46, pulse 72, temperature 98.3 F (36.8 C), temperature source Oral, resp. rate 18, height 5\' 7"  (1.702 m), weight 72.1 kg, last menstrual period 09/09/2018, SpO2 100 %, currently breastfeeding. Physical Examination:  General appearance - alert, well appearing, and in no distress, oriented to person, place, and time, normal appearing weight and anxious Heart - normal rate and regular rhythm Abdomen - soft, nontender, nondistended Fundal Height:  size equals dates Cervical Exam: Not evaluated. Extremities: extremities normal, atraumatic, no cyanosis or edema and Homans sign is negative, no sign of DVT with DTRs 2+ bilaterally Membranes:intact  Fetal Monitoring:  Baseline: 130 bpm, Variability: Good {> 6 bpm), Accelerations: Reactive and Decelerations: Absent  Labs:  Results for orders placed or performed during the hospital encounter of 04/01/19 (from the past 24 hour(s))  SARS Coronavirus 2 by RT PCR (hospital order, performed in [place] hospital lab) Nasopharyngeal Nasopharyngeal Swab   Collection Time:  04/02/19  8:00 AM   Specimen: Nasopharyngeal Swab  Result Value Ref Range   SARS Coronavirus 2 NEGATIVE NEGATIVE    Imaging Studies:    ---------------------------------------------------------------------- Comments  This patient was seen in consultation at the request of Dr.  Harolyn Rutherford, due to maternal drug use, abdominal pain, and  vaginal bleeding.  The patient is a 28 year old gravida 6 para  3-0-2-3 with a long history of drug abuse.  The patient  reports that she snorted heroin yesterday that may have  been laced with fentanyl.  She began experiencing abdominal  pain last night and was admitted to the hospital.  She has a  history of a drug overdose on September 25 at which time  she had to be resuscitated secondary to cardiac arrest.  The  patient reports that she may have been experiencing  withdrawal symptoms earlier today.  She was started on  Suboxone this afternoon.  She continues to note lower  abdominal cramping.  Due to abdominal pain and vaginal  bleeding, she is currently receiving a complete course of  antenatal corticosteroids.  The patient reports that she has  had limited prenatal care in her current pregnancy.  She  reports 3 prior normal spontaneous vaginal deliveries.  Her  cervix on admission was 1 cm dilated.  On an ultrasound performed today, the overall EFW was 4  pounds 11 ounces (22nd percentile for her gestational age).  The amniotic fluid level was within normal limits with an AFI  of 11.8 cm.  An anterior placenta is noted with no signs of a  retroplacental clot noted.  Her fetal heart rate tracing is  reactive.  There were no contractions noted on the toco.  The patient was advised that the cause of her lower  abdominal pain remains undetermined.  Her abdominal pain  may be related to an acute withdrawal from the drugs that  she was using.  My suspicion for a placenta abruption is low.  She should continue to be treated with Suboxone for the   remainder of her pregnancy.  Due to vaginal bleeding, she  should be observed in the hospital for 7 days with daily fetal  testing.  She should receive the second dose of  betamethasone.  The patient understands that due to drug  use in pregnancy, her baby may require prolonged  hospitalization to be weaned off of opioids after delivery.  The patient was encouraged to avoid and discontinue illegal  drug use after she is discharged.  At the end of the consultation, the patient stated that all of  her questions had been answered to her complete  satisfaction. ---------------------------------------------------------------------- Recommendations  Complete course of antenatal corticosteroids  Observation in the hospital for the next week with daily fetal  testing  Continue Suboxone for the remainder of her pregnancy  Weekly fetal testing as an outpatient once she is discharge ----------------------------------------------------------------------                        Ma Rings, MD Electronically Signed Corrected Final Report  04/02/2019 04:46 pm ----------------------------------------------------------------------   Medications:  Scheduled . buprenorphine-naloxone  2 tablet Sublingual TID  . docusate sodium  100 mg Oral Daily  . metroNIDAZOLE  500 mg Oral Q12H  . prenatal multivitamin  1 tablet Oral Q1200  . valACYclovir  500 mg Oral BID   I have reviewed the patient's current medications.  ASSESSMENT: Patient Active Problem List   Diagnosis Date Noted  . Vaginal bleeding in pregnancy, third trimester 04/02/2019  . 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 BV (bacterial vaginosis) 04/02/2019  . Drug use affecting pregnancy in third trimester   . Limited prenatal care in third trimester   . [redacted] weeks gestation of pregnancy   . Pregnancy complicated by subutex maintenance, antepartum ([facility]) 05/11/2018  . Rubella non-immune status, antepartum 03/19/2018  .  Opiate use 03/12/2018  . 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 Depression with anxiety 03/12/2018  . UTI (urinary tract infection) during pregnancy, second trimester 03/12/2018  . 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 Warts 08/18/2012    PLAN: Increase suboxone to 2 mg/0.5 mg TID from bid. inpt care x 7 d from bleeding Pt could followup at [facility], as she is from [place]. Weekly BPP or NST's after d/c. Will need to coordinate Suboxone management, as neither provider at [facility] can Rx Suboxone.  Tilda Burrow 04/03/2019,6:47 AM    Patient ID: Gregary Cromer, female   DOB: 07-12-1990, 28 y.o.   MRN: [id number]

## 2019-04-04 DIAGNOSIS — Z3A33 33 weeks gestation of pregnancy: Secondary | ICD-10-CM | POA: Diagnosis not present

## 2019-04-04 DIAGNOSIS — O4103X0 Oligohydramnios, third trimester, not applicable or unspecified: Secondary | ICD-10-CM | POA: Diagnosis not present

## 2019-04-04 NOTE — Progress Notes (Signed)
Patient ID: Brittney Nicholson, female   DOB: 07-Feb-1991, 28 y.o.   MRN: [id number] [place]) NOTE  Brittney Nicholson is a 28 y.o. [id number] at [redacted]w[redacted]d who is admitted for third trimester vaginal bleeding.   Fetal presentation is cephalic. Length of Stay:  2  Days  Subjective: No bleeding Patient reports the fetal movement as active. Patient reports uterine contraction  activity as none. Patient reports  vaginal bleeding as none. Patient describes fluid per vagina as None.  Vitals:  Blood pressure (!) 99/45, pulse 64, temperature 98.4 F (36.9 C), temperature source Oral, resp. rate 16, height 5\' 7"  (1.702 m), weight 72.1 kg, last menstrual period 09/09/2018, SpO2 100 %, currently breastfeeding. Physical Examination:  General appearance - alert, well appearing, and in no distress Heart - normal rate and regular rhythm Abdomen - soft, nontender, nondistended Fundal Height:  size equals dates Cervical Exam: Not evaluated.  Extremities: extremities normal, atraumatic, no cyanosis or edema and Homans sign is negative, no sign of DVT Membranes:intact Fetal Heart Rate A  Mode External filed at 04/03/2019 2306  Baseline Rate (A) 130 bpm filed at 04/03/2019 2306  Variability <5 BPM, 6-25 BPM filed at 04/03/2019 2306  Accelerations 10 x 10, 15 x 15 filed at 04/03/2019 2306  Decelerations Variable filed at 04/03/2019 2306  Multiple birth? N filed at 04/02/2019 1538    Fetal Monitoring:   Labs:  No results found for this or any previous visit (from the past 24 hour(s)).   Medications:  Scheduled . buprenorphine-naloxone  2 tablet Sublingual TID  . docusate sodium  100 mg Oral Daily  . metroNIDAZOLE  500 mg Oral Q12H  . prenatal multivitamin  1 tablet Oral Q1200  . valACYclovir  500 mg Oral BID   I have reviewed the patient's current medications.  ASSESSMENT: Patient Active Problem List   Diagnosis Date Noted  . Vaginal bleeding in pregnancy, third  trimester 04/02/2019  . 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 BV (bacterial vaginosis) 04/02/2019  . Drug use affecting pregnancy in third trimester   . Limited prenatal care in third trimester   . [redacted] weeks gestation of pregnancy   . Pregnancy complicated by subutex maintenance, antepartum ([place]) 05/11/2018  . Rubella non-immune status, antepartum 03/19/2018  . Opiate use 03/12/2018  . 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 Depression with anxiety 03/12/2018  . UTI (urinary tract infection) during pregnancy, second trimester 03/12/2018  . 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 Warts 08/18/2012    PLAN: Continue present management for bleeding, hospitalize for at least 7 days  Emeterio Reeve 04/04/2019,7:03 AM

## 2019-04-05 DIAGNOSIS — Z3A33 33 weeks gestation of pregnancy: Secondary | ICD-10-CM | POA: Diagnosis not present

## 2019-04-05 DIAGNOSIS — O4693 Antepartum hemorrhage, unspecified, third trimester: Secondary | ICD-10-CM | POA: Diagnosis not present

## 2019-04-05 LAB — TYPE AND SCREEN
ABO/RH(D): O POS
Antibody Screen: NEGATIVE

## 2019-04-05 NOTE — Progress Notes (Signed)
[place] NOTE  MYLISSA LAMBE is a 28 y.o. [id number] at [redacted]w[redacted]d who is admitted for vaginal bleeding.  Estimated Date of Delivery: 05/15/19 Fetal presentation is cephalic.  Length of Stay:  3 Days. Admitted 04/01/2019  Subjective:  Patient reports normal fetal movement.  She denies uterine contractions, denies bleeding and leaking of fluid per vagina.  Vitals:  Blood pressure (!) 84/39, pulse 75, temperature 98.4 F (36.9 C), temperature source Oral, resp. rate 16, height 5\' 7"  (1.702 m), weight 72.1 kg, last menstrual period 09/09/2018, SpO2 100 %, currently breastfeeding. Physical Examination: CONSTITUTIONAL: Well-developed, well-nourished female in no acute distress.  HENT:  Normocephalic, atraumatic, External right and left ear normal. Oropharynx is clear and moist EYES: Conjunctivae and EOM are normal. Pupils are equal, round, and reactive to light. No scleral icterus.  NECK: Normal range of motion, supple, no masses. SKIN: Skin is warm and dry. No rash noted. Not diaphoretic. No erythema. No pallor. [place]: Alert and oriented to person, place, and time. Normal reflexes, muscle tone coordination. No cranial nerve deficit noted. PSYCHIATRIC: Normal mood and affect. Normal behavior. Normal judgment and thought content. CARDIOVASCULAR: Normal heart rate noted RESPIRATORY: Effort normal, no problems with respiration noted MUSCULOSKELETAL: Normal range of motion. No edema and no tenderness. ABDOMEN: Soft, nontender, nondistended, gravid. CERVIX: deferred  Fetal monitoring: FHR: 130 bpm, Variability: moderate, Accelerations: Present, Decelerations: Absent  Uterine activity: no contractions per hour  Results for orders placed or performed during the hospital encounter of 04/01/19 (from the past 48 hour(s))  Type and screen [place]     Status: None   Collection Time: 04/05/19  5:45 AM  Result Value Ref Range   ABO/RH(D) O POS    Antibody Screen NEG    Sample Expiration      04/08/2019,2359 Performed at [facility] [address] [place], [place] [address]     I have reviewed the patient's current medications.  ASSESSMENT: Principal Problem:   Vaginal bleeding in pregnancy, third trimester Active Problems:   Genital HSV   UTI (urinary tract infection) during pregnancy, second trimester   Pregnancy complicated by subutex maintenance, antepartum ([place])   Heroin use affecting pregnancy in third trimester   BV (bacterial vaginosis)   Drug use affecting pregnancy in third trimester   Limited prenatal care in third trimester   [redacted] weeks gestation of pregnancy   PLAN: No further bleeding Inpatient management x 7 days S/p BTMZ Cont flagyl Cont suboxone   Continue routine antenatal care.   Feliz Beam, M.D. Attending [facility] [employer] [facility] (Faculty Practice)  04/05/2019 7:43 AM

## 2019-04-05 NOTE — Progress Notes (Signed)
CSW met with patient at bedside to discuss substance abuse treatment resources, patient's boyfriend present. CSW asked patient's boyfriend to leave the room to speak with patient privately, boyfriend left voluntarily. CSW introduced self and explained reason for consult. Patient was welcoming, pleasant and engaged during conversation. Patient reported that she currently resides with her mother and is not employed. CSW inquired about patient's support system, patient reported that her boyfriend is her only support. CSW inquired if patient's mother is a support for her, patient reported no. Patient reported that her mother doesn't love her and is constantly saying negative things about patient. CSW inquired if the comments were about patient's substance use, patient reported yes. CSW asked if patient felt like the comments were coming from a place of worry/concern, patient reported no. Patient reported that everyone in her family makes stories up about her. CSW inquired if she feels that her family is worried due to recent drug over dose, patient reported no. CSW acknowledged that this can be hurtful and reminded patient that she is only in control of herself. CSW encouraged patient to focus on what she can do to change her circumstances. CSW inquired about patient's motivation to get and stay clean, patient reported having her children. CSW encouraged patient to consider her recent overdose and how she could've been taken away from her kids forever if she wasn't revived. Patient reported that she currently has an open CPS case in [facility] and that her aunt has her 3 daughters. Patient reported that her CPS Social Worker is [employer].Patient shared that Covid has been a barrier to completing tasks on her case plan to work towards getting her kids back. CSW acknowledged how Covid 19 could be barrier to completing certain things and encouraged patient to continue to work her case plan to try and get her  kids back. CSW encouraged patient to not give up.   Patient described the events that led to her hospitalization. Patient reported that she hitched hiked to get a new ankle monitor and it got dark while she was attempting to hitch hike back home. Patient reported that she then contacted law enforcement to avoid getting in trouble for missing curfew and they came to her location and arrested her. CSW inquired if patient was currently on probation or parole, patient reported no that her ankle monitor was a bond stipulation. CSW inquired if transportation was an issue for patient, patient reported no because her mother recently got her car fixed and can transport patient where she needs to go. CSW inquired if housing was an issue for patient, patient reported no she stays with her mother at [place], [place] [address] and patient's phone number is [phone]. Patient reported that it is a stressful environment because she argues with her mother whenever she is at home. Patient reported that she stays gone all day and comes back at night. 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 CSW spoke with patient about the safety risk associated with hitch hiking, patient's response was minimal.   Patient and CSW discussed patient's substance use at length. Patient reported that she started using pain pills at age 28 after her father passed away. 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 CSW inquired about patient's last period of sobriety, patient did not understand the question. CSW asked patient when was the last time she went a period of time without using drugs, patient reported that she went  to rehab in November 2019 and got clean in December 2019. Patient reported that she was clean until April 2020. CSW inquired about patient's triggers to use drugs, patient reported that she currently uses to not feel sick and if something bad happens she needs to get high  so she doesn't have to deal with it. CSW acknowledged patient's triggers for using substances and inquired about other coping skills that patient has used if something bad happens. Patient reported that she has always had drugs to cope and doesn't remember a time when she didn't. Patient shared that she didn't have a mom to run to in order to feel better. CSW asked patient if she wanted to stop using drugs, patient reported yes. Patient reported that she is tired of using and cant afford to use. CSW inquired about how patient makes money to get drugs, patient reported that she panhandles or asks boyfriend for money. CSW asked patient if her boyfriend also used drugs, patient reported no and that she uses alone. CSW inquired about any barriers that would prevent patient from being sober and drug free. Patient reported that if she doesn't withdraw she doesn't have to use. Patient reported that she is happy to be on subutext and was informed that she would have to find a place to follow up to continue subutext post discharge. CSW provided patient with many resources and encouraged patient to start calling now to set up a follow up appointment for medication assistance treatment. CSW informed patient that is important to create a plan before discharging so she can have what she needs in place before she leaves the hospital. CSW encouraged patient to consider her children as motivation to get and stay clean. CSW informed MOB about the [facility] horizons program and encouraged her to call and do the screening process to see if they have availability for residential treatment. CSW also provided patient with local mental health resources to consider therapy to get to the root of her issues and to learn new healthy coping skills to deal with issues, patient agreed. CSW agreed to follow up on Wednesday to see what she has accomplished and to go over her plan for discharge, patient agreeable to meeting with CSW again. Patient  reported that she will follow up at [facility] for prenatal care.   Abundio Miu, [place] Worker [facility] Cell#: [phone]

## 2019-04-05 NOTE — Progress Notes (Signed)
CSW attempted to meet with MOB, however MOB was not in her room. CSW will attempt to meet with MOB at a later time.   Abundio Miu, [place] Worker [facility] Cell#: [phone]

## 2019-04-06 DIAGNOSIS — Z3A34 34 weeks gestation of pregnancy: Secondary | ICD-10-CM

## 2019-04-06 LAB — GC/CHLAMYDIA PROBE AMP (~~LOC~~) NOT AT ARMC
Chlamydia: NEGATIVE
Comment: NEGATIVE
Comment: NORMAL
Neisseria Gonorrhea: NEGATIVE

## 2019-04-06 NOTE — Progress Notes (Signed)
Initial visit with Clorene and her boyfriend to introduce spiritual care services and offer support during her hospital stay.  Koralyn shared that she feels like she's doing a lot better emotionally.  She shared that she's aware of how her own stress and choices are impacting her own and her baby's health. She had FOB are planning to find alternative housing because living with Aundrea's mother is too stessful for them.  They report they plan to follow up with some of the options from social work.  Please page as further needs arise.  Donald Prose. Elyn Peers, M.Div. [facility] Chaplain Pager [phone] Office [phone]

## 2019-04-06 NOTE — Plan of Care (Signed)
  Problem: Education: Goal: Knowledge of disease or condition will improve Outcome: Completed/Met Goal: Knowledge of the prescribed therapeutic regimen will improve Outcome: Completed/Met   Problem: Education: Goal: Knowledge of General Education information will improve Description: Including pain rating scale, medication(s)/side effects and non-pharmacologic comfort measures Outcome: Completed/Met   Problem: Clinical Measurements: Goal: Diagnostic test results will improve Outcome: Completed/Met Goal: Cardiovascular complication will be avoided Outcome: Completed/Met   Problem: Activity: Goal: Risk for activity intolerance will decrease Outcome: Completed/Met   Problem: Coping: Goal: Level of anxiety will decrease Outcome: Completed/Met   Problem: Elimination: Goal: Will not experience complications related to bowel motility Outcome: Completed/Met Goal: Will not experience complications related to urinary retention Outcome: Completed/Met   Problem: Clinical Measurements: Goal: Respiratory complications will improve Outcome: Not Applicable

## 2019-04-06 NOTE — Progress Notes (Signed)
Patient has been going off the unit frequently. CN was informed. Patient instructed to not leave the off unit.

## 2019-04-06 NOTE — Progress Notes (Signed)
Patient ID: Brittney Nicholson, female   DOB: 1990/10/10, 28 y.o.   MRN: [id number] [place] ANTEPARTUM PROGRESS NOTE  KWANA RINGEL is a 28 y.o. [id number] at [redacted]w[redacted]d who is admitted for vaginal bleeding.  Estimated Date of Delivery: 05/15/19 Fetal presentation is cephalic.  Length of Stay:  4 Days. Admitted 04/01/2019  Subjective: Patient reports feeling well without complaints. Patient reports normal fetal movement.  She denies uterine contractions, denies bleeding and leaking of fluid per vagina.  Vitals:  Blood pressure (!) 103/53, pulse 73, temperature 98.4 F (36.9 C), temperature source Oral, resp. rate 16, height 5\' 7"  (1.702 m), weight 72.1 kg, last menstrual period 09/09/2018, SpO2 98 %, currently breastfeeding. Physical Examination: GENERAL: Well-developed, well-nourished female in no acute distress.  LUNGS: Clear to auscultation bilaterally.  HEART: Regular rate and rhythm. ABDOMEN: Soft, nontender, nondistended. No organomegaly. PELVIC: Not indicated EXTREMITIES: No cyanosis, clubbing, or edema, 2+ distal pulses.   Fetal monitoring: FHR: 145 bpm, Variability: moderate, Accelerations: Present, Decelerations: Absent  Uterine activity: no contractions per hour  Results for orders placed or performed during the hospital encounter of 04/01/19 (from the past 48 hour(s))  Type and screen [place]     Status: None   Collection Time: 04/05/19  5:45 AM  Result Value Ref Range   ABO/RH(D) O POS    Antibody Screen NEG    Sample Expiration      04/08/2019,2359 Performed at [facility] [address] [place], [place] [address]     I have reviewed the patient's current medications.  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 Plan for discharge on Friday if remain stable Inpatient management x 7 days S/p BTMZ Cont flagyl Cont suboxone Continue routine antenatal care.   [place] for [employer] (Faculty Practice)  04/06/2019 10:52 AM

## 2019-04-06 NOTE — Progress Notes (Signed)
Discussed off unit privileges with Dr Elly Modena. MD does not want pt going off unit, however, pt may have patio privileges.  Discussed this with pt. Pt states that she had never been told that she could not leave the unit, that she left b/c she gets "bored".  Pt informed that she may go to patio when desires. Pt & sig other verb understanding.

## 2019-04-07 NOTE — Progress Notes (Signed)
Patient ID: Brittney Nicholson, female   DOB: 1990-12-28, 28 y.o.   MRN: [id number] [place] ANTEPARTUM PROGRESS NOTE  Brittney Nicholson is a 28 y.o. [id number] at [redacted]w[redacted]d who is admitted for vaginal bleeding.  Estimated Date of Delivery: 05/15/19 Fetal presentation is cephalic.  Length of Stay:  5 Days. Admitted 04/01/2019  Subjective: Patient is without complaints this morning. Patient reports normal fetal movement.  She denies uterine contractions, denies bleeding and leaking of fluid per vagina.  Vitals:  Blood pressure (!) 98/53, pulse 81, temperature 97.8 F (36.6 C), temperature source Oral, resp. rate 16, height 5\' 7"  (1.702 m), weight 72.1 kg, last menstrual period 09/09/2018, SpO2 99 %, currently breastfeeding. Physical Examination: GENERAL: Well-developed, well-nourished female in no acute distress.  LUNGS: Clear to auscultation bilaterally.  HEART: Regular rate and rhythm. ABDOMEN: Soft, nontender, nondistended. No organomegaly. PELVIC: Not indicated EXTREMITIES: No cyanosis, clubbing, or edema, 2+ distal pulses.   Fetal monitoring: FHR: 135 bpm, Variability: moderate, Accelerations: Present, Decelerations: Absent  Uterine activity: no contractions per hour  No results found for this or any previous visit (from the past 48 hour(s)).  I have reviewed the patient's current medications.  ASSESSMENT: Principal Problem:   Vaginal bleeding in pregnancy, third trimester Active Problems:   Genital HSV   UTI (urinary tract infection) during pregnancy, second trimester   Pregnancy complicated by subutex maintenance, antepartum ([place])   Heroin use affecting pregnancy in third trimester   BV (bacterial vaginosis)   Drug use affecting pregnancy in third trimester   Limited prenatal care in third trimester   [redacted] weeks gestation of pregnancy   PLAN: Patient without vaginal bleeding since admission and s/p BMZ Plan for discharge on Friday if remain stable Cont flagyl Cont  suboxone Continue routine antenatal care.   [place] for [employer] (Faculty Practice)  04/07/2019 9:04 AM

## 2019-04-08 DIAGNOSIS — Z3A34 34 weeks gestation of pregnancy: Secondary | ICD-10-CM

## 2019-04-08 DIAGNOSIS — O4693 Antepartum hemorrhage, unspecified, third trimester: Secondary | ICD-10-CM | POA: Diagnosis not present

## 2019-04-08 DIAGNOSIS — O99323 Drug use complicating pregnancy, third trimester: Secondary | ICD-10-CM

## 2019-04-08 NOTE — Progress Notes (Signed)
CSW followed up with patient at bedside to discuss patient's discharge plans, patient's boyfriend present. Patient asked her boyfriend to leave her room and he left voluntarily. CSW inquired about how patient was feeling, patient reported that she was feeling "pretty good". Patient updated CSW that she plans to go to [facility] in [place] for rehab. Patient reported that she has confirmed a bed as [facility] as she is there by Saturday morning. Patient reported that she is supposed to be discharged Friday. CSW inquired about how patient would get to [facility], patient reported that her mother would take her. CSW inquired about how patient felt about going, patient reported that she has been there before and liked it. Patient reported that her boyfriend is happy for her and her mother didn't say anything about it. CSW praised patient for coming up with a plan and strongly encouraged patient to go to rehab Friday so she wouldn't risk losing her bed if anything prevented her from getting there Saturday morning. Patient reported that she also called the mental health resources and left messages. CSW positively affirmed patient's productiveness and encouraged her to continue following up with necessary resources.  Patient reported that she followed up with DSS about applying for housing and has thought about moving to [place] because they have a bus system. CSW encouraged patient to follow up with [place] and [place] housing [place] to see if they having any availability, patient agreed. Patient reported that she also followed up with her lawyer to discuss her upcoming court date on 04/15/2019 and that the DSS attorney was going to try and get it rescheduled. CSW praised patient for following up with all the resources mentioned above and encouraged patient to continue on her path to sobriety and trying to get her children back.   CSW inquired if patient had any additional  needs/concerns. Patient reported none. CSW encouraged patient to contact CSW if any needs/concerns arise. Patient reported that she saved CSW's number in her phone. Patient thanked CSW for following up with her.   Abundio Miu, [place] Worker [facility] Cell#: [phone]

## 2019-04-08 NOTE — Progress Notes (Signed)
Patient ID: TEYLA SKIDGEL, female   DOB: [birth year], 28 y.o.   MRN: [id number] [place] ANTEPARTUM PROGRESS NOTE  KEARSTON PUTMAN is a 28 y.o. [id number] at [redacted]w[redacted]d who is admitted for vaginal bleeding.  Fetal presentation is cephalic.  Length of Stay:  6 Days. Admitted 04/01/2019  Subjective: Patient is without complaints this morning. Patient reports normal fetal movement.  She denies uterine contractions, denies bleeding and leaking of fluid per vagina.  Vitals:  Blood pressure (!) 100/53, pulse 74, temperature 98.2 F (36.8 C), temperature source Oral, resp. rate 18, height 5\' 7"  (1.702 m), weight 72.1 kg, last menstrual period 09/09/2018, SpO2 100 %, currently breastfeeding. Physical Examination: GENERAL: Well-developed, well-nourished female in no acute distress.  LUNGS: Clear to auscultation bilaterally.  HEART: Regular rate and rhythm. ABDOMEN: Soft, nontender, nondistended. No organomegaly. PELVIC: Not indicated EXTREMITIES: No cyanosis, clubbing, or edema, 2+ distal pulses.   Fetal monitoring: FHR: 135 bpm, Variability: moderate, Accelerations: Present, Decelerations: Absent  Uterine activity: no contractions per hour  No results found for this or any previous visit (from the past 48 hour(s)).  I have reviewed the patient's current medications.  ASSESSMENT: Principal Problem:   Vaginal bleeding in pregnancy, third trimester Active Problems:   Genital HSV   UTI (urinary tract infection) during pregnancy, second trimester   Pregnancy complicated by subutex maintenance, antepartum ([place])   Heroin use affecting pregnancy in third trimester   BV (bacterial vaginosis)   Drug use affecting pregnancy in third trimester   Limited prenatal care in third trimester   [redacted] weeks gestation of pregnancy   PLAN: Patient without vaginal bleeding since admission and s/p BMZ Plan for discharge on tomorrow if remain stable Cont suboxone Continue routine antenatal  care.   [place] for [employer] (Faculty Practice)  04/08/2019 10:22 AM

## 2019-04-09 DIAGNOSIS — O99323 Drug use complicating pregnancy, third trimester: Secondary | ICD-10-CM | POA: Diagnosis not present

## 2019-04-09 DIAGNOSIS — O4693 Antepartum hemorrhage, unspecified, third trimester: Secondary | ICD-10-CM | POA: Diagnosis not present

## 2019-04-09 DIAGNOSIS — Z3A34 34 weeks gestation of pregnancy: Secondary | ICD-10-CM | POA: Diagnosis not present

## 2019-04-09 MED ORDER — DOCUSATE SODIUM 100 MG PO CAPS: 100.0000 mg | ORAL_CAPSULE | Freq: Every day | ORAL | 3 refills | Status: DC

## 2019-04-09 MED ORDER — BUPRENORPHINE HCL-NALOXONE HCL 2-0.5 MG SL SUBL: 2.0000 | SUBLINGUAL_TABLET | Freq: Three times a day (TID) | SUBLINGUAL | 0 refills | Status: DC

## 2019-04-09 MED ORDER — ACETAMINOPHEN 325 MG PO TABS: 650.0000 mg | ORAL_TABLET | ORAL | 3 refills | Status: DC | PRN

## 2019-04-09 MED ORDER — VALACYCLOVIR HCL 500 MG PO TABS: 500.0000 mg | ORAL_TABLET | Freq: Two times a day (BID) | ORAL | 0 refills | Status: AC

## 2019-04-09 MED ORDER — BUPRENORPHINE HCL-NALOXONE HCL 8-2 MG SL FILM: ORAL_FILM | SUBLINGUAL | 0 refills | Status: DC

## 2019-04-09 NOTE — Discharge Summary (Signed)
Postpartum Discharge Summary     Patient Name: Brittney Nicholson DOB: 09-18-1990 MRN: [id number]  Date of admission: 04/01/2019 Delivering Provider: This patient has no babies on file.  Date of discharge: 04/09/2019  Admitting diagnosis: ABD PAIN, BLEEIDNG Intrauterine pregnancy: [redacted]w[redacted]d     Secondary diagnosis:  Principal Problem:   Vaginal bleeding in pregnancy, third trimester Active Problems:   Genital HSV   UTI (urinary tract infection) during pregnancy, second trimester   Pregnancy complicated by subutex maintenance, antepartum ([facility])   Heroin use affecting pregnancy in third trimester   BV (bacterial vaginosis)   Drug use affecting pregnancy in third trimester   Limited prenatal care in third trimester   [redacted] weeks gestation of pregnancy     Discharge diagnosis: third trimester vaginal bleeding                                   Hospital course:  Patient admitted for 7 day observation following an episode of vaginal bleeding. Patient also has a history of heroin abuse and was started on suboxone. Patient remained stable throughout her hospitalization. She completed a course of BMZ. Patient found stable for discharge with plans to follow up with [facility] on 04/12/19 to start prenatal care. Precautions reviewed   Physical exam  Vitals:   04/08/19 1527 04/08/19 1916 04/08/19 2355 04/09/19 0434  BP: (!) 104/58 (!) 106/50 (!) 103/49 (!) 103/56  Pulse: 86 88 77 89  Resp: 18 18 18 18   Temp: 98.2 F (36.8 C) 98.2 F (36.8 C) 98.2 F (36.8 C) 98 F (36.7 C)  TempSrc: Oral Oral Oral Oral  SpO2: 99% 100% 98% 98%  Weight:      Height:       GENERAL: Well-developed, well-nourished female in no acute distress.  LUNGS: Clear to auscultation bilaterally.  HEART: Regular rate and rhythm. ABDOMEN: Soft, nontender, gravid PELVIC: Not performed EXTREMITIES: No cyanosis, clubbing, or edema, 2+ distal pulses.  Labs: Lab Results  Component Value Date   WBC 12.0 (H) 04/01/2019   HGB 12.0  04/01/2019   HCT 36.0 04/01/2019   MCV 84.7 04/01/2019   PLT 180 04/01/2019   CMP Latest Ref Rng & Units 04/01/2019  Glucose 70 - 99 mg/dL 85  BUN 6 - 20 mg/dL 8  Creatinine 04/03/2019 - 9.51 mg/dL 8.84  Sodium 1.66 - 063 mmol/L 134(L)  Potassium 3.5 - 5.1 mmol/L 3.6  Chloride 98 - 111 mmol/L 103  CO2 22 - 32 mmol/L 20(L)  Calcium 8.9 - 10.3 mg/dL 9.1  Total Protein 6.5 - 8.1 g/dL 6.7  Total Bilirubin 0.3 - 1.2 mg/dL 0.5  Alkaline Phos 38 - 126 U/L 119  AST 15 - 41 U/L 17  ALT 0 - 44 U/L 15    After visit meds:  Allergies as of 04/09/2019      Reactions   Gluten Other (See Comments)   RECTAL BLEEDING   Wheat Other (See Comments)   Rectal bleeding      Medication List    STOP taking these medications   buprenorphine 8 MG Subl SL tablet Commonly known as: SUBUTEX   ibuprofen 600 MG tablet Commonly known as: ADVIL     TAKE these medications   acetaminophen 325 MG tablet Commonly known as: TYLENOL Take 2 tablets (650 mg total) by mouth every 4 (four) hours as needed (for pain scale < 4  OR  temperature  >/=  100.5 F).   buprenorphine-naloxone 2-0.5 mg Subl SL tablet Commonly known as: SUBOXONE Place 2 tablets under the tongue 3 (three) times daily.   docusate sodium 100 MG capsule Commonly known as: COLACE Take 1 capsule (100 mg total) by mouth daily.   multivitamin-prenatal 27-0.8 MG Tabs tablet Take 1 tablet by mouth daily at 12 noon.   valACYclovir 500 MG tablet Commonly known as: VALTREX Take 1 tablet (500 mg total) by mouth 2 (two) times daily.       Diet: routine diet  Activity: Advance as tolerated. Pelvic rest for 6 weeks.   Outpatient follow up: next week Follow up Appt: Future Appointments  Date Time Provider [place]  04/12/2019  1:30 PM Octaviano Glow, RN [facility]  04/12/2019  2:10 PM Cresenzo-Dishmon, Joaquim Lai, CNM [facility]   Follow up Visit: Follow-up Information    [facility] Follow up.   Why: As scheduled on  04/12/19 Contact information: [place] [phone]          04/09/2019 Mora Bellman, MD

## 2019-04-09 NOTE — Discharge Instructions (Signed)
Vaginal Bleeding During Pregnancy, Third Trimester ° °A small amount of bleeding from the vagina (spotting) is relatively common during pregnancy. Various things can cause bleeding or spotting during pregnancy. Sometimes bleeding is normal and is not a problem. However, bleeding during the third trimester can also be a sign of something serious for the mother and the baby. Be sure to tell your health care provider about any vaginal bleeding right away. °Some possible causes of vaginal bleeding during the third trimester include: °· Infection or growths (polyps) on the cervix. °· A condition in which the placenta partially or completely covers the opening of the cervix inside the uterus (placenta previa). °· The placenta separating from the uterus (placenta abruption). °· The start of labor (discharging of the mucus plug). °· A condition in which the placenta grows into the muscle layer of the uterus (placenta accreta). °Follow these instructions at home: °Activity °· Follow instructions from your health care provider about limiting your activity. If your health care provider recommends activity restriction, you may need to stay in bed and only get up to use the bathroom. In some cases, your health care provider may allow you to continue light activity. °· If needed, make plans for someone to help with your regular activities. °· Ask your health care provider if it is safe for you to drive. °· Do not lift anything that is heavier than 10 lb (4.5 kg), or the limit that your health care provider tells you, until he or she says that it is safe. °· Do not have sex or orgasms until your health care provider says that this is safe. °Medicines °· Take over-the-counter and prescription medicines only as told by your health care provider. °· Do not take aspirin because it can cause bleeding. °General instructions °· Pay attention to any changes in your symptoms. °· Write down how many pads you use each day, how often you  change pads, and how soaked (saturated) they are. °· Do not use tampons or douche. °· If you pass any tissue from your vagina, save the tissue so you can show it to your health care provider. °· Keep all follow-up visits as told by your health care provider. This is important. °Contact a health care provider if: °· You have vaginal bleeding during any part of your pregnancy. °· You have cramps or labor pains. °· You have a fever. °Get help right away if: °· You have severe cramps or pain in your back or abdomen. °· You have a gush of fluid from the vagina. °· You pass large clots or a large amount of tissue from your vagina. °· Your bleeding increases. °· You feel light-headed or weak. °· You faint. °· You feel that your baby is moving less than usual, or not moving at all. °Summary °· Various things can cause bleeding or spotting in pregnancy. °· Bleeding during the third trimester can be a sign of a serious problem for the mother and the baby. °· Be sure to tell your health care provider about any vaginal bleeding right away. °This information is not intended to replace advice given to you by your health care provider. Make sure you discuss any questions you have with your health care provider. °Document Released: 08/17/2002 Document Revised: 09/15/2018 Document Reviewed: 08/29/2016 °Elsevier Patient Education © 2020 [employer] ° °

## 2019-04-10 ENCOUNTER — Encounter (HOSPITAL_COMMUNITY): Payer: Self-pay | Admitting: Emergency Medicine

## 2019-04-10 ENCOUNTER — Inpatient Hospital Stay (HOSPITAL_COMMUNITY)
Admission: AD | Admit: 2019-04-10 | Discharge: 2019-04-15 | DRG: 806 | Disposition: A | Discharge status: Home / Self Care | Payer: Medicaid Other | Attending: Obstetrics & Gynecology | Admitting: Obstetrics & Gynecology

## 2019-04-10 ENCOUNTER — Other Ambulatory Visit: Payer: Self-pay

## 2019-04-10 DIAGNOSIS — F1120 Opioid dependence, uncomplicated: Secondary | ICD-10-CM

## 2019-04-10 DIAGNOSIS — Z2839 Other underimmunization status: Secondary | ICD-10-CM

## 2019-04-10 DIAGNOSIS — O9832 Other infections with a predominantly sexual mode of transmission complicating childbirth: Secondary | ICD-10-CM | POA: Diagnosis present

## 2019-04-10 DIAGNOSIS — R109 Unspecified abdominal pain: Secondary | ICD-10-CM

## 2019-04-10 DIAGNOSIS — F1190 Opioid use, unspecified, uncomplicated: Secondary | ICD-10-CM | POA: Diagnosis present

## 2019-04-10 DIAGNOSIS — R0781 Pleurodynia: Secondary | ICD-10-CM

## 2019-04-10 DIAGNOSIS — O09899 Supervision of other high risk pregnancies, unspecified trimester: Secondary | ICD-10-CM

## 2019-04-10 DIAGNOSIS — O99320 Drug use complicating pregnancy, unspecified trimester: Secondary | ICD-10-CM

## 2019-04-10 DIAGNOSIS — O99344 Other mental disorders complicating childbirth: Secondary | ICD-10-CM | POA: Diagnosis present

## 2019-04-10 DIAGNOSIS — Z87891 Personal history of nicotine dependence: Secondary | ICD-10-CM

## 2019-04-10 DIAGNOSIS — F418 Other specified anxiety disorders: Secondary | ICD-10-CM | POA: Diagnosis present

## 2019-04-10 DIAGNOSIS — O99323 Drug use complicating pregnancy, third trimester: Secondary | ICD-10-CM | POA: Diagnosis present

## 2019-04-10 DIAGNOSIS — O4290 Premature rupture of membranes, unspecified as to length of time between rupture and onset of labor, unspecified weeks of gestation: Secondary | ICD-10-CM

## 2019-04-10 DIAGNOSIS — O0933 Supervision of pregnancy with insufficient antenatal care, third trimester: Secondary | ICD-10-CM

## 2019-04-10 DIAGNOSIS — O9902 Anemia complicating childbirth: Secondary | ICD-10-CM | POA: Diagnosis present

## 2019-04-10 DIAGNOSIS — O479 False labor, unspecified: Secondary | ICD-10-CM

## 2019-04-10 DIAGNOSIS — Z3A35 35 weeks gestation of pregnancy: Secondary | ICD-10-CM

## 2019-04-10 DIAGNOSIS — O4700 False labor before 37 completed weeks of gestation, unspecified trimester: Secondary | ICD-10-CM

## 2019-04-10 DIAGNOSIS — O99324 Drug use complicating childbirth: Secondary | ICD-10-CM | POA: Diagnosis present

## 2019-04-10 DIAGNOSIS — Z283 Underimmunization status: Secondary | ICD-10-CM

## 2019-04-10 DIAGNOSIS — A6000 Herpesviral infection of urogenital system, unspecified: Secondary | ICD-10-CM | POA: Diagnosis present

## 2019-04-10 DIAGNOSIS — D649 Anemia, unspecified: Secondary | ICD-10-CM | POA: Diagnosis present

## 2019-04-10 DIAGNOSIS — O26899 Other specified pregnancy related conditions, unspecified trimester: Secondary | ICD-10-CM

## 2019-04-10 DIAGNOSIS — O42913 Preterm premature rupture of membranes, unspecified as to length of time between rupture and onset of labor, third trimester: Principal | ICD-10-CM | POA: Diagnosis present

## 2019-04-10 MED ORDER — ACETAMINOPHEN 500 MG PO TABS
1000.0000 mg | ORAL_TABLET | Freq: Once | ORAL | Status: AC
  Administered 2019-04-10: 1000 mg via ORAL
  Filled 2019-04-10: qty 2

## 2019-04-10 MED ORDER — SODIUM CHLORIDE 0.9 % IV SOLN
1000.0000 mL | INTRAVENOUS | Status: DC
  Administered 2019-04-10: 1000 mL via INTRAVENOUS

## 2019-04-10 MED ORDER — SODIUM CHLORIDE 0.9 % IV BOLUS (SEPSIS)
1000.0000 mL | Freq: Once | INTRAVENOUS | Status: AC
  Administered 2019-04-10: 1000 mL via INTRAVENOUS

## 2019-04-10 MED ORDER — NIFEDIPINE 10 MG PO CAPS
10.0000 mg | ORAL_CAPSULE | Freq: Once | ORAL | Status: DC
  Filled 2019-04-10: qty 1

## 2019-04-10 NOTE — ED Provider Notes (Addendum)
[facility] EMERGENCY DEPARTMENT Provider Note   CSN: [id number] Arrival date & time: 04/10/19  2137     History   Chief Complaint Chief Complaint  Patient presents with  . Contractions    HPI Brittney Nicholson is a 28 y.o. female.     Patient is a 28 year old female who presents to the emergency department with complaint of contractions.  The patient is a gravida 6 para 3 abortus 2 who presents to the emergency department because of increase in what she thinks to be contractions, as well as increased pain.  The patient was evaluated at the [facility] on October 23 because of abdominal pain and bleeding.  She says she has not had any bleeding since that time.  She was concerned because the pain seemed to be getting progressively worse and came to the emergency department for evaluation.  She denies any recent vaginal discharge, no unusual vaginal burning, no urinary symptoms.  No fever or chills reported.    The history is provided by the patient.    Past Medical History:  Diagnosis Date  . Anxiety   . Depression   . 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 Headache    with this pregnancy  . HSV-2 infection complicating pregnancy   . Opiate use   . Pneumonia   . Pregnant 12/13/2014  . Syncope   . Warts     Patient Active Problem List   Diagnosis Date Noted  . Vaginal bleeding in pregnancy, third trimester 04/02/2019  . 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 BV (bacterial vaginosis) 04/02/2019  . Drug use affecting pregnancy in third trimester   . Limited prenatal care in third trimester   . [redacted] weeks gestation of pregnancy   . Pregnancy complicated by subutex maintenance, antepartum (HCC) 05/11/2018  . Rubella non-immune status, antepartum 03/19/2018  . Opiate use 03/12/2018  . 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 Depression with anxiety 03/12/2018  . UTI (urinary tract infection) during pregnancy, second trimester 03/12/2018  . 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 Warts 08/18/2012    Past Surgical History:  Procedure Laterality Date  . BREAST LUMPECTOMY  2006  . CHOLECYSTECTOMY N/A 01/25/2013   Procedure: LAPAROSCOPIC CHOLECYSTECTOMY;  Surgeon: Dalia HeadingMark A Jenkins, MD;  Location: [facility] ORS;  Service: General;  Laterality: N/A;  . right arm Right    has pins.  . TONSILLECTOMY    . TONSILLECTOMY  2011     OB History    Gravida  6   Para  3   Term  3   Preterm  0   AB  2   Living  3     SAB  2   TAB  0   Ectopic  0   Multiple  0   Live Births  3            Home Medications    Prior to Admission medications   Medication Sig Start Date End Date Taking? Authorizing Provider  acetaminophen (TYLENOL) 325 MG tablet Take 2 tablets (650 mg total) by mouth every 4 (four) hours as needed (for pain scale < 4  OR  temperature  >/=  100.5 F). 04/09/19   Constant, Peggy, MD  Buprenorphine HCl-Naloxone HCl 8-2 MG FILM Take half a film under the tongue 3 times a day 04/09/19   Constant, Peggy, MD  buprenorphine-naloxone (SUBOXONE) 2-0.5 mg SUBL SL tablet Place 2 tablets under the tongue 3 (three) times  daily for 14 days. 04/09/19 04/23/19  Constant, Peggy, MD  docusate sodium (COLACE) 100 MG capsule Take 1 capsule (100 mg total) by mouth daily. 04/09/19   Constant, Peggy, MD  Prenatal Vit-Fe Fumarate-FA (MULTIVITAMIN-PRENATAL) 27-0.8 MG TABS tablet Take 1 tablet by mouth daily at 12 noon.    [provider]  valACYclovir (VALTREX) 500 MG tablet Take 1 tablet (500 mg total) by mouth 2 (two) times daily. 04/09/19   Constant, Peggy, MD    Family History Family History  Problem Relation Age of Onset  . Cancer Mother        uterine  . Diabetes Father   . Kidney failure Father     Social History Social History   Tobacco Use  . Smoking status: Former Smoker    Packs/day: 1.00    Types: Cigarettes  . Smokeless tobacco: Never Used  . Tobacco comment: Quit September 2019  Substance Use Topics  . Alcohol  use: No  . Drug use: Not Currently    Types: Heroin    Comment: last use of heroin 24Jul 2019     Allergies   Gluten and Wheat   Review of Systems Review of Systems  Constitutional: Negative for activity change and appetite change.  HENT: Negative for congestion, ear discharge, ear pain, facial swelling, nosebleeds, rhinorrhea, sneezing and tinnitus.   Eyes: Negative for photophobia, pain and discharge.  Respiratory: Negative for cough, choking, shortness of breath and wheezing.   Cardiovascular: Negative for chest pain, palpitations and leg swelling.  Gastrointestinal: Negative for abdominal pain, blood in stool, constipation, diarrhea, nausea and vomiting.  Genitourinary: Positive for vaginal pain. Negative for difficulty urinating, dysuria, flank pain, frequency, hematuria, vaginal bleeding and vaginal discharge.  Musculoskeletal: Negative for back pain, gait problem, myalgias and neck pain.  Skin: Negative for color change, rash and wound.  Neurological: Negative for dizziness, seizures, syncope, facial asymmetry, speech difficulty, weakness and numbness.  Hematological: Negative for adenopathy. Does not bruise/bleed easily.  Psychiatric/Behavioral: Negative for agitation, confusion, hallucinations, self-injury and suicidal ideas. The patient is not nervous/anxious.      Physical Exam Updated Vital Signs BP 98/62 (BP Location: Left Arm)   Pulse 86   Resp (!) 22   Ht 5\' 7"  (1.702 m)   Wt 83.9 kg   LMP 09/09/2018 (Approximate)   SpO2 100%   BMI 28.98 kg/m   Physical Exam Vitals signs and nursing note reviewed.  Constitutional:      Appearance: She is well-developed. She is not toxic-appearing.  HENT:     Head: Normocephalic.     Right Ear: Tympanic membrane and external ear normal.     Left Ear: Tympanic membrane and external ear normal.  Eyes:     General: Lids are normal.     Pupils: Pupils are equal, round, and reactive to light.  Neck:     Musculoskeletal:  Normal range of motion and neck supple.     Vascular: No carotid bruit.  Cardiovascular:     Rate and Rhythm: Normal rate and regular rhythm.     Pulses: Normal pulses.     Heart sounds: Normal heart sounds.  Pulmonary:     Effort: No respiratory distress.     Breath sounds: Normal breath sounds.  Abdominal:     General: Bowel sounds are normal.     Palpations: Abdomen is soft.     Tenderness: There is no abdominal tenderness. There is no guarding.     Comments: Abdomen is soft with  good bowel sounds.  Abdomen is gravid. there is soreness to mild touch of the abdominal wall  Genitourinary:    Comments: No blood in the vaginal vault.  The cervix is 1-2 cm dilated. Musculoskeletal: Normal range of motion.  Lymphadenopathy:     Head:     Right side of head: No submandibular adenopathy.     Left side of head: No submandibular adenopathy.     Cervical: No cervical adenopathy.  Skin:    General: Skin is warm and dry.  Neurological:     Mental Status: She is alert and oriented to person, place, and time.     Cranial Nerves: No cranial nerve deficit.     Sensory: No sensory deficit.  Psychiatric:        Speech: Speech normal.      ED Treatments / Results  Labs (all labs ordered are listed, but only abnormal results are displayed) Labs Reviewed - No data to display  EKG None  Radiology No results found.  Procedures Procedures (including critical care time) CRITICAL CARE Performed by: Ivery Quale Total critical care time: **30* minutes Critical care time was exclusive of separately billable procedures and treating other patients. Critical care was necessary to treat or prevent imminent or life-threatening deterioration. Critical care was time spent personally by me on the following activities: development of treatment plan with patient and/or surrogate as well as nursing, discussions with consultants, evaluation of patient's response to treatment, examination of patient,  obtaining history from patient or surrogate, ordering and performing treatments and interventions, ordering and review of laboratory studies, ordering and review of radiographic studies, pulse oximetry and re-evaluation of patient's condition.  Medications Ordered in ED Medications - No data to display   Initial Impression / Assessment and Plan / ED Course  I have reviewed the triage vital signs and the nursing notes.  Pertinent labs & imaging results that were available during my care of the patient were reviewed by me and considered in my medical decision making (see chart for details).  Clinical Course as of Apr 10 1105  Sat Apr 10, 2019  2300 Taken by EMS   [MT]    Clinical Course User Index [MT] Renaye Rakers, Kermit Balo, MD         Final Clinical Impressions(s) / ED Diagnoses MDM  Patient having contractions approximately 5 minutes apart. Pt seen with me by Dr. Renaye Rakers  Patient is 1 to 2 cm dilated.  Blood pressure is 98/62.  IV fluid bolus started. Fetal heart rate 152.  Case discussed with OB. They are in agreement with IV fluid bolus.  Recommended Procardia 10 mg p.o.  Patient will be transferred to the women's MAU unit.   Final diagnoses:  Preterm contractions    ED Discharge Orders    None       Ivery Quale, PA-C 04/10/19 2258    Ivery Quale, PA-C 04/11/19 1107    Terald Sleeper, MD 04/11/19 1324

## 2019-04-10 NOTE — Progress Notes (Signed)
Report called to MAU Charge.

## 2019-04-10 NOTE — ED Triage Notes (Signed)
Pt was a visitor in ED with her fiancee when she started having contractions. Pt states she was just released from [facility] hospital on Friday for vaginal bleeding and contractions. Pt is [redacted] weeks pregnant with her 4th child.

## 2019-04-10 NOTE — MAU Note (Signed)
Was at [facility] w/ her boyfriend, started bleeding when she used the restroom.  No recent intercourse.  No LOF.  Reports she just left Friday for R/O abruption.  Was 1 cm yesterday-checked at [facility] and told she was 2 cm. + FM.

## 2019-04-10 NOTE — MAU Note (Signed)
Transferred from [facility]. PT c/o ctx. 2 cm dilation reported.

## 2019-04-11 ENCOUNTER — Inpatient Hospital Stay (HOSPITAL_COMMUNITY): Payer: Medicaid Other | Admitting: Anesthesiology

## 2019-04-11 ENCOUNTER — Encounter (HOSPITAL_COMMUNITY): Payer: Self-pay | Admitting: *Deleted

## 2019-04-11 DIAGNOSIS — Z87891 Personal history of nicotine dependence: Secondary | ICD-10-CM | POA: Diagnosis not present

## 2019-04-11 DIAGNOSIS — D649 Anemia, unspecified: Secondary | ICD-10-CM | POA: Diagnosis present

## 2019-04-11 DIAGNOSIS — O99344 Other mental disorders complicating childbirth: Secondary | ICD-10-CM | POA: Diagnosis present

## 2019-04-11 DIAGNOSIS — O9902 Anemia complicating childbirth: Secondary | ICD-10-CM | POA: Diagnosis present

## 2019-04-11 DIAGNOSIS — O42013 Preterm premature rupture of membranes, onset of labor within 24 hours of rupture, third trimester: Secondary | ICD-10-CM | POA: Diagnosis not present

## 2019-04-11 DIAGNOSIS — O99324 Drug use complicating childbirth: Secondary | ICD-10-CM | POA: Diagnosis present

## 2019-04-11 DIAGNOSIS — O9832 Other infections with a predominantly sexual mode of transmission complicating childbirth: Secondary | ICD-10-CM | POA: Diagnosis present

## 2019-04-11 DIAGNOSIS — A6000 Herpesviral infection of urogenital system, unspecified: Secondary | ICD-10-CM | POA: Diagnosis present

## 2019-04-11 DIAGNOSIS — O4290 Premature rupture of membranes, unspecified as to length of time between rupture and onset of labor, unspecified weeks of gestation: Secondary | ICD-10-CM | POA: Diagnosis not present

## 2019-04-11 DIAGNOSIS — Z3A35 35 weeks gestation of pregnancy: Secondary | ICD-10-CM

## 2019-04-11 DIAGNOSIS — F418 Other specified anxiety disorders: Secondary | ICD-10-CM | POA: Diagnosis present

## 2019-04-11 DIAGNOSIS — F1190 Opioid use, unspecified, uncomplicated: Secondary | ICD-10-CM | POA: Diagnosis present

## 2019-04-11 DIAGNOSIS — O42913 Preterm premature rupture of membranes, unspecified as to length of time between rupture and onset of labor, third trimester: Secondary | ICD-10-CM | POA: Diagnosis present

## 2019-04-11 DIAGNOSIS — O99824 Streptococcus B carrier state complicating childbirth: Secondary | ICD-10-CM | POA: Diagnosis not present

## 2019-04-11 LAB — TYPE AND SCREEN
ABO/RH(D): O POS
ABO/RH(D): O POS
Antibody Screen: NEGATIVE
Antibody Screen: NEGATIVE

## 2019-04-11 LAB — CBC
HCT: 33.3 % — ABNORMAL LOW (ref 36.0–46.0)
Hemoglobin: 10.8 g/dL — ABNORMAL LOW (ref 12.0–15.0)
MCH: 28.1 pg (ref 26.0–34.0)
MCHC: 32.4 g/dL (ref 30.0–36.0)
MCV: 86.7 fL (ref 80.0–100.0)
Platelets: 157 10*3/uL (ref 150–400)
RBC: 3.84 MIL/uL — ABNORMAL LOW (ref 3.87–5.11)
RDW: 13.5 % (ref 11.5–15.5)
WBC: 8 10*3/uL (ref 4.0–10.5)
nRBC: 0 % (ref 0.0–0.2)

## 2019-04-11 LAB — URINALYSIS, ROUTINE W REFLEX MICROSCOPIC
Bilirubin Urine: NEGATIVE
Glucose, UA: NEGATIVE mg/dL
Hgb urine dipstick: NEGATIVE
Ketones, ur: NEGATIVE mg/dL
Leukocytes,Ua: NEGATIVE
Nitrite: NEGATIVE
Protein, ur: NEGATIVE mg/dL
Specific Gravity, Urine: 1.009 (ref 1.005–1.030)
pH: 6 (ref 5.0–8.0)

## 2019-04-11 LAB — WET PREP, GENITAL
Sperm: NONE SEEN
Trich, Wet Prep: NONE SEEN
Yeast Wet Prep HPF POC: NONE SEEN

## 2019-04-11 LAB — RAPID URINE DRUG SCREEN, HOSP PERFORMED
Amphetamines: NOT DETECTED
Barbiturates: NOT DETECTED
Benzodiazepines: NOT DETECTED
Cocaine: NOT DETECTED
Opiates: NOT DETECTED
Tetrahydrocannabinol: NOT DETECTED

## 2019-04-11 LAB — HEMOGLOBIN A1C
Hgb A1c MFr Bld: 4.9 % (ref 4.8–5.6)
Mean Plasma Glucose: 93.93 mg/dL

## 2019-04-11 LAB — RPR: RPR Ser Ql: NONREACTIVE

## 2019-04-11 MED ORDER — ONDANSETRON HCL 4 MG/2ML IJ SOLN: 4.0000 mg | Freq: Four times a day (QID) | INTRAMUSCULAR | Status: DC | PRN

## 2019-04-11 MED ORDER — LIDOCAINE-EPINEPHRINE (PF) 2 %-1:200000 IJ SOLN
INTRAMUSCULAR | Status: DC | PRN
  Administered 2019-04-11: 1 mL via EPIDURAL
  Administered 2019-04-11: 2 mL via EPIDURAL

## 2019-04-11 MED ORDER — VALACYCLOVIR HCL 500 MG PO TABS
500.0000 mg | ORAL_TABLET | Freq: Two times a day (BID) | ORAL | Status: DC
  Administered 2019-04-11 (×2): 500 mg via ORAL
  Filled 2019-04-11 (×2): qty 1

## 2019-04-11 MED ORDER — BUPRENORPHINE HCL-NALOXONE HCL 2-0.5 MG SL SUBL: 2.0000 | SUBLINGUAL_TABLET | Freq: Three times a day (TID) | SUBLINGUAL | Status: DC

## 2019-04-11 MED ORDER — PROMETHAZINE HCL 25 MG/ML IJ SOLN
INTRAMUSCULAR | Status: AC
  Administered 2019-04-11: 25 mg via INTRAVENOUS
  Filled 2019-04-11: qty 1

## 2019-04-11 MED ORDER — OXYTOCIN BOLUS FROM INFUSION
500.0000 mL | Freq: Once | INTRAVENOUS | Status: AC
  Administered 2019-04-12: 500 mL via INTRAVENOUS

## 2019-04-11 MED ORDER — LACTATED RINGERS IV SOLN
INTRAVENOUS | Status: DC
  Administered 2019-04-11 – 2019-04-12 (×3): via INTRAVENOUS

## 2019-04-11 MED ORDER — FAMOTIDINE IN NACL 20-0.9 MG/50ML-% IV SOLN
20.0000 mg | Freq: Once | INTRAVENOUS | Status: AC
  Administered 2019-04-11: 01:00:00 20 mg via INTRAVENOUS

## 2019-04-11 MED ORDER — BUPRENORPHINE HCL-NALOXONE HCL 2-0.5 MG SL SUBL
2.0000 | SUBLINGUAL_TABLET | Freq: Three times a day (TID) | SUBLINGUAL | Status: DC
  Administered 2019-04-11 – 2019-04-12 (×5): 2 via SUBLINGUAL
  Filled 2019-04-11 (×7): qty 2

## 2019-04-11 MED ORDER — CYCLOBENZAPRINE HCL 10 MG PO TABS
10.0000 mg | ORAL_TABLET | Freq: Once | ORAL | Status: AC
  Administered 2019-04-11: 10 mg via ORAL
  Filled 2019-04-11: qty 1

## 2019-04-11 MED ORDER — FAMOTIDINE IN NACL 20-0.9 MG/50ML-% IV SOLN
INTRAVENOUS | Status: AC
  Administered 2019-04-11: 20 mg via INTRAVENOUS
  Filled 2019-04-11: qty 50

## 2019-04-11 MED ORDER — SODIUM CHLORIDE 0.9 % IV SOLN
5.0000 10*6.[IU] | Freq: Once | INTRAVENOUS | Status: AC
  Administered 2019-04-11: 5 10*6.[IU] via INTRAVENOUS
  Filled 2019-04-11: qty 5

## 2019-04-11 MED ORDER — OXYTOCIN 40 UNITS IN NORMAL SALINE INFUSION - SIMPLE MED
1.0000 m[IU]/min | INTRAVENOUS | Status: DC
  Administered 2019-04-11: 2 m[IU]/min via INTRAVENOUS

## 2019-04-11 MED ORDER — EPHEDRINE 5 MG/ML INJ: 10.0000 mg | INTRAVENOUS | Status: DC | PRN

## 2019-04-11 MED ORDER — LIDOCAINE HCL (PF) 1 % IJ SOLN: 30.0000 mL | INTRAMUSCULAR | Status: DC | PRN

## 2019-04-11 MED ORDER — TERBUTALINE SULFATE 1 MG/ML IJ SOLN: 0.2500 mg | Freq: Once | INTRAMUSCULAR | Status: DC | PRN

## 2019-04-11 MED ORDER — MISOPROSTOL 50MCG HALF TABLET
50.0000 ug | ORAL_TABLET | ORAL | Status: DC | PRN
  Administered 2019-04-11 (×2): 50 ug via ORAL
  Filled 2019-04-11 (×2): qty 1

## 2019-04-11 MED ORDER — FLEET ENEMA 7-19 GM/118ML RE ENEM: 1.0000 | ENEMA | Freq: Once | RECTAL | Status: DC | PRN

## 2019-04-11 MED ORDER — ACETAMINOPHEN 325 MG PO TABS
650.0000 mg | ORAL_TABLET | ORAL | Status: DC | PRN
  Administered 2019-04-11: 650 mg via ORAL
  Filled 2019-04-11: qty 2

## 2019-04-11 MED ORDER — HYDROXYZINE HCL 50 MG/ML IM SOLN
50.0000 mg | Freq: Once | INTRAMUSCULAR | Status: AC
  Administered 2019-04-11: 50 mg via INTRAMUSCULAR
  Filled 2019-04-11: qty 1

## 2019-04-11 MED ORDER — SOD CITRATE-CITRIC ACID 500-334 MG/5ML PO SOLN
30.0000 mL | ORAL | Status: DC | PRN
  Administered 2019-04-11: 30 mL via ORAL
  Filled 2019-04-11: qty 30

## 2019-04-11 MED ORDER — FENTANYL CITRATE (PF) 100 MCG/2ML IJ SOLN
100.0000 ug | INTRAMUSCULAR | Status: DC | PRN
  Administered 2019-04-11: 100 ug via INTRAVENOUS

## 2019-04-11 MED ORDER — SODIUM CHLORIDE (PF) 0.9 % IJ SOLN
INTRAMUSCULAR | Status: DC | PRN
  Administered 2019-04-11: 12 mL/h via EPIDURAL

## 2019-04-11 MED ORDER — DIPHENHYDRAMINE HCL 50 MG/ML IJ SOLN: 12.5000 mg | INTRAMUSCULAR | Status: DC | PRN

## 2019-04-11 MED ORDER — FENTANYL-BUPIVACAINE-NACL 0.5-0.125-0.9 MG/250ML-% EP SOLN
12.0000 mL/h | EPIDURAL | Status: DC | PRN
  Administered 2019-04-12: 04:00:00 12 mL/h via EPIDURAL
  Filled 2019-04-11 (×2): qty 250

## 2019-04-11 MED ORDER — FENTANYL CITRATE (PF) 100 MCG/2ML IJ SOLN
INTRAMUSCULAR | Status: AC
  Filled 2019-04-11: qty 2

## 2019-04-11 MED ORDER — BUPRENORPHINE HCL-NALOXONE HCL 8-2 MG SL FILM: ORAL_FILM | SUBLINGUAL | 0 refills | Status: DC

## 2019-04-11 MED ORDER — MISOPROSTOL 25 MCG QUARTER TABLET: 25.0000 ug | ORAL_TABLET | ORAL | Status: DC | PRN

## 2019-04-11 MED ORDER — PENICILLIN G 3 MILLION UNITS IVPB - SIMPLE MED
3.0000 10*6.[IU] | INTRAVENOUS | Status: DC
  Administered 2019-04-11 – 2019-04-12 (×4): 3 10*6.[IU] via INTRAVENOUS
  Filled 2019-04-11 (×4): qty 100

## 2019-04-11 MED ORDER — MAGNESIUM HYDROXIDE 400 MG/5ML PO SUSP
30.0000 mL | Freq: Once | ORAL | Status: AC
  Administered 2019-04-11: 30 mL via ORAL
  Filled 2019-04-11: qty 30

## 2019-04-11 MED ORDER — LACTATED RINGERS IV SOLN: 500.0000 mL | Freq: Once | INTRAVENOUS | Status: DC

## 2019-04-11 MED ORDER — PROMETHAZINE HCL 25 MG/ML IJ SOLN
25.0000 mg | Freq: Once | INTRAMUSCULAR | Status: AC
  Administered 2019-04-11: 01:00:00 25 mg via INTRAVENOUS

## 2019-04-11 MED ORDER — PHENYLEPHRINE 40 MCG/ML (10ML) SYRINGE FOR IV PUSH (FOR BLOOD PRESSURE SUPPORT)
80.0000 ug | PREFILLED_SYRINGE | INTRAVENOUS | Status: DC | PRN
  Filled 2019-04-11: qty 10

## 2019-04-11 MED ORDER — LACTATED RINGERS IV SOLN
500.0000 mL | INTRAVENOUS | Status: DC | PRN
  Administered 2019-04-12: 05:00:00 500 mL via INTRAVENOUS

## 2019-04-11 MED ORDER — PHENYLEPHRINE 40 MCG/ML (10ML) SYRINGE FOR IV PUSH (FOR BLOOD PRESSURE SUPPORT): 80.0000 ug | PREFILLED_SYRINGE | INTRAVENOUS | Status: DC | PRN

## 2019-04-11 MED ORDER — OXYTOCIN 40 UNITS IN NORMAL SALINE INFUSION - SIMPLE MED
2.5000 [IU]/h | INTRAVENOUS | Status: DC
  Filled 2019-04-11: qty 1000

## 2019-04-11 MED ORDER — ONDANSETRON HCL 4 MG/2ML IJ SOLN
4.0000 mg | Freq: Once | INTRAMUSCULAR | Status: AC
  Administered 2019-04-11: 4 mg via INTRAVENOUS
  Filled 2019-04-11: qty 2

## 2019-04-11 NOTE — Progress Notes (Signed)
Brittney Nicholson is a 28 y.o. [phone] at [redacted]w[redacted]d by LMP admitted for rupture of membranes, PPROM.  Subjective: Pt comfortable, feeling some cramping but mild.  S/O in room for support.  Objective: BP (!) 80/37   Pulse 77   Temp 98.6 F (37 C) (Oral)   Resp 18   Ht 5\' 7"  (1.702 m)   Wt 83.9 kg   LMP 09/09/2018 (Approximate)   SpO2 100%   BMI 28.98 kg/m  No intake/output data recorded. No intake/output data recorded.  FHT:  FHR: 135 bpm, variability: moderate,  accelerations:  Present,  decelerations:  Present isolated 90 sec deceleration while pt up to bathroom UC:   Rare SVE:   Dilation: 1.5 Effacement (%): 30 Station: -3 Exam by:: Dr Caron Presume  Labs: Lab Results  Component Value Date   WBC 8.0 04/11/2019   HGB 10.8 (L) 04/11/2019   HCT 33.3 (L) 04/11/2019   MCV 86.7 04/11/2019   PLT 157 04/11/2019    Assessment / Plan: Augmentation of labor, progressing well  S/P Foley bulb insertion and Cytotec buccally at 0828  Labor: Progressing normally Preeclampsia:  n/a Fetal Wellbeing:  Overall category I with isolated decel Pain Control:  Labor support without medications I/D:  GBS unknown, PCN for prophylaxis PPROM Anticipated MOD:  SVD  Brittney Nicholson 04/11/2019, 10:12 AM

## 2019-04-11 NOTE — Anesthesia Preprocedure Evaluation (Signed)
Anesthesia Evaluation    Reviewed: Allergy & Precautions, Patient's Chart, lab work & pertinent test results  Airway Mallampati: II  TM Distance: >3 FB Neck ROM: Full    Dental no notable dental hx.    Pulmonary neg pulmonary ROS, former smoker,    Pulmonary exam normal breath sounds clear to auscultation       Cardiovascular negative cardio ROS Normal cardiovascular exam Rhythm:Regular Rate:Normal     Neuro/Psych  Headaches, PSYCHIATRIC DISORDERS Anxiety Depression    GI/Hepatic negative GI ROS, (+)     substance abuse (on suboxone)  marijuana use and IV drug use,   Endo/Other  negative endocrine ROS  Renal/GU negative Renal ROS  negative genitourinary   Musculoskeletal negative musculoskeletal ROS (+)   Abdominal   Peds  Hematology  (+) Blood dyscrasia (Hgb 10.8), anemia ,   Anesthesia Other Findings   Reproductive/Obstetrics (+) Pregnancy                             Anesthesia Physical Anesthesia Plan  ASA: II  Anesthesia Plan: Epidural   Post-op Pain Management:    Induction:   PONV Risk Score and Plan: Treatment may vary due to age or medical condition  Airway Management Planned: Natural Airway  Additional Equipment:   Intra-op Plan:   Post-operative Plan:   Informed Consent: I have reviewed the patients History and Physical, chart, labs and discussed the procedure including the risks, benefits and alternatives for the proposed anesthesia with the patient or authorized representative who has indicated his/her understanding and acceptance.       Plan Discussed with: Anesthesiologist  Anesthesia Plan Comments: (Patient identified. Risks, benefits, options discussed with patient including but not limited to bleeding, infection, nerve damage, paralysis, failed block, incomplete pain control, headache, blood pressure changes, nausea, vomiting, reactions to medication,  itching, and post partum back pain. Confirmed with bedside nurse the patient's most recent platelet count. Confirmed with the patient that they are not taking any anticoagulation, have any bleeding history or any family history of bleeding disorders. Patient expressed understanding and wishes to proceed. All questions were answered. )        Anesthesia Quick Evaluation

## 2019-04-11 NOTE — MAU Provider Note (Signed)
Chief Complaint:  Contractions   First Provider Initiated Contact with Patient 04/10/19 2350      HPI: Brittney Nicholson is a 28 y.o. [id number] at [redacted]w[redacted]d who presents to maternity admissions by EMS reporting vaginal bleeding and severe abdominal pain. Patient discharged yesterday after a 7 day stay for vaginal bleeding. Reports pain started a few hours ago while she was visiting her boyfriend in the hospital. Pain is all over her abdomen from ribs to suprapubic region. Unsure if it feels like contractions. Pain is constant with intermittent worsening. Some nausea currently. Denies constipation or diarrhea. She has a hx of drug abuse and has been prescribed Suboxone a number of times. She was sent with a script yesterday but reports there was a problem and it had to be sent back to prescribing doctor to sign and therefore she has been unable to pick it up. Denies other drug use in the meantime and if true, has not taken Suboxone for > 24 hours. Reports symptoms do not feel like withdrawal. Denies concern for STD and reports no intercourse for several weeks. She reports good fetal movement, denies LOF, vaginal itching/burning, urinary symptoms, h/a, dizziness, vomiting, or fever/chills.    Past Medical History: Past Medical History:  Diagnosis Date  . Anxiety   . Depression   . 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 Headache    with this pregnancy  . HSV-2 infection complicating pregnancy   . Opiate use   . Pneumonia   . Pregnant 12/13/2014  . Syncope   . Warts     Past obstetric history: OB History  Gravida Para Term Preterm AB Living  6 3 3  0 2 3  SAB TAB Ectopic Multiple Live Births  2 0 0 0 3    # Outcome Date GA Lbr Len/2nd Weight Sex Delivery Anes PTL Lv  6 Current           5 Term 06/20/18 [redacted]w[redacted]d  3070 g F Vag-Spont EPI  LIV  4 Term 07/28/15 [redacted]w[redacted]d 10:20 / 00:04 3549 g F Vag-Spont EPI N LIV  3 Term 11/08/12 [redacted]w[redacted]d 01:36 / 14:15 4034 g F Vag-Spont EPI N LIV     Birth Comments: WNL  2 SAB            1 SAB             Past Surgical History: Past Surgical History:  Procedure Laterality Date  . BREAST LUMPECTOMY  2006  . CHOLECYSTECTOMY N/A 01/25/2013   Procedure: LAPAROSCOPIC CHOLECYSTECTOMY;  Surgeon: Dalia Heading, MD;  Location: [facility];  Service: General;  Laterality: N/A;  . right arm Right    has pins.  . TONSILLECTOMY    . TONSILLECTOMY  2011    Family History: Family History  Problem Relation Age of Onset  . Cancer Mother        uterine  . Diabetes Father   . Kidney failure Father     Social History: Social History   Tobacco Use  . Smoking status: Former Smoker    Packs/day: 1.00    Types: Cigarettes  . Smokeless tobacco: Never Used  . Tobacco comment: Quit September 2019  Substance Use Topics  . Alcohol use: No  . Drug use: Not Currently    Types: Heroin    Comment: last use of heroin 24Jul 2019    Allergies:  Allergies  Allergen Reactions  . Gluten Other (See Comments)    RECTAL BLEEDING  . Wheat Other (See Comments)  Rectal bleeding    Meds:  Medications Prior to Admission  Medication Sig Dispense Refill Last Dose  . buprenorphine-naloxone (SUBOXONE) 2-0.5 mg SUBL SL tablet Place 2 tablets under the tongue 3 (three) times daily for 14 days. 84 tablet 0 04/10/2019 at 1000  . acetaminophen (TYLENOL) 325 MG tablet Take 2 tablets (650 mg total) by mouth every 4 (four) hours as needed (for pain scale < 4  OR  temperature  >/=  100.5 F). 30 tablet 3   . docusate sodium (COLACE) 100 MG capsule Take 1 capsule (100 mg total) by mouth daily. 30 capsule 3   . Prenatal Vit-Fe Fumarate-FA (MULTIVITAMIN-PRENATAL) 27-0.8 MG TABS tablet Take 1 tablet by mouth daily at 12 noon.     . valACYclovir (VALTREX) 500 MG tablet Take 1 tablet (500 mg total) by mouth 2 (two) times daily. 60 tablet 0   . [DISCONTINUED] Buprenorphine HCl-Naloxone HCl 8-2 MG FILM Take half a film under the tongue 3 times a day 5 each 0     ROS:  Review of Systems All other systems  negative unless noted above in HPI.   I have reviewed patient's Past Medical Hx, Surgical Hx, Family Hx, Social Hx, medications and allergies.   Physical Exam   Patient Vitals for the past 24 hrs:  BP Temp Temp src Pulse Resp SpO2 Height Weight  04/11/19 0645 (!) 103/45 97.9 F (36.6 C) Oral 83 19 - - -  04/10/19 2245 110/76 - - 91 (!) 22 100 % - -  04/10/19 2154 - - - - - 100 % - -  04/10/19 2150 - - - - - - 5\' 7"  (1.702 m) 83.9 kg  04/10/19 2149 98/62 - Oral 86 (!) 22 - - -   Constitutional: Well-developed, well-nourished female, appears uncomfortable Cardiovascular: normal rate Respiratory: normal effort Chest wall: non-tender to palpation  GI: Abd soft, no focal point of tenderness, mild suprapubic tenderness, gravid appropriate for gestational age.  MS: Extremities nontender, no edema, normal ROM Neurologic: Alert and oriented x 4.  GU: Neg CVAT. PELVIC EXAM: Cervix pink, visually ~ 1-2 cm dilated, without lesion, scant white creamy discharge, vaginal walls and external genitalia normal, no blood noted  Dilation: 1.5 Effacement (%): 30 Station: -3 Presentation: Vertex Exam by:: Jerilynn Birkenheadhelsea Jondavid Schreier, MD  FHT:  Baseline 130, moderate variability, accelerations present, no decelerations Contractions: None, intermittent irritability    Labs: Results for orders placed or performed during the hospital encounter of 04/10/19 (from the past 24 hour(s))  Wet prep, genital     Status: Abnormal   Collection Time: 04/11/19 12:11 AM   Specimen: Genital  Result Value Ref Range   Yeast Wet Prep HPF POC NONE SEEN NONE SEEN   Trich, Wet Prep NONE SEEN NONE SEEN   Clue Cells Wet Prep HPF POC PRESENT (A) NONE SEEN   WBC, Wet Prep HPF POC MANY (A) NONE SEEN   Sperm NONE SEEN   Urinalysis, Routine w reflex microscopic     Status: None   Collection Time: 04/11/19  2:34 AM  Result Value Ref Range   Color, Urine YELLOW YELLOW   APPearance CLEAR CLEAR   Specific Gravity, Urine 1.009 1.005 -  1.030   pH 6.0 5.0 - 8.0   Glucose, UA NEGATIVE NEGATIVE mg/dL   Hgb urine dipstick NEGATIVE NEGATIVE   Bilirubin Urine NEGATIVE NEGATIVE   Ketones, ur NEGATIVE NEGATIVE mg/dL   Protein, ur NEGATIVE NEGATIVE mg/dL   Nitrite NEGATIVE NEGATIVE   Leukocytes,Ua  NEGATIVE NEGATIVE  Rapid urine drug screen (hospital performed)     Status: None   Collection Time: 04/11/19  2:34 AM  Result Value Ref Range   Opiates NONE DETECTED NONE DETECTED   Cocaine NONE DETECTED NONE DETECTED   Benzodiazepines NONE DETECTED NONE DETECTED   Amphetamines NONE DETECTED NONE DETECTED   Tetrahydrocannabinol NONE DETECTED NONE DETECTED   Barbiturates NONE DETECTED NONE DETECTED  CBC     Status: Abnormal   Collection Time: 04/11/19  7:13 AM  Result Value Ref Range   WBC 8.0 4.0 - 10.5 K/uL   RBC 3.84 (L) 3.87 - 5.11 MIL/uL   Hemoglobin 10.8 (L) 12.0 - 15.0 g/dL   HCT 16.1 (L) 09.6 - 04.5 %   MCV 86.7 80.0 - 100.0 fL   MCH 28.1 26.0 - 34.0 pg   MCHC 32.4 30.0 - 36.0 g/dL   RDW 40.9 81.1 - 91.4 %   Platelets 157 150 - 400 K/uL   nRBC 0.0 0.0 - 0.2 %   --/--/O POS (10/30 0034)  Imaging:  Dg Chest 1 View  Result Date: 04/01/2019 CLINICAL DATA:  Chest pain. Pregnant patient in third trimester pregnancy. EXAM: CHEST  1 VIEW COMPARISON:  02/11/2016 FINDINGS: The cardiomediastinal contours are normal. The lungs are clear. Pulmonary vasculature is normal. No consolidation, pleural effusion, or pneumothorax. No acute osseous abnormalities are seen. IMPRESSION: Normal chest radiograph. Electronically Signed   By: Narda Rutherford M.D.   On: 04/01/2019 23:53   [place] Mfm Ob Detail +14 Wk  Result Date: 04/02/2019 ----------------------------------------------------------------------  OBSTETRICS REPORT                         (Corrected Final 04/02/2019 04:46                                                                            pm) ---------------------------------------------------------------------- Patient Info   ID #:       [id number]                          D.O.B.:  1991-03-08 (28 yrs)  Name:       SIERA BEYERSDORF Caldwell               Visit Date: 04/02/2019 02:28 pm ---------------------------------------------------------------------- Performed By  Performed By:     Lenise Arena        Referred By:       MAU Nursing-                    RDMS                                      MAU/Triage  Attending:        Ma Rings MD         [facility] ---------------------------------------------------------------------- Orders   #  Description                           Code        Ordered By   1  [place] MFM OB DETAIL +14 WK               76811.01    [facility]  ----------------------------------------------------------------------   #  Order #                     Accession #                Episode #   1  [id number]                   [id number]                 [id number]  ---------------------------------------------------------------------- Indications   Insufficient Prenatal Care                      O09.30   Drug use complicating pregnancy, third          O99.323   trimester (heroin overdose 03/05/19)   Pelvic pain affecting pregnancy in third        O26.893   trimester   Vaginal bleeding in pregnancy, third            O46.93   trimester   Short interval between pregancies, 3rd          O09.893   trimester (SVD 06/2018)   [redacted] weeks gestation of pregnancy                 Z3A.33  ---------------------------------------------------------------------- Vital Signs  Weight       159                               Height:        5'7"  (lb):  BMI:         24.9 ---------------------------------------------------------------------- Fetal Evaluation  Num Of Fetuses:          1  Fetal Heart              137  Rate(bpm):  Cardiac Activity:        Observed  Presentation:            Cephalic  Placenta:                Anterior  P. Cord Insertion:        Visualized, central  Amniotic Fluid  AFI FV:      Within normal limits  AFI Sum(cm)     %Tile       Largest Pocket(cm)  11.8            32          4.03  RUQ(cm)       RLQ(cm)       LUQ(cm)        LLQ(cm)  4.03          1.58          3.5            2.69 ---------------------------------------------------------------------- Biometry  BPD:      81.2  mm     G. Age:  32w 4d  15  %    CI:         76.42  %    70 - 86                                                          FL/HC:       21.8  %    19.4 - 21.8  HC:      294.3  mm     G. Age:  32w 3d          2  %    HC/AC:       1.01       0.96 - 1.11  AC:      292.7  mm     G. Age:  33w 2d         35  %    FL/BPD:      79.1  %    71 - 87  FL:       64.2  mm     G. Age:  33w 1d         22  %    FL/AC:       21.9  %    20 - 24  HUM:      53.4  mm     G. Age:  31w 1d        < 5  %  LV:        5.3  mm  Est. FW:    2117   g    4 lb 11 oz      22  %                     m ---------------------------------------------------------------------- OB History  Gravidity:    6         Term:   3         SAB:   2 ---------------------------------------------------------------------- Gestational Age  Clinical EDD:  33w 6d                                        EDD:    05/15/19  U/S Today:     32w 6d                                        EDD:    05/22/19  Best:          33w 6d     Det. By:  Clinical EDD             EDD:    05/15/19 ---------------------------------------------------------------------- Anatomy  Cranium:               Appears normal         Aortic Arch:            Appears normal  Cavum:                 Appears normal         Ductal Arch:            Not well visualized  Ventricles:            Appears normal         Diaphragm:              Appears normal  Choroid Plexus:        Appears normal         Stomach:                Appears normal,                                                                        left sided  Cerebellum:            Appears normal          Abdomen:                Appears normal  Posterior Fossa:       Appears normal         Abdominal Wall:         Appears nml (cord                                                                        insert, abd wall)  Nuchal Fold:           Not applicable (>20    Cord Vessels:           Appears normal ([redacted]                         wks GA)                                        vessel cord)  Face:                  Appears normal         Kidneys:                Appear normal                         (orbits and profile)  Lips:                  Appears normal         Bladder:                Appears normal  Thoracic:              Appears normal         Spine:                  Ltd views no  intracranial signs of                                                                        NTD  Heart:                 Appears normal         Upper Extremities:      Appears normal                         (4CH, axis, and                         situs)  RVOT:                  Appears normal         Lower Extremities:      Appears normal  LVOT:                  Appears normal  Other:  Female gender Technically difficult due to advanced gestational age. ---------------------------------------------------------------------- Cervix Uterus Adnexa  Cervix  Not visualized (advanced GA >24wks)  Uterus  No abnormality visualized.  Left Ovary  Within normal limits.  Right Ovary  Within normal limits.  Cul De Sac  No free fluid seen.  Adnexa  No abnormality visualized. ---------------------------------------------------------------------- Comments  This patient was seen in consultation at the request of Dr.  Macon Large, due to maternal drug use, abdominal pain, and  vaginal bleeding.  The patient is a 28 year old gravida 6 para  3-0-2-3 with a long history of drug abuse.  The patient  reports that she snorted heroin yesterday that may have  been laced with fentanyl.  She began  experiencing abdominal  pain last night and was admitted to the hospital.  She has a  history of a drug overdose on September 25 at which time  she had to be resuscitated secondary to cardiac arrest.  The  patient reports that she may have been experiencing  withdrawal symptoms earlier today.  She was started on  Suboxone this afternoon.  She continues to note lower  abdominal cramping.  Due to abdominal pain and vaginal  bleeding, she is currently receiving a complete course of  antenatal corticosteroids.  The patient reports that she has  had limited prenatal care in her current pregnancy.  She  reports 3 prior normal spontaneous vaginal deliveries.  Her  cervix on admission was 1 cm dilated.  On an ultrasound performed today, the overall EFW was 4  pounds 11 ounces (22nd percentile for her gestational age).  The amniotic fluid level was within normal limits with an AFI  of 11.8 cm.  An anterior placenta is noted with no signs of a  retroplacental clot noted.  Her fetal heart rate tracing is  reactive.  There were no contractions noted on the toco.  The patient was advised that the cause of her lower  abdominal pain remains undetermined.  Her abdominal pain  may be related to an acute withdrawal from the drugs that  she was using.  My suspicion for a placenta abruption is low.  She should continue to be treated with  Suboxone for the  remainder of her pregnancy.  Due to vaginal bleeding, she  should be observed in the hospital for 7 days with daily fetal  testing.  She should receive the second dose of  betamethasone.  The patient understands that due to drug  use in pregnancy, her baby may require prolonged  hospitalization to be weaned off of opioids after delivery.  The patient was encouraged to avoid and discontinue illegal  drug use after she is discharged.  At the end of the consultation, the patient stated that all of  her questions had been answered to her complete  satisfaction.  ---------------------------------------------------------------------- Recommendations  Complete course of antenatal corticosteroids  Observation in the hospital for the next week with daily fetal  testing  Continue Suboxone for the remainder of her pregnancy  Weekly fetal testing as an outpatient once she is discharge ----------------------------------------------------------------------                        Ma Rings, MD Electronically Signed Corrected Final Report  04/02/2019 04:46 pm ----------------------------------------------------------------------  [place] Mfm Ob Limited  Result Date: 04/02/2019 ----------------------------------------------------------------------  OBSTETRICS REPORT                       (Signed Final 04/02/2019 10:38 pm) ---------------------------------------------------------------------- Patient Info  ID #:       [id number]                          D.O.B.:  1991/01/24 (28 yrs)  Name:       VIHANA KYDD Falero               Visit Date: 04/01/2019 11:51 pm ---------------------------------------------------------------------- Performed By  Performed By:     Hurman Horn          Referred By:       MAU Nursing-                    RDMS                                      MAU/Triage  Attending:        Lin Landsman      [facility] ---------------------------------------------------------------------- Orders   #  Description                          Code         Ordered By   1  [place] MFM OB LIMITED                    40981.19     Wynelle Bourgeois  ----------------------------------------------------------------------   #  Order #                    Accession #  Episode #   1  [id number]                  [id number]                  [id number]  ---------------------------------------------------------------------- Indications   [redacted] weeks gestation of pregnancy                Z3A.33    Insufficient Prenatal Care                     O09.30   Drug use complicating pregnancy, third         O99.323   trimester (heroin overdose 03/05/19)   Pelvic pain affecting pregnancy in third       O26.893   trimester   Vaginal bleeding in pregnancy, third trimester O46.93   Short interval between pregancies, 3rd         O09.893   trimester (SVD 06/2018)  ---------------------------------------------------------------------- Vital Signs  Weight (lb): 190                               Height:        5'7"  BMI:         29.75 ---------------------------------------------------------------------- Fetal Evaluation  Num Of Fetuses:          1  Fetal Heart Rate(bpm):   137  Cardiac Activity:        Observed  Presentation:            Cephalic  Placenta:                Anterior  P. Cord Insertion:       Visualized, central  Amniotic Fluid  AFI FV:      Within normal limits  AFI Sum(cm)     %Tile       Largest Pocket(cm)  8.25            6           4.14                RLQ(cm)       LUQ(cm)        LLQ(cm)                2.54          4.14           1.57  Comment:    No placental abruption or previa identified. ---------------------------------------------------------------------- Biometry  BPD:      83.5  mm     G. Age:  33w 4d         43  %                                                          FL/BPD:      73.5  %    71 - 87  FL:       61.4  mm     G. Age:  31w 6d          5  % ---------------------------------------------------------------------- OB History  Gravidity:    6         Term:   3  SAB:   2 ---------------------------------------------------------------------- Gestational Age  Clinical EDD:  33w 5d                                        EDD:   05/15/19  U/S Today:     32w 5d                                        EDD:   05/22/19  Best:          33w 5d     Det. By:  Clinical EDD             EDD:   05/15/19 ---------------------------------------------------------------------- Cervix Uterus Adnexa   Cervix  Not visualized (advanced GA >24wks)  Uterus  No abnormality visualized.  Left Ovary  Within normal limits.  Right Ovary  Within normal limits.  Adnexa  No abnormality visualized. ---------------------------------------------------------------------- Impression  Limited exam  No evidence of placenta previa or abruption ---------------------------------------------------------------------- Recommendations  Clinical correlation reommended ----------------------------------------------------------------------               Sander Nephew, MD Electronically Signed Final Report   04/02/2019 10:38 pm ----------------------------------------------------------------------   MAU Course/MDM: Orders Placed This Encounter  Procedures  . Wet prep, genital  . SARS Coronavirus 2 by RT PCR (hospital order, performed in [facility] hospital lab) Nasopharyngeal Nasopharyngeal Swab  . Culture, beta strep (group b only)  . Urinalysis, Routine w reflex microscopic  . Rapid urine drug screen (hospital performed)  . CBC  . RPR  . Diet clear liquid Room service appropriate? Yes; Fluid consistency: Thin  . Vitals signs per unit policy  . Notify Physician  . Fetal monitoring per unit policy  . Activity as tolerated  . Cervical Exam  . Measure blood pressure post delivery every 15 min x 1 hour then every 30 min x 1 hour  . Fundal check post delivery every 15 min x 1 hour then every 30 min x 1 hour  . If Rapid HIV test positive or known HIV positive: initiate AZT orders  . May in and out cath x 2 for inability to void  . Insert foley catheter  . Discontinue foley prior to vaginal delivery  . Initiate Carrier Fluid Protocol  . Initiate Oral Care Protocol  . Order Rapid HIV per protocol if no results on chart  . Evaluate fetal heart rate to establish reassuring pattern prior to initiating Cytotec or Pitocin  . Perform a cervical exam prior to initiating Cytotec or Pitocin  . Discontinue Pitocin if  tachysystole with non-reassuring FHR is present  . Nofify MD/CNM if tachysystole with non-reassuring FHR is present  . Initiate intrauterine resuscitation if tachysystole with non-reasuring FHR is present  . If tachysystole WITH reassuring FHR present notify MD / CNM  . May administer Terbutaline 0.25 mg SQ x 1 dose if tachysystole with non-reassuring FHR is presesnt  . Labor Induction  . Full code  . Type and screen  . Insert and maintain IV Line  . Admit to Inpatient (patient's expected length of stay will be greater than 2 midnights or inpatient only procedure)    Meds ordered this encounter  Medications  . sodium chloride 0.9 % bolus 1,000 mL  . DISCONTD: 0.9 %  sodium chloride infusion  . NIFEdipine (  PROCARDIA) capsule 10 mg  . acetaminophen (TYLENOL) tablet 1,000 mg  . ondansetron (ZOFRAN) injection 4 mg  . DISCONTD: buprenorphine-naloxone (SUBOXONE) 2-0.5 mg per SL tablet 2 tablet  . buprenorphine-naloxone (SUBOXONE) 2-0.5 mg per SL tablet 2 tablet  . cyclobenzaprine (FLEXERIL) tablet 10 mg  . promethazine (PHENERGAN) injection 25 mg  . famotidine (PEPCID) IVPB 20 mg premix  . famotidine (PEPCID) 20-0.9 MG/50ML-% IVPB    Cioce, Anna   : cabinet override  . promethazine (PHENERGAN) 25 MG/ML injection    Cioce, Anna   : cabinet override  . hydrOXYzine (VISTARIL) injection 50 mg  . Buprenorphine HCl-Naloxone HCl 8-2 MG FILM    Sig: Take half a film under the tongue 3 times a day    Dispense:  5 each    Refill:  0    ZOXWRU:[id number]  . lactated ringers infusion  . oxytocin (PITOCIN) IV BOLUS FROM BAG  . oxytocin (PITOCIN) IV infusion 40 units in NS 1000 mL - Premix  . lactated ringers infusion 500-1,000 mL  . acetaminophen (TYLENOL) tablet 650 mg  . ondansetron (ZOFRAN) injection 4 mg  . sodium citrate-citric acid (ORACIT) solution 30 mL  . lidocaine (PF) (XYLOCAINE) 1 % injection 30 mL  . terbutaline (BRETHINE) injection 0.25 mg  . misoprostol (CYTOTEC) tablet 25 mcg   . FOLLOWED BY Linked Order Group   . penicillin G potassium 5 Million Units in sodium chloride 0.9 % 250 mL IVPB     Order Specific Question:   Antibiotic Indication:     Answer:   Group B Strep Prophylaxis   . penicillin G 3 million units in sodium chloride 0.9% 100 mL IVPB     Order Specific Question:   Antibiotic Indication:     Answer:   Group B Strep Prophylaxis  . buprenorphine-naloxone (SUBOXONE) 2-0.5 mg per SL tablet 2 tablet      Assessment: 1. Preterm premature rupture of membranes in third trimester, unspecified duration to onset of labor   2. Preterm contractions   3. Abdominal pain affecting pregnancy   4. Rib pain   5. Suboxone maintenance treatment complicating pregnancy, antepartum (HCC)   NST reviewed: Reactive Unclear etiology for abdominal pain. Does not appear to be contractions and not making cervical change. No focal area of abdominal pain to suggest specific etiology. Withdrawal symptoms more likely although patient reports going through withdrawal previously and not feeling like this. Report of bleeding, but no signs of active bleeding or old blood on speculum exam. Patient given Flexeril and Suboxone along with Pepcid and Phenergan/Zofran. Nausea and suprapubic pain improved. Rib pain bilaterally persisted. Pain mildly improved with eating. Also given Vistaril as patient reported feeling tired but couldn't sleep and appeared anxious. Pain still present thereafter but patient a little more comfortable.  UDS corroborates with patient story of no drug use. UA clean.  As patient in MAU for several hours; Suboxone given prior to discharge (~ 8 hours since last dose when considering daylight savings). Suboxone script printed for patient.  Patient reported continued mild rib pain but much improved and felt comfortable going home.  Plan: Just prior to DC patient went to bathroom and reported gush of fluid on floor. Fern slide positive. Will admit for IOL. See  H&P.  Jerilynn Birkenhead, MD [facility] Family [facility] Fellow, [facility] for [facility], [facility] 04/11/2019 7:44 AM

## 2019-04-11 NOTE — Progress Notes (Signed)
Brittney Nicholson is a 28 y.o. [phone] at [redacted]w[redacted]d admitted for rupture of membranes  Subjective: Pt uncomfortable, Fentanyl not relieving contraction pain, desires epidural.  Objective: BP (!) 93/48   Pulse 72   Temp 98.2 F (36.8 C) (Oral)   Resp 16   Ht 5\' 7"  (1.702 m)   Wt 83.9 kg   LMP 09/09/2018 (Approximate)   SpO2 100%   BMI 28.98 kg/m  No intake/output data recorded. No intake/output data recorded.  FHT:  FHR: 135 bpm, variability: moderate,  accelerations:  Abscent,  decelerations:  Absent UC:   Not tracing wel and difficult to palpate, toco readjusted several times SVE:   Dilation: 3 Effacement (%): 30 Station: -3 Exam by:: Dr Amedeo Gory CNM  Labs: Lab Results  Component Value Date   WBC 8.0 04/11/2019   HGB 10.8 (L) 04/11/2019   HCT 33.3 (L) 04/11/2019   MCV 86.7 04/11/2019   PLT 157 04/11/2019    Assessment / Plan: Augmentation of labor, progressing well  Labor: Foley bulb remains in place, but pt is 3-3.5 cm and thin.  Discussed options with pt including continuing Fentanyl and waiting for foley bulb to come out, or epidural with foley bulb removal.  Cytotec was placed recently so unable to start Pitocin right away so offered epidural with Foley still in place, recheck in 1-2 hours.  Pt desires epidural at this time.  Foley bulb left in place.  Preeclampsia:  n/a Fetal Wellbeing:  Category I Pain Control:  IV pain meds I/D:  GBS unknown, on PCN Anticipated MOD:  NSVD  Brittney Nicholson 04/11/2019, 2:21 PM

## 2019-04-11 NOTE — Progress Notes (Signed)
LABOR PROGRESS NOTE  Brittney Nicholson is a 28 y.o. [phone] at [redacted]w[redacted]d  admitted for SROM.  Subjective: Patient is resting comfortably.  Has no complaints at this time.  Objective: BP (!) 87/47   Pulse 78   Temp 98 F (36.7 C) (Oral)   Resp 16   Ht 5\' 7"  (1.702 m)   Wt 83.9 kg   LMP 09/09/2018 (Approximate)   SpO2 100%   BMI 28.98 kg/m  or  Vitals:   04/11/19 2118 04/11/19 2131 04/11/19 2201 04/11/19 2230  BP:  (!) 93/37 (!) 88/38 (!) 87/47  Pulse:  80 80 78  Resp: 16  16   Temp: 98 F (36.7 C)     TempSrc: Oral     SpO2:      Weight:      Height:       Dilation: 6 Effacement (%): 60 Cervical Position: Posterior Station: -2 Presentation: Vertex Exam by:: Meghan Rice, RN FHT: baseline rate 145, moderate varibility, + acel, no decel Toco: q2-4  Labs: Lab Results  Component Value Date   WBC 8.0 04/11/2019   HGB 10.8 (L) 04/11/2019   HCT 33.3 (L) 04/11/2019   MCV 86.7 04/11/2019   PLT 157 04/11/2019    Patient Active Problem List   Diagnosis Date Noted  . Labor and delivery, indication for care 04/11/2019  . Premature rupture of membranes 04/11/2019  . [redacted] weeks gestation of pregnancy 04/11/2019  . Vaginal bleeding in pregnancy, third trimester 04/02/2019  . 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 BV (bacterial vaginosis) 04/02/2019  . Drug use affecting pregnancy in third trimester   . Limited prenatal care in third trimester   . Pregnancy complicated by subutex maintenance, antepartum ([place]) 05/11/2018  . Rubella non-immune status, antepartum 03/19/2018  . Opiate use 03/12/2018  . 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 Depression with anxiety 03/12/2018  . UTI (urinary tract infection) during pregnancy, second trimester 03/12/2018  . 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 Warts 08/18/2012    Assessment / Plan: 28 y.o. [id number] at [redacted]w[redacted]d here for SROM  Labor: Progressing well Fetal Wellbeing: Category 1, reassuring Pain  Control: Epidural in place Anticipated MOD: Vaginal  Gifford Shave, MD  PGY-1, [facility]  04/11/2019, 10:37 PM

## 2019-04-11 NOTE — Anesthesia Procedure Notes (Signed)
Epidural Patient location during procedure: OB Start time: 04/11/2019 2:50 PM End time: 04/11/2019 3:05 PM  Staffing Anesthesiologist: Freddrick March, MD Performed: anesthesiologist   Preanesthetic Checklist Completed: patient identified, pre-op evaluation, timeout performed, IV checked, risks and benefits discussed and monitors and equipment checked  Epidural Patient position: sitting Prep: site prepped and draped and DuraPrep Patient monitoring: continuous pulse ox, blood pressure, heart rate and cardiac monitor Approach: midline Location: L3-L4 Injection technique: LOR air  Needle:  Needle type: Tuohy  Needle gauge: 17 G Needle length: 9 cm Needle insertion depth: 6 cm Catheter type: closed end flexible Catheter size: 19 Gauge Catheter at skin depth: 12 cm Test dose: negative  Assessment Sensory level: T8 Events: blood not aspirated, injection not painful, no injection resistance, negative IV test and no paresthesia  Additional Notes Patient identified. Risks/Benefits/Options discussed with patient including but not limited to bleeding, infection, nerve damage, paralysis, failed block, incomplete pain control, headache, blood pressure changes, nausea, vomiting, reactions to medication both or allergic, itching and postpartum back pain. Confirmed with bedside nurse the patient's most recent platelet count. Confirmed with patient that they are not currently taking any anticoagulation, have any bleeding history or any family history of bleeding disorders. Patient expressed understanding and wished to proceed. All questions were answered. Sterile technique was used throughout the entire procedure. Please see nursing notes for vital signs. Test dose was given through epidural catheter and negative prior to continuing to dose epidural or start infusion. Warning signs of high block given to the patient including shortness of breath, tingling/numbness in hands, complete motor block,  or any concerning symptoms with instructions to call for help. Patient was given instructions on fall risk and not to get out of bed. All questions and concerns addressed with instructions to call with any issues or inadequate analgesia.  Reason for block:procedure for pain

## 2019-04-11 NOTE — H&P (Signed)
OBSTETRIC ADMISSION HISTORY AND PHYSICAL  Brittney Nicholson is a 28 y.o. female [phone] with IUP at [redacted]w[redacted]d by LMP and 33w [place] presenting for PPROM. Patient presented to MAU for abdominal pain and rib pain and just prior to discharge had ROM with positive fern slide. Reported vaginal bleeding yesterday but no evidence of this on spec exam in MAU. She reports +FMs, no blurry vision, headaches or peripheral edema, and RUQ pain.  She plans on breast feeding. She requests Depo then BTL for birth control. [facility]   Dating: By LMP and 33w [place] --->  Estimated Date of Delivery: 05/15/19  Sono:  10/23  @[redacted]w[redacted]d , CWD, normal anatomy, cephalic presentation, anterior placental lie, 2117g, 22% EFW  Prenatal History/Complications: Hx of HSV on Valtrex Hx of Substance Abuse on Suboxone No PNC with exception of care in admission last week Vaginal bleeding 3rd trimester PPROM  Past Medical History: Past Medical History:  Diagnosis Date  . Anxiety   . Depression   . 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 Headache    with this pregnancy  . HSV-2 infection complicating pregnancy   . Opiate use   . Pneumonia   . Pregnant 12/13/2014  . Syncope   . Warts     Past Surgical History: Past Surgical History:  Procedure Laterality Date  . BREAST LUMPECTOMY  2006  . CHOLECYSTECTOMY N/A 01/25/2013   Procedure: LAPAROSCOPIC CHOLECYSTECTOMY;  Surgeon: Jamesetta So, MD;  Location: [facility];  Service: General;  Laterality: N/A;  . right arm Right    has pins.  . TONSILLECTOMY    . TONSILLECTOMY  2011    Obstetrical History: OB History    Gravida  6   Para  3   Term  3   Preterm  0   AB  2   Living  3     SAB  2   TAB  0   Ectopic  0   Multiple  0   Live Births  3           Social History: Social History   Socioeconomic History  . Marital status: Single    Spouse name: Not on file  . Number of children: 2  . Years of education: Not on file  . Highest education  level: Not on file  Occupational History  . Not on file  Social Needs  . Financial resource strain: Not on file  . Food insecurity    Worry: Not on file    Inability: Not on file  . Transportation needs    Medical: Not on file    Non-medical: Not on file  Tobacco Use  . Smoking status: Former Smoker    Packs/day: 1.00    Types: Cigarettes  . Smokeless tobacco: Never Used  . Tobacco comment: Quit September 2019  Substance and Sexual Activity  . Alcohol use: No  . Drug use: Not Currently    Types: Heroin    Comment: last use of heroin 24Jul 2019  . Sexual activity: Yes    Birth control/protection: None  Lifestyle  . Physical activity    Days per week: Not on file    Minutes per session: Not on file  . Stress: Not on file  Relationships  . Social Herbalist on phone: Not on file    Gets together: Not on file    Attends religious service: Not on file    Active member of club  or organization: Not on file    Attends meetings of clubs or organizations: Not on file    Relationship status: Not on file  Other Topics Concern  . Not on file  Social History Narrative   ** Merged History Encounter **        Family History: Family History  Problem Relation Age of Onset  . Cancer Mother        uterine  . Diabetes Father   . Kidney failure Father     Allergies: Allergies  Allergen Reactions  . Gluten Other (See Comments)    RECTAL BLEEDING  . Wheat Other (See Comments)    Rectal bleeding    Medications Prior to Admission  Medication Sig Dispense Refill Last Dose  . buprenorphine-naloxone (SUBOXONE) 2-0.5 mg SUBL SL tablet Place 2 tablets under the tongue 3 (three) times daily for 14 days. 84 tablet 0 04/10/2019 at 1000  . acetaminophen (TYLENOL) 325 MG tablet Take 2 tablets (650 mg total) by mouth every 4 (four) hours as needed (for pain scale < 4  OR  temperature  >/=  100.5 F). 30 tablet 3   . docusate sodium (COLACE) 100 MG capsule Take 1 capsule (100 mg  total) by mouth daily. 30 capsule 3   . Prenatal Vit-Fe Fumarate-FA (MULTIVITAMIN-PRENATAL) 27-0.8 MG TABS tablet Take 1 tablet by mouth daily at 12 noon.     . valACYclovir (VALTREX) 500 MG tablet Take 1 tablet (500 mg total) by mouth 2 (two) times daily. 60 tablet 0   . [DISCONTINUED] Buprenorphine HCl-Naloxone HCl 8-2 MG FILM Take half a film under the tongue 3 times a day 5 each 0      Review of Systems   All systems reviewed and negative except as stated in HPI  Blood pressure (!) 104/57, pulse 88, temperature 98.6 F (37 C), temperature source Oral, resp. rate 20, height 5\' 7"  (1.702 m), weight 83.9 kg, last menstrual period 09/09/2018, SpO2 100 %, currently breastfeeding. General appearance: alert, cooperative and appears stated age Lungs: normal effort Heart: regular rate  Abdomen: soft, non-tender; bowel sounds normal Pelvic: gravid uterus GU: No vaginal lesions  Extremities: Homans sign is negative, no sign of DVT Presentation: cephalic by exam Fetal monitoringBaseline: 145 bpm, Variability: Good {> 6 bpm), Accelerations: Reactive and Decelerations: Absent Uterine activityNone Dilation: 1.5 Effacement (%): 30 Station: -3 Exam by:: Dr [email or web address]   Prenatal labs: ABO, Rh: --/--/O POS (10/30 0034) Antibody: NEG (10/30 0034) Rubella: 3.77 (10/23 0200) RPR: NON REACTIVE (10/23 0200)  HBsAg: NON REACTIVE (10/23 0200)  HIV: NON REACTIVE (10/23 0200)  GBS: --03-28-1973 (12/16 1130)  2 hr Glucola not done Genetic screening  Not done Anatomy 05-20-1994 33 wk WNL as above  Prenatal Transfer Tool  Maternal Diabetes: No; unknown - no screening Genetic Screening: Not done Maternal Ultrasounds/Referrals: Normal Fetal Ultrasounds or other Referrals:  None Maternal Substance Abuse:  Yes. Suboxone (Hx of cocaine and heroine use) Significant Maternal Medications:  None Significant Maternal Lab Results: GBS unknown   Results for orders placed or performed during the hospital encounter of  04/10/19 (from the past 24 hour(s))  Wet prep, genital   Collection Time: 04/11/19 12:11 AM   Specimen: Genital  Result Value Ref Range   Yeast Wet Prep HPF POC NONE SEEN NONE SEEN   Trich, Wet Prep NONE SEEN NONE SEEN   Clue Cells Wet Prep HPF POC PRESENT (A) NONE SEEN   WBC, Wet Prep HPF POC MANY (A) NONE SEEN  Sperm NONE SEEN   Urinalysis, Routine w reflex microscopic   Collection Time: 04/11/19  2:34 AM  Result Value Ref Range   Color, Urine YELLOW YELLOW   APPearance CLEAR CLEAR   Specific Gravity, Urine 1.009 1.005 - 1.030   pH 6.0 5.0 - 8.0   Glucose, UA NEGATIVE NEGATIVE mg/dL   Hgb urine dipstick NEGATIVE NEGATIVE   Bilirubin Urine NEGATIVE NEGATIVE   Ketones, ur NEGATIVE NEGATIVE mg/dL   Protein, ur NEGATIVE NEGATIVE mg/dL   Nitrite NEGATIVE NEGATIVE   Leukocytes,Ua NEGATIVE NEGATIVE  Rapid urine drug screen (hospital performed)   Collection Time: 04/11/19  2:34 AM  Result Value Ref Range   Opiates NONE DETECTED NONE DETECTED   Cocaine NONE DETECTED NONE DETECTED   Benzodiazepines NONE DETECTED NONE DETECTED   Amphetamines NONE DETECTED NONE DETECTED   Tetrahydrocannabinol NONE DETECTED NONE DETECTED   Barbiturates NONE DETECTED NONE DETECTED  CBC   Collection Time: 04/11/19  7:13 AM  Result Value Ref Range   WBC 8.0 4.0 - 10.5 K/uL   RBC 3.84 (L) 3.87 - 5.11 MIL/uL   Hemoglobin 10.8 (L) 12.0 - 15.0 g/dL   HCT 16.133.3 (L) 09.636.0 - 04.546.0 %   MCV 86.7 80.0 - 100.0 fL   MCH 28.1 26.0 - 34.0 pg   MCHC 32.4 30.0 - 36.0 g/dL   RDW 40.913.5 81.111.5 - 91.415.5 %   Platelets 157 150 - 400 K/uL   nRBC 0.0 0.0 - 0.2 %    Patient Active Problem List   Diagnosis Date Noted  . Labor and delivery, indication for care 04/11/2019  . Premature rupture of membranes 04/11/2019  . [redacted] weeks gestation of pregnancy 04/11/2019  . Vaginal bleeding in pregnancy, third trimester 04/02/2019  . 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 BV (bacterial vaginosis) 04/02/2019  .  Drug use affecting pregnancy in third trimester   . Limited prenatal care in third trimester   . [redacted] weeks gestation of pregnancy   . Pregnancy complicated by subutex maintenance, antepartum ([facility]) 05/11/2018  . Rubella non-immune status, antepartum 03/19/2018  . Opiate use 03/12/2018  . 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 Depression with anxiety 03/12/2018  . UTI (urinary tract infection) during pregnancy, second trimester 03/12/2018  . 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 Warts 08/18/2012    Assessment/Plan:  Gregary CromerHeather E Paulson is a 28 y.o. [id number] at 3932w1d here for PPROM around 0700 on 11/1.  #Labor: S/p BMZ x 2 on admission last week. Will induce with Cytotec and Foley. Anticipate vaginal delivery. Cephalic by exam. #Pain: IV pain meds; epidural when desires  #FWB: Cat I; EFW: 2500g #ID:  GBS unknown; PCN ordered #MOF: Breast #MOC: Depo then interval BTL #Hx of HSV: no lesions on exam; cont Valtrex  Jerilynn Birkenheadhelsea Loys Shugars, MD [facility] Family [facility] Fellow, [facility], [facility] 04/11/2019, 8:01 AM

## 2019-04-11 NOTE — Progress Notes (Signed)
Brittney Nicholson is a 28 y.o. [phone] at [redacted]w[redacted]d admitted for rupture of membranes  Subjective: Pt comfortable with epidural.   Objective: BP (!) 74/28   Pulse 67   Temp 98.2 F (36.8 C) (Oral)   Resp 16   Ht 5\' 7"  (1.702 m)   Wt 83.9 kg   LMP 09/09/2018 (Approximate)   SpO2 100%   BMI 28.98 kg/m  No intake/output data recorded. Total I/O In: -  Out: 425 [Urine:425]  FHT:  FHR: 135 bpm, variability: moderate,  accelerations:  Present,  decelerations:  Absent UC:   regular, every 3-5 minutes, tracing on toco if pt supine, otherwise not tracing well SVE:   Dilation: 5 Effacement (%): 50 Station: -2 Exam by:: L Leftwich-Kierby CNM AROM of forebag and IUPC placed without difficulty.  Clear fluid noted. Pt tolerated well.   Labs: Lab Results  Component Value Date   WBC 8.0 04/11/2019   HGB 10.8 (L) 04/11/2019   HCT 33.3 (L) 04/11/2019   MCV 86.7 04/11/2019   PLT 157 04/11/2019    Assessment / Plan: Augmentation of labor, progressing well  Labor: Progressing normally Preeclampsia:  n/a Fetal Wellbeing:  Category I Pain Control:  Epidural I/D:  GBS unknown, on PCN Anticipated MOD:  NSVD  Fatima Blank 04/11/2019, 6:10 PM

## 2019-04-11 NOTE — Progress Notes (Signed)
Brittney Nicholson is a 28 y.o. [phone] at [redacted]w[redacted]d admitted for rupture of membranes  Subjective: Pt feeling painful contractions, desires medication for pain.    Objective: BP (!) 92/54   Pulse 75   Temp 98.3 F (36.8 C) (Oral)   Resp 16   Ht 5\' 7"  (1.702 m)   Wt 83.9 kg   LMP 09/09/2018 (Approximate)   SpO2 100%   BMI 28.98 kg/m  No intake/output data recorded. No intake/output data recorded.  FHT:  FHR: 135 bpm, variability: moderate,  accelerations:  Present,  decelerations:  Absent UC:   irregular, every 2-5 minutes, mild to palpation SVE:   Dilation: 3 Effacement (%): 30 Station: -3 Exam by:: Dr Amedeo Gory CNM Foley bulb palpated at cervical os, cervix 3 cm so foley still in place.    Labs: Lab Results  Component Value Date   WBC 8.0 04/11/2019   HGB 10.8 (L) 04/11/2019   HCT 33.3 (L) 04/11/2019   MCV 86.7 04/11/2019   PLT 157 04/11/2019    Assessment / Plan: Augmentation of labor, progressing well Foley bulb in place S/P Cytotec buccally x 1  Labor: Progressing normally, second dose of buccal Cytotec to be administered. Discussed narcotic pain medication with pt related to her drug abuse hx and current use of suboxone.  Pt aware of risks and plans epidural later in labor. Fentanyl 100 mcg Q hour ordered. Preeclampsia:  n/a Fetal Wellbeing:  Category I Pain Control:  IV pain meds I/D:  GBS unknown on PCN Anticipated MOD:  NSVD  Fatima Blank 04/11/2019, 12:46 PM

## 2019-04-12 ENCOUNTER — Ambulatory Visit: Payer: Medicaid Other | Admitting: *Deleted

## 2019-04-12 ENCOUNTER — Encounter: Payer: Medicaid Other | Admitting: Advanced Practice Midwife

## 2019-04-12 DIAGNOSIS — O99824 Streptococcus B carrier state complicating childbirth: Secondary | ICD-10-CM

## 2019-04-12 DIAGNOSIS — O99324 Drug use complicating childbirth: Secondary | ICD-10-CM

## 2019-04-12 LAB — GC/CHLAMYDIA PROBE AMP (~~LOC~~) NOT AT ARMC
Chlamydia: NEGATIVE
Comment: NEGATIVE
Comment: NORMAL
Neisseria Gonorrhea: NEGATIVE

## 2019-04-12 LAB — GROUP B STREP BY PCR: Group B strep by PCR: NEGATIVE

## 2019-04-12 MED ORDER — ONDANSETRON HCL 4 MG/2ML IJ SOLN: 4.0000 mg | INTRAMUSCULAR | Status: DC | PRN

## 2019-04-12 MED ORDER — ONDANSETRON HCL 4 MG PO TABS: 4.0000 mg | ORAL_TABLET | ORAL | Status: DC | PRN

## 2019-04-12 MED ORDER — ZOLPIDEM TARTRATE 5 MG PO TABS: 5.0000 mg | ORAL_TABLET | Freq: Every evening | ORAL | Status: DC | PRN

## 2019-04-12 MED ORDER — BENZOCAINE-MENTHOL 20-0.5 % EX AERO: 1.0000 "application " | INHALATION_SPRAY | CUTANEOUS | Status: DC | PRN

## 2019-04-12 MED ORDER — DIBUCAINE (PERIANAL) 1 % EX OINT: 1.0000 "application " | TOPICAL_OINTMENT | CUTANEOUS | Status: DC | PRN

## 2019-04-12 MED ORDER — SENNOSIDES-DOCUSATE SODIUM 8.6-50 MG PO TABS
2.0000 | ORAL_TABLET | ORAL | Status: DC
  Administered 2019-04-12 – 2019-04-14 (×3): 2 via ORAL
  Filled 2019-04-12 (×3): qty 2

## 2019-04-12 MED ORDER — WITCH HAZEL-GLYCERIN EX PADS: 1.0000 "application " | MEDICATED_PAD | CUTANEOUS | Status: DC | PRN

## 2019-04-12 MED ORDER — COCONUT OIL OIL: 1.0000 "application " | TOPICAL_OIL | Status: DC | PRN

## 2019-04-12 MED ORDER — IBUPROFEN 600 MG PO TABS
600.0000 mg | ORAL_TABLET | Freq: Four times a day (QID) | ORAL | Status: DC
  Administered 2019-04-12 – 2019-04-15 (×13): 600 mg via ORAL
  Filled 2019-04-12 (×13): qty 1

## 2019-04-12 MED ORDER — PRENATAL MULTIVITAMIN CH
1.0000 | ORAL_TABLET | Freq: Every day | ORAL | Status: DC
  Administered 2019-04-12 – 2019-04-15 (×4): 1 via ORAL
  Filled 2019-04-12 (×4): qty 1

## 2019-04-12 MED ORDER — DIPHENHYDRAMINE HCL 25 MG PO CAPS: 25.0000 mg | ORAL_CAPSULE | Freq: Four times a day (QID) | ORAL | Status: DC | PRN

## 2019-04-12 MED ORDER — TETANUS-DIPHTH-ACELL PERTUSSIS 5-2.5-18.5 LF-MCG/0.5 IM SUSP
0.5000 mL | Freq: Once | INTRAMUSCULAR | Status: DC
  Filled 2019-04-12: qty 0.5

## 2019-04-12 MED ORDER — BUPRENORPHINE HCL-NALOXONE HCL 2-0.5 MG SL SUBL: 2.0000 | SUBLINGUAL_TABLET | Freq: Every day | SUBLINGUAL | Status: DC

## 2019-04-12 MED ORDER — BUPRENORPHINE HCL-NALOXONE HCL 2-0.5 MG SL SUBL: 2.0000 | SUBLINGUAL_TABLET | Freq: Three times a day (TID) | SUBLINGUAL | Status: DC

## 2019-04-12 MED ORDER — BUPRENORPHINE HCL-NALOXONE HCL 2-0.5 MG SL SUBL
2.0000 | SUBLINGUAL_TABLET | Freq: Three times a day (TID) | SUBLINGUAL | Status: DC
  Administered 2019-04-12 – 2019-04-15 (×10): 2 via SUBLINGUAL
  Filled 2019-04-12: qty 1
  Filled 2019-04-12 (×10): qty 2

## 2019-04-12 MED ORDER — ACETAMINOPHEN 325 MG PO TABS
650.0000 mg | ORAL_TABLET | ORAL | Status: DC | PRN
  Administered 2019-04-12 – 2019-04-14 (×6): 650 mg via ORAL
  Filled 2019-04-12 (×6): qty 2

## 2019-04-12 MED ORDER — POLYETHYLENE GLYCOL 3350 17 G PO PACK
17.0000 g | PACK | Freq: Every day | ORAL | Status: DC
  Administered 2019-04-12 – 2019-04-14 (×3): 17 g via ORAL
  Filled 2019-04-12 (×3): qty 1

## 2019-04-12 MED ORDER — SIMETHICONE 80 MG PO CHEW: 80.0000 mg | CHEWABLE_TABLET | ORAL | Status: DC | PRN

## 2019-04-12 NOTE — Discharge Summary (Signed)
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 yo [phone] 373w2das admitted to the hospital 04/10/2019 for induction of labor.  Indication for induction: PPROM.  Patient had an uncomplicated labor course as follows: Membrane Rupture Time/Date: 6:25 AM ,04/11/2019   Intrapartum Procedures: Episiotomy: None [1]                                         Lacerations:  None [1]  Patient had delivery of a Viable infant.  Information for the patient's newborn:  SaEnolia, Koepke0[[id number]]Delivery Method: Vaginal, Spontaneous(Filed from Delivery Summary)    04/12/2019  Details of delivery can be found in separate delivery note. Patient did well post-partum. Discharge delayed in order to ensure Suboxone tablets were pre-approved; prior-auth done. Message sent to scheduling to have interval BTL scheduled and pre-op assessment/paperwork. Social work involved to assist with resources and patient has SW number to reach  out as needed. Patient is discharged home 04/15/19. Delivery time: 4:54 AM    Magnesium Sulfate received: No BMZ received: Yes Rhophylac:No MMR:No Transfusion:No  Physical exam  Vitals:   04/14/19 0545 04/14/19 1337 04/14/19 2107 04/15/19 0549  BP: (!) 97/52 (!) 102/52 (!) 111/58 (!) 105/54  Pulse: 68 77 76 73  Resp: _0 Temp: 98.4 F (36.9 C) 97.7 F (36.5 C) 98.8 F (37.1 C) 98.6 F (37 C)  TempSrc: Oral Oral Oral Oral  SpO2:  100% 100% 100%  Weight:      Height:       General: alert, cooperative and no distress Lochia: appropriate Uterine Fundus: firm DVT Evaluation: No evidence of DVT seen on physical exam. Labs: Lab Results  Component Value Date   WBC 7.0 04/13/2019   HGB 10.6 (L) 04/13/2019   HCT 34.0 (L) 04/13/2019   MCV 89.7 04/13/2019   PLT 142 (L) 04/13/2019   CMP Latest Ref Rng & Units 04/01/2019  Glucose 70 - 99 mg/dL 85  BUN 6 - 20 mg/dL 8  Creatinine 0.44 - 1.00 mg/dL 0.60  Sodium 135 - 145 mmol/L 134(L)  Potassium 3.5 - 5.1 mmol/L 3.6  Chloride 98 - 111 mmol/L 103  CO2 22 - 32 mmol/L 20(L)  Calcium 8.9 - 10.3 mg/dL 9.1  Total Protein 6.5 - 8.1 g/dL 6.7  Total Bilirubin 0.3 - 1.2 mg/dL 0.5  Alkaline Phos 38 - 126 U/L 119  AST 15 - 41 U/L 17  ALT 0 - 44 U/L 15    Discharge instruction: per After Visit Summary and "Baby and Me Booklet".  After visit meds:  Allergies as of 04/15/2019      Reactions   Gluten Other (See Comments)   RECTAL BLEEDING   Wheat Other (See Comments)   Rectal bleeding      Medication List    TAKE these medications   acetaminophen 500 MG tablet Commonly known as: TYLENOL Take 500 mg by mouth every 6 (six) hours as needed for mild pain.   acetaminophen 325 MG tablet Commonly known as: TYLENOL Take 2 tablets (650 mg total) by mouth every 4 (four) hours as needed (for pain scale < 4  OR  temperature  >/=  100.5 F).   buprenorphine-naloxone 2-0.5 mg Subl SL tablet Commonly known as: SUBOXONE Place 2  tablets under the tongue 3 (three) times daily for 14 days. What changed: Another medication with the same name was removed. Continue taking this medication, and follow the directions you see here.   docusate sodium 100 MG capsule Commonly known as: COLACE Take 1 capsule (100 mg total) by mouth daily.   multivitamin-prenatal 27-0.8 MG Tabs tablet Take 1 tablet by mouth daily at 12 noon.   valACYclovir 500 MG tablet Commonly known as: VALTREX Take 1 tablet (500 mg total) by mouth 2 (two) times daily.       Diet: routine diet  Activity: Advance as tolerated. Pelvic rest for 6 weeks.   Outpatient follow up:4 weeks Follow up Appt: Future Appointments  Date Time Provider [place]  05/24/2019  1:30 PM Cresenzo-Dishmon, Joaquim Lai, CNM [facility]   Follow up Visit: Follow-up Information    [facility] Follow up.   Specialty: Obstetrics and Gynecology Contact information: [address] [place] [phone]         Please schedule this patient for PP visit in: 4 weeks Low risk pregnancy complicated by: n/a Delivery mode:  SVD Anticipated Birth Control:  Plans Interval BTL PP Procedures needed: n/a  Schedule Integrated [place] visit: yes Provider: Any provider   Newborn Data: Live born female  Birth Weight: 11/2 @ 0454  APGAR: 82, 9  Newborn Delivery   Birth date/time: 04/12/2019 04:54:00 Delivery type: Vaginal, Spontaneous      Baby Feeding: Pumping Disposition:NICU

## 2019-04-12 NOTE — Anesthesia Postprocedure Evaluation (Signed)
Anesthesia Post Note  Patient: Brittney Nicholson  Procedure(s) Performed: AN AD [place]     Patient location during evaluation: Mother Baby Anesthesia Type: Epidural Level of consciousness: awake Pain management: satisfactory to patient Vital Signs Assessment: post-procedure vital signs reviewed and stable Respiratory status: spontaneous breathing Cardiovascular status: stable Anesthetic complications: no    Last Vitals:  Vitals:   04/12/19 0915 04/12/19 1016  BP: (!) 104/51 (!) 89/48  Pulse: 72 77  Resp: 16 18  Temp:  37 C  SpO2:      Last Pain:  Vitals:   04/12/19 1121  TempSrc:   PainSc: 5    Pain Goal:                   [employer]

## 2019-04-12 NOTE — Lactation Note (Signed)
This note was copied from a baby's chart. Lactation Consultation Note  Patient Name: Brittney Nicholson CHYIF'O Date: 04/12/2019 Reason for consult: Late-preterm 34-36.6wks;Infant < 6lbs  P4 mother whose infant is now 63 hours old.  This is a LPTI at 35+2 weeks weighing < 6 lbs.  Mother did not breast feed her first child.  She breast fed her second child (now 26 years old) for 3 months and her third child (now 61 months old) for 4 months.  Her feeding preference on admission was breast.  Baby was asleep in the bassinet when I arrived.  Mother stated she has tried to latch, however, baby has been very sleepy.  This is her first baby born early.  Reassured her this is very typical for a baby born at 3+2 weeks.  Reviewed "Caring For Your Late Preterm Baby" sheet in detail.  Suggested mother do a lot of STS with baby.  Mother is familiar with feeding cues.  Reminded her that this baby may not show cues for feedings.  Encouraged to feed at least every 3 hours due to gestational age and size.  Mother will call for latch assistance or help feeding as needed.  She prefers to use EBM only but is understanding of the possible need for formula supplementation.  RN had the DEBP set up and mother needed no review.  Asked her to pump immediately after attempting to latch baby or at least every 3 hours.  She began pumping while I was visiting with her and prefers to pump one breast at a time and do some good breast massage and breast compressions during pumping.  Praised her for her efforts.  She was already obtaining a few mls of EBM when I left the room.  Mother knows to feed back any EBM she obtains to baby.  There is a curved tip syringe at bedside and mother stated she knows how to use it.  She had to use it with her last child.  If she has any difficulty supplementing baby she will ask for assistance.    Mom made aware of O/P services, breastfeeding support groups, community resources, and our phone # for  post-discharge questions. Mother has a DEBP for home use.  Father present.  MD in room to assess baby at the end of my visit.   Maternal Data Formula Feeding for Exclusion: No Has patient been taught Hand Expression?: Yes Does the patient have breastfeeding experience prior to this delivery?: Yes  Feeding    LATCH Score                   Interventions    Lactation Tools Discussed/Used Pump Review: Setup, frequency, and cleaning;Milk Storage(Mother did not need to review)   Consult Status Consult Status: Follow-up Date: 04/13/19 Follow-up type: In-patient    [name] 04/12/2019, 2:37 PM

## 2019-04-12 NOTE — Progress Notes (Signed)
LABOR PROGRESS NOTE  Brittney Nicholson is a 28 y.o. [id number] at [redacted]w[redacted]d  admitted for SROM.  Subjective: Patient is currently resting comfortably and has no questions or complaints.  Objective: BP (!) 84/46   Pulse 68   Temp 98.5 F (36.9 C) (Oral)   Resp 16   Ht 5\' 7"  (1.702 m)   Wt 83.9 kg   LMP 09/09/2018 (Approximate)   SpO2 100%   BMI 28.98 kg/m  or  Vitals:   04/12/19 0010 04/12/19 0031 04/12/19 0101 04/12/19 0131  BP: (!) 75/40 (!) 85/37 (!) 93/49 (!) 84/46  Pulse: 72 68 85 68  Resp:   16   Temp:   98.5 F (36.9 C)   TempSrc:   Oral   SpO2:      Weight:      Height:       Dilation: 7.5 Effacement (%): 70, 80 Cervical Position: Posterior Station: -1 Presentation: Vertex Exam by:: Dr. Caron Presume FHT: baseline rate 135,  moderate varibility, + acel, no decel Toco: q2-4  Labs: Lab Results  Component Value Date   WBC 8.0 04/11/2019   HGB 10.8 (L) 04/11/2019   HCT 33.3 (L) 04/11/2019   MCV 86.7 04/11/2019   PLT 157 04/11/2019    Patient Active Problem List   Diagnosis Date Noted  . Labor and delivery, indication for care 04/11/2019  . Premature rupture of membranes 04/11/2019  . [redacted] weeks gestation of pregnancy 04/11/2019  . Vaginal bleeding in pregnancy, third trimester 04/02/2019  . 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 BV (bacterial vaginosis) 04/02/2019  . Drug use affecting pregnancy in third trimester   . Limited prenatal care in third trimester   . Pregnancy complicated by subutex maintenance, antepartum ([place]) 05/11/2018  . Rubella non-immune status, antepartum 03/19/2018  . Opiate use 03/12/2018  . 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 Depression with anxiety 03/12/2018  . UTI (urinary tract infection) during pregnancy, second trimester 03/12/2018  . 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 Warts 08/18/2012    Assessment / Plan: 28 y.o. [id number] at [redacted]w[redacted]d here for SROM  Labor: Progressing well, will continue to  monitor Fetal Wellbeing: Category 1, reassuring Pain Control: Epidural in place Anticipated MOD: Vaginal  Gifford Shave, MD  PGY-1, [facility]  04/12/2019, 1:46 AM
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
## 2019-04-12 NOTE — Progress Notes (Signed)
CSW acknowledged consult and attempted to meet with MOB. However, MOB reported she was feeling nauseas and was waiting for RN to come back. CSW to follow up at a later time.  Elijio Miles, [place]  Women's and [employer] [phone]

## 2019-04-13 LAB — CBC
HCT: 34 % — ABNORMAL LOW (ref 36.0–46.0)
Hemoglobin: 10.6 g/dL — ABNORMAL LOW (ref 12.0–15.0)
MCH: 28 pg (ref 26.0–34.0)
MCHC: 31.2 g/dL (ref 30.0–36.0)
MCV: 89.7 fL (ref 80.0–100.0)
Platelets: 142 10*3/uL — ABNORMAL LOW (ref 150–400)
RBC: 3.79 MIL/uL — ABNORMAL LOW (ref 3.87–5.11)
RDW: 13.8 % (ref 11.5–15.5)
WBC: 7 10*3/uL (ref 4.0–10.5)
nRBC: 0 % (ref 0.0–0.2)

## 2019-04-13 MED ORDER — MEDROXYPROGESTERONE ACETATE 150 MG/ML IM SUSP
150.0000 mg | Freq: Once | INTRAMUSCULAR | Status: AC
  Administered 2019-04-14: 150 mg via INTRAMUSCULAR
  Filled 2019-04-13: qty 1

## 2019-04-13 NOTE — Progress Notes (Addendum)
CSW provided escort for Martinique Houchins with [facility] CPS to meet with MOB and infant to complete assessment. CSW spoke with Martinique following assessment and he reported he would be staffing case with supervisor and would follow up with CSW regarding final disposition. CSW attempted to meet with MOB to process feelings after meeting with CPS but MOB unavailable to meet with. RN agreeable to reach out to CSW when MOB is able to meet.  Update: CSW informed by RN that MOB was available to meet with. CSW attempted to speak with MOB about how she was feeling following CPS meeting but MOB politely asked that CSW come back tomorrow morning. CSW to follow up with MOB in the morning. CSW also to discuss follow up appointment for Suboxone medication.   Elijio Miles, [place]  Women's and [employer] [phone]

## 2019-04-13 NOTE — Progress Notes (Addendum)
Post Partum Day 1 Subjective: no complaints, up ad lib, voiding, tolerating PO and + bowel movements  Objective: Blood pressure (!) 97/50, pulse 72, temperature 98.1 F (36.7 C), temperature source Oral, resp. rate 18, height 5\' 7"  (1.702 m), weight 83.9 kg, last menstrual period 09/09/2018, SpO2 100 %, currently breastfeeding.  Physical Exam:  General: alert and no distress Lochia: appropriate Uterine Fundus: firm DVT Evaluation: No evidence of DVT seen on physical exam.  Recent Labs    04/11/19 0713 04/13/19 0526  HGB 10.8* 10.6*  HCT 33.3* 34.0*    Assessment/Plan: Plan for discharge tomorrow, Breastfeeding and Contraception Depo-prevara followed by interval BTL   LOS: 2 days   Dorothyann Peng 04/13/2019, 7:47 AM  I saw and evaluated the patient. I agree with the findings and the plan of care as documented in the student's note. Depo ordered. Plan for DC tomorrow per patient preference. Discussed with SW. She will f/u with patient to better understand barrier to picking-up Suboxone that was previously prescribed as well as getting her a Suboxone appt. Patient prefers Suboxone tablets as opposed to films. Message sent to scheduling to get patient set up for pre-op BTL appt and consent to be signed.   Barrington Ellison, MD [facility] Fellow, [facility] for [employer], [place]

## 2019-04-13 NOTE — Progress Notes (Signed)
CSW received phone call from Jordan Houchins, assigned [place] CPS worker, inquiring about MOB and infant. CSW answered Jordan's questions and was informed that he would be meeting with MOB today. At this time, there are barriers to infant discharging to MOB. CSW awaiting CPS disposition.   Cebert Dettmann, LCSWA  [facility] [phone]  

## 2019-04-14 NOTE — Progress Notes (Signed)
Post Partum Day 2 Subjective: no complaints, up ad lib, voiding, tolerating PO and + bowel movements  Objective: Blood pressure (!) 97/52, pulse 68, temperature 98.4 F (36.9 C), temperature source Oral, resp. rate 16, height 5\' 7"  (1.702 m), weight 83.9 kg, last menstrual period 09/09/2018, SpO2 100 %, currently breastfeeding.  Physical Exam:  General: alert and no distress Lochia: appropriate Uterine Fundus: firm DVT Evaluation: No evidence of DVT seen on physical exam.  Recent Labs    04/13/19 0526  HGB 10.6*  HCT 34.0*    Assessment/Plan: Discharge home, Breastfeeding, Social Work consult and Contraception Depo-prevara followed by interval BTL  SW is supposed to help with arranging Suboxone pick-up.  Patient prefers Suboxone tablets over films.   LOS: 3 days   Dorothyann Peng 8:25AM 04/14/2019

## 2019-04-14 NOTE — Progress Notes (Addendum)
Patient ID: Brittney Nicholson, female   DOB: [birth year], 28 y.o.   MRN: [id number]  Requested by Juliet Rude, RN to call [place] [facility] to get prior authorization by Medicaid for the patient's Suboxone Rx that was sent to [facility] in [place], [place].   TC @1016  to [phone] spoke with representative Patty [phone]): notified that the preferred for of Suboxone is the film. Medicaid will not require prior authorization for the film up to 24 mg/day. Anything over 24 mg/day will not be prior authorized no matter the form of the medication.  TC @ 1035 to John in the [place] to see if they received Rx. Rx was received but the only strength of the medication they have is 8/2 mg. The patient has be Rx'd 2/0.5mg  x 2 tablets TID.  Advised to call Medicaid back.  TC @ 1050 to [facility] spoke to [employer] [phone]): redirected to pharmacy services dept.  TC @ 1100 to [place] [facility] spoke to representative Narmin [phone]): advised to complete prior authorization form online at [email or web address] > providers tab > LT side of page click on pharmacy services > click request forms  Patient updated at 1125 of the delay in the prior authorization. She verbalized an understanding and requested medication be sent to [facility] on [address] because her pharmacy in [place], [place] does not have any of the medication.  Dr. Barrington Ellison is assisting with the prior authorization process @ 1140. Per [place] [facility], we will need to call back tomorrow (04/15/19) to check on the status of the prior authorization form.  Laury Deep, CNM  04/14/2019 11:51 AM
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
## 2019-04-14 NOTE — Care Management (Signed)
CM called [facility] and spoke to [facility] pharmacist  And she stated that patient needs a prior auth from the provider to be able to obtain prescription- suboxone.   Larene [place] gave CM the phone # for provider to call and MCAID ID number of patient.   Ph# is [phone] and patient's MCAID ID number is: [id number].  CM gave information to MIdwife this am and she stated she would follow up before discharge.  Rosita Fire RNC-MNN, BSN Transitions of Care Pediatrics/Women's and [place]

## 2019-04-15 ENCOUNTER — Telehealth: Payer: Self-pay | Admitting: Obstetrics and Gynecology

## 2019-04-15 ENCOUNTER — Encounter (HOSPITAL_COMMUNITY): Payer: Self-pay | Admitting: *Deleted

## 2019-04-15 MED ORDER — BUPRENORPHINE HCL-NALOXONE HCL 2-0.5 MG SL SUBL: 2.0000 | SUBLINGUAL_TABLET | Freq: Three times a day (TID) | SUBLINGUAL | 0 refills | Status: AC

## 2019-04-15 MED ORDER — BUPRENORPHINE HCL-NALOXONE HCL 2-0.5 MG SL SUBL: 2.0000 | SUBLINGUAL_TABLET | Freq: Three times a day (TID) | SUBLINGUAL | 0 refills | Status: DC

## 2019-04-15 NOTE — Telephone Encounter (Signed)
Attempted to reach patient to see if she would be able to come in to this office for a BTL work-up. She is scheduled at [facility], but she has moved to [place]. I was not able to leave a message.

## 2019-04-15 NOTE — Progress Notes (Addendum)
Called [place] [facility] to follow-up on Prior-auth for Suboxone tabs that was sent 11/4. Prior-auth approved from 11/4 to 12/4. Went bedside to see patient and discuss plan with her. Patient called pharmacy where prescription was previously sent; will not be ready until tomorrow. Will cancel other prescriptions and send to pharmacy in [place].  Barrington Ellison, MD [facility] Family Medicine Fellow, [facility] for [employer], [place]

## 2019-04-15 NOTE — Progress Notes (Addendum)
CSW received phone call from Brittney Nicholson with [place] regarding infant's disposition. Per Brittney, [place] CPS has filed a non-secure custody order to take custody of infant. Brittney has faxed copy to CSW to place in infant's chart. Brittney stated he would be reaching out to [facility] to inform her that infant is now in [place] custody. Brittney also reported that he would be sending an Scientist, clinical (histocompatibility and immunogenetics) detailing who infant would be discharged to once that has been finalized.   CSW spoke with Brittney Nicholson after phone conversation with Brittney to process how she was feeling. CSW also informed Brittney Nicholson that now that [facility] has custody of infant that Brittney Nicholson and Brittney Nicholson will not be able to visit with infant without [facility] CPS present. CSW agreeable to supervise Brittney Nicholson and Brittney Nicholson with infant today as they were unaware of this but reiterated that future supervision would require CPS presence. Brittney Nicholson and Brittney Nicholson understanding of this.   Elijio Miles, [place]  Women's and [employer] [phone]
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
## 2019-04-15 NOTE — Progress Notes (Signed)
CSW received phone call from [facility] stating that she was preparing for discharge and had followed up with [facility] regarding prescription and they stated they did not have it in stock nor was there a [place] within 50 miles that had the suboxone prescription she needed. CSW spoke with both [facility] and [facility] in [place] who each reported they did not have the name brand version of the Suboxone but [facility] reported they had the generic form. CSW spoke with MD who stated the generic version should still work and she would transfer prescription. CSW to have MOB follow up with pharmacy once prescription has been sent.   Elijio Miles, [place]  [facility] [employer] [phone]

## 2019-05-24 ENCOUNTER — Ambulatory Visit: Payer: Medicaid Other | Admitting: Advanced Practice Midwife

## 2019-05-25 ENCOUNTER — Ambulatory Visit: Payer: Medicaid Other | Admitting: Obstetrics and Gynecology

## 2019-05-25 ENCOUNTER — Encounter: Payer: Self-pay | Admitting: Obstetrics and Gynecology

## 2019-06-21 ENCOUNTER — Encounter (HOSPITAL_COMMUNITY): Payer: Self-pay | Admitting: Emergency Medicine

## 2019-06-21 ENCOUNTER — Other Ambulatory Visit: Payer: Self-pay

## 2019-06-21 ENCOUNTER — Emergency Department (HOSPITAL_COMMUNITY)
Admission: EM | Admit: 2019-06-21 | Discharge: 2019-06-21 | Discharge status: Against Medical Advice | Payer: Medicaid Other | Attending: Emergency Medicine | Admitting: Emergency Medicine

## 2019-06-21 DIAGNOSIS — Z79899 Other long term (current) drug therapy: Secondary | ICD-10-CM | POA: Insufficient documentation

## 2019-06-21 DIAGNOSIS — Z87891 Personal history of nicotine dependence: Secondary | ICD-10-CM | POA: Diagnosis not present

## 2019-06-21 DIAGNOSIS — T401X1A Poisoning by heroin, accidental (unintentional), initial encounter: Secondary | ICD-10-CM | POA: Insufficient documentation

## 2019-06-21 DIAGNOSIS — T50901A Poisoning by unspecified drugs, medicaments and biological substances, accidental (unintentional), initial encounter: Secondary | ICD-10-CM

## 2019-06-21 NOTE — ED Triage Notes (Signed)
PT brought in by [facility]. PT was unresponsive upon arrival and had to be assisted with bag-valve mask for breathing due to breathing around 4 times a minute. PT was given 2mg  Narcan nasal and pt now alert and oriented. PT denies any SI/HI and says she doesn't remember taking any drugs today. PT states she has a history of snorting heroin.

## 2019-06-21 NOTE — ED Provider Notes (Signed)
[facility] EMERGENCY DEPARTMENT Provider Note   CSN: [id number] Arrival date & time: 06/21/19  1248     History Chief Complaint  Patient presents with  . Drug Overdose    Brittney Nicholson is a 29 y.o. female.  29 year old female with history of heroin abuse presents via EMS after accidental overdose, requiring respiratory assistance with BVM and 2 mg of Narcan. On arrival she is alert and oriented with normal O2 sats and respirations. States she does not remember using heroin but does remember riding in the car with her friend. History of overdose in the past. Denies any SI or HI. Denies any other substance use. Reports now she feels back to normal, denies chest pain, shortness of breath, cough, fevers, abdominal pain, nausea or vomiting. Sitting on the side of the bed typing on her cell phone and answering questions appropriately. States that she is supposed to be at work at 830 in her [employer] diet and she is requesting a way to get in contact with her manager, but states she is willing to stay for evaluation.        Past Medical History:  Diagnosis Date  . Anxiety   . Depression   . 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 Headache    with this pregnancy  . HSV-2 infection complicating pregnancy   . Opiate use   . Pneumonia   . Pregnant 12/13/2014  . Syncope   . Warts     Patient Active Problem List   Diagnosis Date Noted  . Labor and delivery, indication for care 04/11/2019  . Premature rupture of membranes 04/11/2019  . [redacted] weeks gestation of pregnancy 04/11/2019  . Vaginal bleeding in pregnancy, third trimester 04/02/2019  . 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 BV (bacterial vaginosis) 04/02/2019  . Drug use affecting pregnancy in third trimester   . Limited prenatal care in third trimester   . Pregnancy complicated by subutex maintenance, antepartum (HCC) 05/11/2018  . Opiate use 03/12/2018  . 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 Depression with anxiety  03/12/2018  . UTI (urinary tract infection) during pregnancy, second trimester 03/12/2018  . 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 Warts 08/18/2012    Past Surgical History:  Procedure Laterality Date  . BREAST LUMPECTOMY  2006  . CHOLECYSTECTOMY N/A 01/25/2013   Procedure: LAPAROSCOPIC CHOLECYSTECTOMY;  Surgeon: Dalia Heading, MD;  Location: [facility];  Service: General;  Laterality: N/A;  . right arm Right    has pins.  . TONSILLECTOMY    . TONSILLECTOMY  2011     OB History    Gravida  6   Para  4   Term  3   Preterm  1   AB  2   Living  4     SAB  2   TAB  0   Ectopic  0   Multiple  0   Live Births  4           Family History  Problem Relation Age of Onset  . Cancer Mother        uterine  . Diabetes Father   . Kidney failure Father     Social History   Tobacco Use  . Smoking status: Former Smoker    Packs/day: 1.00    Types: Cigarettes  . Smokeless tobacco: Never Used  . Tobacco comment: Quit September 2019  Substance Use Topics  . Alcohol use: No  . Drug use:  Yes    Types: Heroin    Comment: 2 weeks ago    Home Medications Prior to Admission medications   Medication Sig Start Date End Date Taking? Authorizing Provider  acetaminophen (TYLENOL) 325 MG tablet Take 2 tablets (650 mg total) by mouth every 4 (four) hours as needed (for pain scale < 4  OR  temperature  >/=  100.5 F). Patient not taking: Reported on 04/12/2019 04/09/19   Constant, Peggy, MD  acetaminophen (TYLENOL) 500 MG tablet Take 500 mg by mouth every 6 (six) hours as needed for mild pain.    [provider]  docusate sodium (COLACE) 100 MG capsule Take 1 capsule (100 mg total) by mouth daily. Patient not taking: Reported on 04/12/2019 04/09/19   Constant, Peggy, MD  Prenatal Vit-Fe Fumarate-FA (MULTIVITAMIN-PRENATAL) 27-0.8 MG TABS tablet Take 1 tablet by mouth daily at 12 noon.    [provider]  valACYclovir (VALTREX) 500 MG tablet Take 1  tablet (500 mg total) by mouth 2 (two) times daily. 04/09/19   Constant, Peggy, MD    Allergies    Gluten and Wheat  Review of Systems   Review of Systems  Constitutional: Negative for chills and fever.  HENT: Negative.   Eyes: Negative for visual disturbance.  Respiratory: Negative for cough and shortness of breath.   Cardiovascular: Negative for chest pain.  Gastrointestinal: Negative for abdominal pain, nausea and vomiting.  Genitourinary: Negative for dysuria and frequency.  Musculoskeletal: Negative for arthralgias and myalgias.  Skin: Negative for color change and rash.  Neurological: Negative for dizziness, syncope, light-headedness and headaches.  Psychiatric/Behavioral: Negative for hallucinations, self-injury and suicidal ideas.    Physical Exam Updated Vital Signs BP 117/83 (BP Location: Left Arm)   Pulse 95   Temp 98.5 F (36.9 C) (Oral)   Resp 18   Ht 5\' 9"  (1.753 m)   Wt 88.5 kg   LMP 06/14/2019   SpO2 100%   BMI 28.80 kg/m   Physical Exam Vitals and nursing note reviewed.  Constitutional:      General: She is not in acute distress.    Appearance: Normal appearance. She is well-developed and normal weight. She is not ill-appearing or diaphoretic.     Comments: Alert, oriented, well-appearing, typing on cell phone  HENT:     Head: Normocephalic and atraumatic.     Mouth/Throat:     Mouth: Mucous membranes are moist.     Pharynx: Oropharynx is clear.  Eyes:     General:        Right eye: No discharge.        Left eye: No discharge.  Cardiovascular:     Rate and Rhythm: Normal rate and regular rhythm.     Pulses: Normal pulses.     Heart sounds: Normal heart sounds.  Pulmonary:     Effort: Pulmonary effort is normal. No respiratory distress.     Breath sounds: Normal breath sounds. No wheezing or rales.     Comments: Respirations equal and unlabored, patient able to speak in full sentences, lungs clear to auscultation bilaterally Abdominal:      General: Bowel sounds are normal. There is no distension.     Palpations: Abdomen is soft. There is no mass.     Tenderness: There is no abdominal tenderness. There is no guarding.     Comments: Abdomen soft, nondistended, nontender to palpation in all quadrants without guarding or peritoneal signs  Musculoskeletal:        General:  No deformity.     Cervical back: Neck supple.  Skin:    General: Skin is warm and dry.     Capillary Refill: Capillary refill takes less than 2 seconds.  Neurological:     Mental Status: She is alert and oriented to person, place, and time.     Coordination: Coordination normal.     Comments: Alert, sitting up, able to answer all questions.  Speech is clear, able to follow commands Moves extremities without ataxia, coordination intact Ambulatory with steady gait  Psychiatric:        Mood and Affect: Mood normal.        Behavior: Behavior normal.     ED Results / Procedures / Treatments   Labs (all labs ordered are listed, but only abnormal results are displayed) Labs Reviewed - No data to display  EKG None  Radiology No results found.  Procedures Procedures (including critical care time)  Medications Ordered in ED Medications - No data to display  ED Course  I have reviewed the triage vital signs and the nursing notes.  Pertinent labs & imaging results that were available during my care of the patient were reviewed by me and considered in my medical decision making (see chart for details).  Clinical Course as of Jun 20 1416  Mon Jun 21, 2019  1300 Patient arrives via EMS after she was found unresponsive and had to have respiratory assistance with bag-valve-mask, was given 2 mg of Narcan and is now alert and oriented on arrival.  Does not remember taking drugs but denies any SI or HI.  History of similar overdoses in the past reports she snorts heroin.  Currently denies any complaints.  Is agreeable to staying for evaluation but needs to call  her employer.   [KF]  1408 Went to reassess patient in hallway bed and she was nowhere to be found, nursing staff checked throughout the department and in the bathrooms and it appears patient has eloped.   [KF]    Clinical Course User Index [KF] Janet Berlin   MDM Rules/Calculators/A&P                      29 year old female brought in by EMS after unintentional overdose, responded well to Narcan and now has normal respiratory rate and O2 sats.  Patient well-appearing with no acute complaints.  No reported SI or HI.  Patient is willing to stay for evaluation, discussed with her that typically we watch patients for 2 hours to ensure that substance that she overdosed on does not last longer than the Narcan.  Patient is agreeable.  Went to reassess patient and she was nowhere to be found in the department, patient eloped prior to completing evaluation.  Final Clinical Impression(s) / ED Diagnoses Final diagnoses:  Accidental drug overdose, initial encounter    Rx / DC Orders ED Discharge Orders    None       Janet Berlin 06/21/19 1419    Noemi Chapel, MD 06/21/19 1744

## 2019-06-21 NOTE — ED Notes (Signed)
Pt awake alert, looking on her cell phone.

## 2019-06-21 NOTE — ED Notes (Addendum)
Pt not in bed or bathrooms, apparently has left prior to discharge

## 2019-06-25 ENCOUNTER — Other Ambulatory Visit: Payer: Self-pay

## 2019-06-25 ENCOUNTER — Encounter (HOSPITAL_COMMUNITY): Payer: Self-pay | Admitting: Emergency Medicine

## 2019-06-25 ENCOUNTER — Emergency Department (HOSPITAL_COMMUNITY)
Admission: EM | Admit: 2019-06-25 | Discharge: 2019-06-25 | Disposition: A | Discharge status: Home / Self Care | Payer: Medicaid Other | Attending: Emergency Medicine | Admitting: Emergency Medicine

## 2019-06-25 DIAGNOSIS — Z5321 Procedure and treatment not carried out due to patient leaving prior to being seen by health care provider: Secondary | ICD-10-CM | POA: Insufficient documentation

## 2019-06-25 DIAGNOSIS — R4189 Other symptoms and signs involving cognitive functions and awareness: Secondary | ICD-10-CM | POA: Diagnosis present

## 2019-06-25 NOTE — ED Notes (Signed)
Pt refused to sign AMA form, dc IV access & stormed out of ED, MD aware

## 2019-06-25 NOTE — ED Triage Notes (Signed)
Pt arrives via [facility]. Per EMS pt has history of OD, pts boyfriend found pt unresponsive, FD arrived, started baging, total of 2.8 of narcan given, O2 originally 36%, 18 G IV initiated for narcan, vitals stable on truck. Pt arrives on RA @ 94%. Pt states the last thing she remembers is cooking pancakes this morning. Per boyfriend, prior history is heroin abuse.

## 2019-06-28 ENCOUNTER — Encounter (HOSPITAL_COMMUNITY): Payer: Self-pay | Admitting: Emergency Medicine

## 2019-06-28 ENCOUNTER — Other Ambulatory Visit: Payer: Self-pay

## 2019-06-28 ENCOUNTER — Emergency Department (HOSPITAL_COMMUNITY)
Admission: EM | Admit: 2019-06-28 | Discharge: 2019-06-28 | Disposition: A | Discharge status: Home / Self Care | Payer: Medicaid Other | Attending: Emergency Medicine | Admitting: Emergency Medicine

## 2019-06-28 DIAGNOSIS — Z5321 Procedure and treatment not carried out due to patient leaving prior to being seen by health care provider: Secondary | ICD-10-CM | POA: Insufficient documentation

## 2019-06-28 DIAGNOSIS — T401X4A Poisoning by heroin, undetermined, initial encounter: Secondary | ICD-10-CM | POA: Insufficient documentation

## 2019-06-28 NOTE — ED Triage Notes (Signed)
Ems called out by pt's mother for pt being groggy, pt has hx of using heroin.  Pt awake, alert and oriented x 4 at this time.

## 2019-06-28 NOTE — ED Notes (Signed)
Pt not in room . IV in trash can

## 2019-06-28 NOTE — ED Notes (Signed)
Mothers phone number [phone]

## 2019-06-29 ENCOUNTER — Emergency Department (HOSPITAL_COMMUNITY)
Admission: EM | Admit: 2019-06-29 | Discharge: 2019-06-29 | Discharge status: Against Medical Advice | Payer: Medicaid Other | Attending: Emergency Medicine | Admitting: Emergency Medicine

## 2019-06-29 DIAGNOSIS — R4020 Unspecified coma: Secondary | ICD-10-CM

## 2019-06-29 DIAGNOSIS — R519 Headache, unspecified: Secondary | ICD-10-CM | POA: Insufficient documentation

## 2019-06-29 DIAGNOSIS — R55 Syncope and collapse: Secondary | ICD-10-CM | POA: Insufficient documentation

## 2019-06-29 DIAGNOSIS — Z5320 Procedure and treatment not carried out because of patient's decision for unspecified reasons: Secondary | ICD-10-CM | POA: Insufficient documentation
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
## 2019-06-29 NOTE — ED Provider Notes (Signed)
[facility] EMERGENCY DEPARTMENT Provider Note   CSN: [id number] Arrival date & time: 06/29/19  1530     History Chief Complaint  Patient presents with  . Loss of Consciousness    Brittney Nicholson is a 29 y.o. female.  Patient here by EMS after being found lying on the sidewalk unconscious.  Patient does have a history of drug abuse but states her last use of heroin was about 2 weeks ago.  She states she snorts it and does not use injections.  She does not recall if she used any drugs today.  She complains of a headache and is uncertain if she hit her head.  Does not know when the headache started.  She is speaking on her phone in no distress.  GCS is 15 per EMS.  She denies any alcohol abuse.  She denies any difficulty breathing or chest pain.  Denies any possibility of pregnancy.  She denies any focal weakness, numbness, or tingling.  She denies any pain with urination or blood in the urine.  She denies any suicidal thoughts or homicidal thoughts.  States she is not hearing any voices.  The history is provided by the patient and the EMS personnel. The history is limited by the condition of the patient.  Loss of Consciousness Associated symptoms: weakness   Associated symptoms: no chest pain, no fever, no nausea, no shortness of breath and no vomiting        Past Medical History:  Diagnosis Date  . Anxiety   . Depression   . 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 Headache    with this pregnancy  . HSV-2 infection complicating pregnancy   . Opiate use   . Pneumonia   . Pregnant 12/13/2014  . Syncope   . Warts     Patient Active Problem List   Diagnosis Date Noted  . Labor and delivery, indication for care 04/11/2019  . Premature rupture of membranes 04/11/2019  . [redacted] weeks gestation of pregnancy 04/11/2019  . Vaginal bleeding in pregnancy, third trimester 04/02/2019  . 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 BV (bacterial vaginosis)  04/02/2019  . Drug use affecting pregnancy in third trimester   . Limited prenatal care in third trimester   . Pregnancy complicated by subutex maintenance, antepartum (HCC) 05/11/2018  . Opiate use 03/12/2018  . 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 Depression with anxiety 03/12/2018  . UTI (urinary tract infection) during pregnancy, second trimester 03/12/2018  . 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 Warts 08/18/2012    Past Surgical History:  Procedure Laterality Date  . BREAST LUMPECTOMY  2006  . CHOLECYSTECTOMY N/A 01/25/2013   Procedure: LAPAROSCOPIC CHOLECYSTECTOMY;  Surgeon: Dalia Heading, MD;  Location: [facility];  Service: General;  Laterality: N/A;  . right arm Right    has pins.  . TONSILLECTOMY    . TONSILLECTOMY  2011     OB History    Gravida  6   Para  4   Term  3   Preterm  1   AB  2   Living  4     SAB  2   TAB  0   Ectopic  0   Multiple  0   Live Births  4           Family History  Problem Relation Age of Onset  . Cancer Mother        uterine  .  Diabetes Father   . Kidney failure Father     Social History   Tobacco Use  . Smoking status: Former Smoker    Packs/day: 1.00    Types: Cigarettes  . Smokeless tobacco: Never Used  . Tobacco comment: Quit September 2019  Substance Use Topics  . Alcohol use: No  . Drug use: Yes    Types: Heroin    Comment: 2 weeks ago    Home Medications Prior to Admission medications   Medication Sig Start Date End Date Taking? Authorizing Provider  acetaminophen (TYLENOL) 325 MG tablet Take 2 tablets (650 mg total) by mouth every 4 (four) hours as needed (for pain scale < 4  OR  temperature  >/=  100.5 F). Patient not taking: Reported on 04/12/2019 04/09/19   Constant, Peggy, MD  acetaminophen (TYLENOL) 500 MG tablet Take 500 mg by mouth every 6 (six) hours as needed for mild pain.    [provider]  docusate sodium (COLACE) 100 MG capsule Take 1 capsule (100 mg total) by mouth  daily. Patient not taking: Reported on 04/12/2019 04/09/19   Constant, Peggy, MD  Prenatal Vit-Fe Fumarate-FA (MULTIVITAMIN-PRENATAL) 27-0.8 MG TABS tablet Take 1 tablet by mouth daily at 12 noon.    [provider]  valACYclovir (VALTREX) 500 MG tablet Take 1 tablet (500 mg total) by mouth 2 (two) times daily. 04/09/19   Constant, Peggy, MD    Allergies    Gluten and Wheat  Review of Systems   Review of Systems  Constitutional: Positive for fatigue. Negative for activity change, appetite change and fever.  HENT: Negative for congestion and rhinorrhea.   Respiratory: Negative for cough, chest tightness and shortness of breath.   Cardiovascular: Positive for syncope. Negative for chest pain.  Gastrointestinal: Negative for abdominal pain, nausea and vomiting.  Genitourinary: Negative for dysuria and hematuria.  Neurological: Positive for syncope and weakness.  Psychiatric/Behavioral: Negative for suicidal ideas.   all other systems are negative except as noted in the HPI and PMH.   Physical Exam Updated Vital Signs BP (!) 102/48 (BP Location: Right Arm)   Pulse (!) 110   Temp 98.5 F (36.9 C) (Oral)   Resp 16   Ht 5\' 9"  (1.753 m)   Wt 88 kg   LMP 06/23/2019   SpO2 97%   BMI 28.65 kg/m   Physical Exam Vitals and nursing note reviewed.  Constitutional:      General: She is not in acute distress.    Appearance: She is well-developed and normal weight.     Comments: Somnolent, somewhat slow to answer.   HENT:     Head: Normocephalic and atraumatic.     Mouth/Throat:     Pharynx: No oropharyngeal exudate.  Eyes:     Conjunctiva/sclera: Conjunctivae normal.     Pupils: Pupils are equal, round, and reactive to light.  Neck:     Comments: No meningismus. Cardiovascular:     Rate and Rhythm: Normal rate and regular rhythm.     Heart sounds: Normal heart sounds. No murmur.  Pulmonary:     Effort: Pulmonary effort is normal. No respiratory distress.     Breath  sounds: Normal breath sounds.  Chest:     Chest wall: No tenderness.  Abdominal:     Palpations: Abdomen is soft.     Tenderness: There is no abdominal tenderness. There is no guarding or rebound.  Musculoskeletal:        General: No tenderness. Normal range of  motion.     Cervical back: Normal range of motion and neck supple.  Skin:    General: Skin is warm.     Capillary Refill: Capillary refill takes less than 2 seconds.  Neurological:     General: No focal deficit present.     Mental Status: She is alert and oriented to person, place, and time. Mental status is at baseline.     Cranial Nerves: No cranial nerve deficit.     Motor: No abnormal muscle tone.     Coordination: Coordination normal.     Comments:  5/5 strength throughout. CN 2-12 intact.Equal grip strength.   Psychiatric:        Behavior: Behavior normal.     ED Results / Procedures / Treatments   Labs (all labs ordered are listed, but only abnormal results are displayed) Labs Reviewed  CBC WITH DIFFERENTIAL/PLATELET  COMPREHENSIVE METABOLIC PANEL  ETHANOL  ACETAMINOPHEN LEVEL  SALICYLATE LEVEL  URINALYSIS, ROUTINE W REFLEX MICROSCOPIC  RAPID URINE DRUG SCREEN, HOSP PERFORMED  I-STAT BETA HCG BLOOD, ED ([facility], [facility], [facility] ONLY)    EKG None  Radiology No results found.  Procedures Procedures (including critical care time)  Medications Ordered in ED Medications - No data to display  ED Course  I have reviewed the triage vital signs and the nursing notes.  Pertinent labs & imaging results that were available during my care of the patient were reviewed by me and considered in my medical decision making (see chart for details).    MDM Rules/Calculators/A&P                      Patient with history of heroin abuse here after unwitnessed syncopal episode. She is somnolent and answers questions slowly.  She is uncertain if she used any drugs today.  Patient is alert and oriented.  She denies any suicidal or  homicidal ideation. Denies hearing voices.   Plan to monitor, obtain EKG, labs.  Shortly after initial evaluation, patient was witnessed walking out of the department by pharmacy staff. She did not inform anybody that she was leaving. I was unable to speak with patient before she walked out.  She did not have an EKG or any work-up performed.   Final Clinical Impression(s) / ED Diagnoses Final diagnoses:  Loss of consciousness [facility])    Rx / DC Orders ED Discharge Orders    None       Ashyra Cantin, Jeannett Senior, MD 06/29/19 2027

## 2019-07-01 ENCOUNTER — Encounter (HOSPITAL_COMMUNITY): Payer: Self-pay | Admitting: Emergency Medicine

## 2019-07-01 ENCOUNTER — Other Ambulatory Visit: Payer: Self-pay

## 2019-07-01 ENCOUNTER — Emergency Department (HOSPITAL_COMMUNITY)
Admission: EM | Admit: 2019-07-01 | Discharge: 2019-07-02 | Disposition: A | Discharge status: Home / Self Care | Payer: Medicaid Other | Attending: Emergency Medicine | Admitting: Emergency Medicine

## 2019-07-01 DIAGNOSIS — Z5321 Procedure and treatment not carried out due to patient leaving prior to being seen by health care provider: Secondary | ICD-10-CM | POA: Insufficient documentation

## 2019-07-01 DIAGNOSIS — F1110 Opioid abuse, uncomplicated: Secondary | ICD-10-CM | POA: Diagnosis not present

## 2019-07-01 HISTORY — DX: Opioid dependence, uncomplicated: F11.20

## 2019-07-01 LAB — COMPREHENSIVE METABOLIC PANEL
ALT: 15 U/L (ref 0–44)
AST: 15 U/L (ref 15–41)
Albumin: 4.2 g/dL (ref 3.5–5.0)
Alkaline Phosphatase: 62 U/L (ref 38–126)
Anion gap: 10 (ref 5–15)
BUN: 9 mg/dL (ref 6–20)
CO2: 28 mmol/L (ref 22–32)
Calcium: 9.3 mg/dL (ref 8.9–10.3)
Chloride: 103 mmol/L (ref 98–111)
Creatinine, Ser: 0.79 mg/dL (ref 0.44–1.00)
GFR calc Af Amer: >60 mL/min (ref >60)
GFR calc non Af Amer: >60 mL/min (ref >60)
Glucose, Bld: 80 mg/dL (ref 70–99)
Potassium: 4.3 mmol/L (ref 3.5–5.1)
Sodium: 141 mmol/L (ref 135–145)
Total Bilirubin: 0.2 mg/dL — ABNORMAL LOW (ref 0.3–1.2)
Total Protein: 6.7 g/dL (ref 6.5–8.1)

## 2019-07-01 LAB — RAPID URINE DRUG SCREEN, HOSP PERFORMED
Amphetamines: NOT DETECTED
Barbiturates: NOT DETECTED
Benzodiazepines: NOT DETECTED
Cocaine: NOT DETECTED
Opiates: NOT DETECTED
Tetrahydrocannabinol: NOT DETECTED

## 2019-07-01 LAB — CBC
HCT: 43.1 % (ref 36.0–46.0)
Hemoglobin: 13.3 g/dL (ref 12.0–15.0)
MCH: 27 pg (ref 26.0–34.0)
MCHC: 30.9 g/dL (ref 30.0–36.0)
MCV: 87.6 fL (ref 80.0–100.0)
Platelets: 226 10*3/uL (ref 150–400)
RBC: 4.92 MIL/uL (ref 3.87–5.11)
RDW: 14.1 % (ref 11.5–15.5)
WBC: 8 10*3/uL (ref 4.0–10.5)
nRBC: 0 % (ref 0.0–0.2)

## 2019-07-01 LAB — ETHANOL: Alcohol, Ethyl (B): <10 mg/dL (ref <10)

## 2019-07-01 LAB — I-STAT BETA HCG BLOOD, ED (MC, WL, AP ONLY): I-stat hCG, quantitative: <5 m[IU]/mL (ref <5)

## 2019-07-01 NOTE — ED Triage Notes (Signed)
Patient requesting detox for her Heroin addiction , denies suicidal ideation / no hallucinations , tearful at triage .

## 2019-07-03 ENCOUNTER — Inpatient Hospital Stay (HOSPITAL_COMMUNITY)
Admission: EM | Admit: 2019-07-03 | Discharge: 2019-07-05 | DRG: 378 | Discharge status: Against Medical Advice | Payer: Medicaid Other | Attending: Internal Medicine | Admitting: Internal Medicine

## 2019-07-03 ENCOUNTER — Emergency Department (HOSPITAL_COMMUNITY): Payer: Medicaid Other

## 2019-07-03 ENCOUNTER — Encounter (HOSPITAL_COMMUNITY): Payer: Self-pay | Admitting: Emergency Medicine

## 2019-07-03 ENCOUNTER — Other Ambulatory Visit: Payer: Self-pay

## 2019-07-03 DIAGNOSIS — K920 Hematemesis: Principal | ICD-10-CM | POA: Diagnosis present

## 2019-07-03 DIAGNOSIS — Z91018 Allergy to other foods: Secondary | ICD-10-CM

## 2019-07-03 DIAGNOSIS — Z20822 Contact with and (suspected) exposure to covid-19: Secondary | ICD-10-CM | POA: Diagnosis present

## 2019-07-03 DIAGNOSIS — F17210 Nicotine dependence, cigarettes, uncomplicated: Secondary | ICD-10-CM | POA: Diagnosis present

## 2019-07-03 DIAGNOSIS — Z79899 Other long term (current) drug therapy: Secondary | ICD-10-CM

## 2019-07-03 DIAGNOSIS — F1120 Opioid dependence, uncomplicated: Secondary | ICD-10-CM | POA: Diagnosis present

## 2019-07-03 DIAGNOSIS — Z9049 Acquired absence of other specified parts of digestive tract: Secondary | ICD-10-CM

## 2019-07-03 DIAGNOSIS — F1290 Cannabis use, unspecified, uncomplicated: Secondary | ICD-10-CM | POA: Diagnosis not present

## 2019-07-03 DIAGNOSIS — Y900 Blood alcohol level of less than 20 mg/100 ml: Secondary | ICD-10-CM | POA: Diagnosis present

## 2019-07-03 DIAGNOSIS — F418 Other specified anxiety disorders: Secondary | ICD-10-CM | POA: Diagnosis not present

## 2019-07-03 DIAGNOSIS — Y92410 Unspecified street and highway as the place of occurrence of the external cause: Secondary | ICD-10-CM

## 2019-07-03 DIAGNOSIS — F1190 Opioid use, unspecified, uncomplicated: Secondary | ICD-10-CM | POA: Diagnosis not present

## 2019-07-03 DIAGNOSIS — K2970 Gastritis, unspecified, without bleeding: Secondary | ICD-10-CM | POA: Diagnosis present

## 2019-07-03 DIAGNOSIS — A6009 Herpesviral infection of other urogenital tract: Secondary | ICD-10-CM | POA: Diagnosis present

## 2019-07-03 DIAGNOSIS — F1210 Cannabis abuse, uncomplicated: Secondary | ICD-10-CM | POA: Diagnosis present

## 2019-07-03 LAB — RAPID URINE DRUG SCREEN, HOSP PERFORMED
Amphetamines: NOT DETECTED
Barbiturates: NOT DETECTED
Benzodiazepines: NOT DETECTED
Cocaine: NOT DETECTED
Opiates: NOT DETECTED
Tetrahydrocannabinol: NOT DETECTED

## 2019-07-03 LAB — CBC WITH DIFFERENTIAL/PLATELET
Abs Immature Granulocytes: 0.02 10*3/uL (ref 0.00–0.07)
Basophils Absolute: 0.1 10*3/uL (ref 0.0–0.1)
Basophils Relative: 1 %
Eosinophils Absolute: 0.2 10*3/uL (ref 0.0–0.5)
Eosinophils Relative: 2 %
HCT: 44.5 % (ref 36.0–46.0)
Hemoglobin: 13.9 g/dL (ref 12.0–15.0)
Immature Granulocytes: 0 %
Lymphocytes Relative: 15 %
Lymphs Abs: 1.4 10*3/uL (ref 0.7–4.0)
MCH: 27.3 pg (ref 26.0–34.0)
MCHC: 31.2 g/dL (ref 30.0–36.0)
MCV: 87.3 fL (ref 80.0–100.0)
Monocytes Absolute: 0.7 10*3/uL (ref 0.1–1.0)
Monocytes Relative: 8 %
Neutro Abs: 6.6 10*3/uL (ref 1.7–7.7)
Neutrophils Relative %: 74 %
Platelets: 226 10*3/uL (ref 150–400)
RBC: 5.1 MIL/uL (ref 3.87–5.11)
RDW: 14 % (ref 11.5–15.5)
WBC: 9 10*3/uL (ref 4.0–10.5)
nRBC: 0 % (ref 0.0–0.2)

## 2019-07-03 LAB — CBC
HCT: 36.2 % (ref 36.0–46.0)
HCT: 42.7 % (ref 36.0–46.0)
Hemoglobin: 11.1 g/dL — ABNORMAL LOW (ref 12.0–15.0)
Hemoglobin: 12.8 g/dL (ref 12.0–15.0)
MCH: 26.7 pg (ref 26.0–34.0)
MCH: 27.1 pg (ref 26.0–34.0)
MCHC: 30 g/dL (ref 30.0–36.0)
MCHC: 30.7 g/dL (ref 30.0–36.0)
MCV: 88.3 fL (ref 80.0–100.0)
MCV: 89.1 fL (ref 80.0–100.0)
Platelets: 179 10*3/uL (ref 150–400)
Platelets: 200 10*3/uL (ref 150–400)
RBC: 4.1 MIL/uL (ref 3.87–5.11)
RBC: 4.79 MIL/uL (ref 3.87–5.11)
RDW: 13.9 % (ref 11.5–15.5)
RDW: 14.1 % (ref 11.5–15.5)
WBC: 5.9 10*3/uL (ref 4.0–10.5)
WBC: 9.2 10*3/uL (ref 4.0–10.5)
nRBC: 0 % (ref 0.0–0.2)
nRBC: 0 % (ref 0.0–0.2)

## 2019-07-03 LAB — RESPIRATORY PANEL BY RT PCR (FLU A&B, COVID)
Influenza A by PCR: NEGATIVE
Influenza B by PCR: NEGATIVE
SARS Coronavirus 2 by RT PCR: NEGATIVE

## 2019-07-03 LAB — PROTIME-INR
INR: 0.9 (ref 0.8–1.2)
Prothrombin Time: 12.2 seconds (ref 11.4–15.2)

## 2019-07-03 LAB — COMPREHENSIVE METABOLIC PANEL
ALT: 19 U/L (ref 0–44)
AST: 21 U/L (ref 15–41)
Albumin: 4.6 g/dL (ref 3.5–5.0)
Alkaline Phosphatase: 57 U/L (ref 38–126)
Anion gap: 10 (ref 5–15)
BUN: 10 mg/dL (ref 6–20)
CO2: 27 mmol/L (ref 22–32)
Calcium: 9.7 mg/dL (ref 8.9–10.3)
Chloride: 104 mmol/L (ref 98–111)
Creatinine, Ser: 0.75 mg/dL (ref 0.44–1.00)
GFR calc Af Amer: >60 mL/min (ref >60)
GFR calc non Af Amer: >60 mL/min (ref >60)
Glucose, Bld: 112 mg/dL — ABNORMAL HIGH (ref 70–99)
Potassium: 3.9 mmol/L (ref 3.5–5.1)
Sodium: 141 mmol/L (ref 135–145)
Total Bilirubin: 0.6 mg/dL (ref 0.3–1.2)
Total Protein: 8.1 g/dL (ref 6.5–8.1)

## 2019-07-03 LAB — ETHANOL: Alcohol, Ethyl (B): <10 mg/dL (ref <10)

## 2019-07-03 LAB — HCG, QUANTITATIVE, PREGNANCY: hCG, Beta Chain, Quant, S: <1 m[IU]/mL (ref <5)

## 2019-07-03 MED ORDER — PHENOBARBITAL 32.4 MG PO TABS: 16.2000 mg | ORAL_TABLET | Freq: Two times a day (BID) | ORAL | Status: DC

## 2019-07-03 MED ORDER — FOLIC ACID 1 MG PO TABS
1.0000 mg | ORAL_TABLET | Freq: Every day | ORAL | Status: DC
  Administered 2019-07-03 – 2019-07-04 (×2): 1 mg via ORAL
  Filled 2019-07-03 (×2): qty 1

## 2019-07-03 MED ORDER — HYDROXYZINE HCL 25 MG PO TABS
25.0000 mg | ORAL_TABLET | Freq: Three times a day (TID) | ORAL | Status: DC
  Filled 2019-07-03 (×2): qty 1

## 2019-07-03 MED ORDER — ADULT MULTIVITAMIN W/MINERALS CH
1.0000 | ORAL_TABLET | Freq: Every day | ORAL | Status: DC
  Administered 2019-07-03 – 2019-07-05 (×3): 1 via ORAL
  Filled 2019-07-03 (×3): qty 1

## 2019-07-03 MED ORDER — PANTOPRAZOLE SODIUM 40 MG IV SOLR
INTRAVENOUS | Status: AC
  Filled 2019-07-03: qty 160

## 2019-07-03 MED ORDER — ACETAMINOPHEN 650 MG RE SUPP: 650.0000 mg | Freq: Four times a day (QID) | RECTAL | Status: DC | PRN

## 2019-07-03 MED ORDER — ONDANSETRON HCL 4 MG PO TABS: 4.0000 mg | ORAL_TABLET | Freq: Four times a day (QID) | ORAL | Status: DC | PRN

## 2019-07-03 MED ORDER — PHENOBARBITAL 32.4 MG PO TABS
64.8000 mg | ORAL_TABLET | Freq: Three times a day (TID) | ORAL | Status: AC
  Administered 2019-07-03 – 2019-07-04 (×2): 64.8 mg via ORAL
  Filled 2019-07-03 (×2): qty 2

## 2019-07-03 MED ORDER — DROPERIDOL 2.5 MG/ML IJ SOLN
1.2500 mg | Freq: Once | INTRAMUSCULAR | Status: AC
  Administered 2019-07-03: 1.25 mg via INTRAVENOUS
  Filled 2019-07-03: qty 2

## 2019-07-03 MED ORDER — TRAZODONE HCL 50 MG PO TABS
150.0000 mg | ORAL_TABLET | Freq: Every day | ORAL | Status: DC
  Administered 2019-07-03 – 2019-07-04 (×2): 150 mg via ORAL
  Filled 2019-07-03 (×2): qty 3

## 2019-07-03 MED ORDER — HYDROXYZINE HCL 25 MG PO TABS
50.0000 mg | ORAL_TABLET | Freq: Every day | ORAL | Status: DC
  Administered 2019-07-03: 50 mg via ORAL
  Filled 2019-07-03: qty 2

## 2019-07-03 MED ORDER — SODIUM CHLORIDE 0.9 % IV SOLN
80.0000 mg | Freq: Once | INTRAVENOUS | Status: AC
  Administered 2019-07-03: 80 mg via INTRAVENOUS
  Filled 2019-07-03: qty 80

## 2019-07-03 MED ORDER — SODIUM CHLORIDE 0.9 % IV SOLN: 80.0000 mg | Freq: Once | INTRAVENOUS | Status: DC

## 2019-07-03 MED ORDER — SODIUM CHLORIDE 0.9 % IV SOLN: 250.0000 mL | INTRAVENOUS | Status: DC | PRN

## 2019-07-03 MED ORDER — ALBUTEROL SULFATE (2.5 MG/3ML) 0.083% IN NEBU: 2.5000 mg | INHALATION_SOLUTION | RESPIRATORY_TRACT | Status: DC | PRN

## 2019-07-03 MED ORDER — PHENOBARBITAL 32.4 MG PO TABS: 32.4000 mg | ORAL_TABLET | ORAL | Status: DC

## 2019-07-03 MED ORDER — SODIUM CHLORIDE 0.9 % IV BOLUS
1000.0000 mL | Freq: Once | INTRAVENOUS | Status: AC
  Administered 2019-07-03: 1000 mL via INTRAVENOUS

## 2019-07-03 MED ORDER — DEXTROSE-NACL 5-0.45 % IV SOLN: INTRAVENOUS | Status: DC

## 2019-07-03 MED ORDER — SODIUM CHLORIDE 0.9% FLUSH
3.0000 mL | Freq: Two times a day (BID) | INTRAVENOUS | Status: DC
  Administered 2019-07-03 – 2019-07-05 (×3): 3 mL via INTRAVENOUS

## 2019-07-03 MED ORDER — ACETAMINOPHEN 325 MG PO TABS
650.0000 mg | ORAL_TABLET | Freq: Four times a day (QID) | ORAL | Status: DC | PRN
  Administered 2019-07-04: 650 mg via ORAL
  Filled 2019-07-03: qty 2

## 2019-07-03 MED ORDER — PHENOBARBITAL 32.4 MG PO TABS: 32.4000 mg | ORAL_TABLET | Freq: Two times a day (BID) | ORAL | Status: DC

## 2019-07-03 MED ORDER — ONDANSETRON HCL 4 MG/2ML IJ SOLN: 4.0000 mg | Freq: Four times a day (QID) | INTRAMUSCULAR | Status: DC | PRN

## 2019-07-03 MED ORDER — SODIUM CHLORIDE 0.9 % IV SOLN
8.0000 mg/h | INTRAVENOUS | Status: DC
  Administered 2019-07-03 – 2019-07-04 (×2): 8 mg/h via INTRAVENOUS
  Filled 2019-07-03 (×5): qty 80

## 2019-07-03 MED ORDER — PANTOPRAZOLE SODIUM 40 MG IV SOLR: 40.0000 mg | Freq: Two times a day (BID) | INTRAVENOUS | Status: DC

## 2019-07-03 MED ORDER — METHOCARBAMOL 500 MG PO TABS
1000.0000 mg | ORAL_TABLET | Freq: Four times a day (QID) | ORAL | Status: DC
  Administered 2019-07-03: 1000 mg via ORAL
  Filled 2019-07-03 (×2): qty 2

## 2019-07-03 MED ORDER — SODIUM CHLORIDE 0.9% FLUSH: 3.0000 mL | INTRAVENOUS | Status: DC | PRN

## 2019-07-03 MED ORDER — PHENOBARBITAL 32.4 MG PO TABS: 32.4000 mg | ORAL_TABLET | Freq: Three times a day (TID) | ORAL | Status: DC

## 2019-07-03 MED ORDER — IOHEXOL 350 MG/ML SOLN
100.0000 mL | Freq: Once | INTRAVENOUS | Status: AC | PRN
  Administered 2019-07-03: 100 mL via INTRAVENOUS

## 2019-07-03 MED ORDER — POLYETHYLENE GLYCOL 3350 17 G PO PACK
17.0000 g | PACK | Freq: Every day | ORAL | Status: DC
  Filled 2019-07-03 (×2): qty 1

## 2019-07-03 MED ORDER — THIAMINE HCL 100 MG PO TABS
100.0000 mg | ORAL_TABLET | Freq: Every day | ORAL | Status: DC
  Administered 2019-07-03 – 2019-07-04 (×2): 100 mg via ORAL
  Filled 2019-07-03 (×2): qty 1

## 2019-07-03 MED ORDER — PHENOBARBITAL SODIUM 65 MG/ML IJ SOLN: 130.0000 mg | Freq: Once | INTRAMUSCULAR | Status: DC

## 2019-07-03 MED ORDER — ONDANSETRON HCL 4 MG/2ML IJ SOLN
4.0000 mg | Freq: Once | INTRAMUSCULAR | Status: AC
  Administered 2019-07-03: 4 mg via INTRAVENOUS
  Filled 2019-07-03: qty 2

## 2019-07-03 MED ORDER — PANTOPRAZOLE SODIUM 40 MG IV SOLR
40.0000 mg | Freq: Once | INTRAVENOUS | Status: AC
  Administered 2019-07-03: 40 mg via INTRAVENOUS
  Filled 2019-07-03: qty 40

## 2019-07-03 MED ORDER — GABAPENTIN 300 MG PO CAPS
ORAL_CAPSULE | ORAL | Status: AC
  Filled 2019-07-03: qty 1

## 2019-07-03 MED ORDER — GABAPENTIN 600 MG PO TABS
300.0000 mg | ORAL_TABLET | Freq: Three times a day (TID) | ORAL | Status: DC
  Administered 2019-07-03: 300 mg via ORAL
  Filled 2019-07-03 (×5): qty 0.5

## 2019-07-03 MED ORDER — SENNOSIDES-DOCUSATE SODIUM 8.6-50 MG PO TABS
2.0000 | ORAL_TABLET | Freq: Two times a day (BID) | ORAL | Status: DC
  Administered 2019-07-03 – 2019-07-05 (×4): 2 via ORAL
  Filled 2019-07-03 (×8): qty 2

## 2019-07-03 MED ORDER — DIAZEPAM 2 MG PO TABS
2.0000 mg | ORAL_TABLET | Freq: Three times a day (TID) | ORAL | Status: DC
  Administered 2019-07-03: 2 mg via ORAL
  Filled 2019-07-03 (×2): qty 1
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
## 2019-07-03 NOTE — Plan of Care (Signed)
Plan of Care Reviewed 

## 2019-07-03 NOTE — ED Notes (Signed)
Coughed up small amount bright red substance, notified  ED PA
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
## 2019-07-04 ENCOUNTER — Observation Stay (HOSPITAL_COMMUNITY): Payer: Medicaid Other | Admitting: Anesthesiology

## 2019-07-04 ENCOUNTER — Encounter (HOSPITAL_COMMUNITY)
Admission: EM | Discharge status: Against Medical Advice | Payer: Self-pay | Source: Home / Self Care | Attending: Internal Medicine

## 2019-07-04 DIAGNOSIS — Z79899 Other long term (current) drug therapy: Secondary | ICD-10-CM | POA: Diagnosis not present

## 2019-07-04 DIAGNOSIS — F1190 Opioid use, unspecified, uncomplicated: Secondary | ICD-10-CM | POA: Diagnosis not present

## 2019-07-04 DIAGNOSIS — K920 Hematemesis: Principal | ICD-10-CM

## 2019-07-04 DIAGNOSIS — Z9049 Acquired absence of other specified parts of digestive tract: Secondary | ICD-10-CM | POA: Diagnosis not present

## 2019-07-04 DIAGNOSIS — T18128A Food in esophagus causing other injury, initial encounter: Secondary | ICD-10-CM

## 2019-07-04 DIAGNOSIS — F1290 Cannabis use, unspecified, uncomplicated: Secondary | ICD-10-CM

## 2019-07-04 DIAGNOSIS — F418 Other specified anxiety disorders: Secondary | ICD-10-CM | POA: Diagnosis present

## 2019-07-04 DIAGNOSIS — Y92410 Unspecified street and highway as the place of occurrence of the external cause: Secondary | ICD-10-CM | POA: Diagnosis not present

## 2019-07-04 DIAGNOSIS — F17210 Nicotine dependence, cigarettes, uncomplicated: Secondary | ICD-10-CM | POA: Diagnosis present

## 2019-07-04 DIAGNOSIS — Z20822 Contact with and (suspected) exposure to covid-19: Secondary | ICD-10-CM | POA: Diagnosis present

## 2019-07-04 DIAGNOSIS — Y900 Blood alcohol level of less than 20 mg/100 ml: Secondary | ICD-10-CM | POA: Diagnosis present

## 2019-07-04 DIAGNOSIS — K2970 Gastritis, unspecified, without bleeding: Secondary | ICD-10-CM

## 2019-07-04 DIAGNOSIS — A6009 Herpesviral infection of other urogenital tract: Secondary | ICD-10-CM | POA: Diagnosis present

## 2019-07-04 DIAGNOSIS — F1120 Opioid dependence, uncomplicated: Secondary | ICD-10-CM | POA: Diagnosis present

## 2019-07-04 DIAGNOSIS — F1210 Cannabis abuse, uncomplicated: Secondary | ICD-10-CM | POA: Diagnosis present

## 2019-07-04 DIAGNOSIS — Z91018 Allergy to other foods: Secondary | ICD-10-CM | POA: Diagnosis not present

## 2019-07-04 HISTORY — PX: ESOPHAGOGASTRODUODENOSCOPY (EGD) WITH PROPOFOL: SHX5813

## 2019-07-04 LAB — CBC
HCT: 36.3 % (ref 36.0–46.0)
HCT: 34.5 % — ABNORMAL LOW (ref 36.0–46.0)
Hemoglobin: 10.8 g/dL — ABNORMAL LOW (ref 12.0–15.0)
Hemoglobin: 10.5 g/dL — ABNORMAL LOW (ref 12.0–15.0)
MCH: 26 pg (ref 26.0–34.0)
MCH: 27.2 pg (ref 26.0–34.0)
MCHC: 29.8 g/dL — ABNORMAL LOW (ref 30.0–36.0)
MCHC: 30.4 g/dL (ref 30.0–36.0)
MCV: 87.5 fL (ref 80.0–100.0)
MCV: 89.4 fL (ref 80.0–100.0)
Platelets: 162 10*3/uL (ref 150–400)
Platelets: 157 10*3/uL (ref 150–400)
RBC: 4.15 MIL/uL (ref 3.87–5.11)
RBC: 3.86 MIL/uL — ABNORMAL LOW (ref 3.87–5.11)
RDW: 14.1 % (ref 11.5–15.5)
RDW: 14.2 % (ref 11.5–15.5)
WBC: 5.3 10*3/uL (ref 4.0–10.5)
WBC: 5.4 10*3/uL (ref 4.0–10.5)
nRBC: 0 % (ref 0.0–0.2)
nRBC: 0 % (ref 0.0–0.2)

## 2019-07-04 LAB — BASIC METABOLIC PANEL
Anion gap: 9 (ref 5–15)
BUN: 8 mg/dL (ref 6–20)
CO2: 24 mmol/L (ref 22–32)
Calcium: 8.6 mg/dL — ABNORMAL LOW (ref 8.9–10.3)
Chloride: 107 mmol/L (ref 98–111)
Creatinine, Ser: 0.74 mg/dL (ref 0.44–1.00)
GFR calc Af Amer: >60 mL/min (ref >60)
GFR calc non Af Amer: >60 mL/min (ref >60)
Glucose, Bld: 92 mg/dL (ref 70–99)
Potassium: 3.6 mmol/L (ref 3.5–5.1)
Sodium: 140 mmol/L (ref 135–145)

## 2019-07-04 LAB — TYPE AND SCREEN
ABO/RH(D): O POS
Antibody Screen: NEGATIVE

## 2019-07-04 SURGERY — ESOPHAGOGASTRODUODENOSCOPY (EGD) WITH PROPOFOL
Anesthesia: General

## 2019-07-04 MED ORDER — STERILE WATER FOR IRRIGATION IR SOLN
Status: DC | PRN
  Administered 2019-07-04: 12:00:00 2.5 mL

## 2019-07-04 MED ORDER — ONDANSETRON HCL 4 MG/2ML IJ SOLN
INTRAMUSCULAR | Status: AC
  Filled 2019-07-04: qty 2

## 2019-07-04 MED ORDER — SUCCINYLCHOLINE CHLORIDE 200 MG/10ML IV SOSY
PREFILLED_SYRINGE | INTRAVENOUS | Status: AC
  Filled 2019-07-04: qty 10

## 2019-07-04 MED ORDER — FOLIC ACID 5 MG/ML IJ SOLN
1.0000 mg | Freq: Every day | INTRAMUSCULAR | Status: DC
  Administered 2019-07-04 – 2019-07-05 (×2): 1 mg via INTRAVENOUS
  Filled 2019-07-04 (×2): qty 0.2

## 2019-07-04 MED ORDER — PROPOFOL 10 MG/ML IV BOLUS
INTRAVENOUS | Status: DC | PRN
  Administered 2019-07-04: 200 mg via INTRAVENOUS

## 2019-07-04 MED ORDER — FENTANYL CITRATE (PF) 100 MCG/2ML IJ SOLN
INTRAMUSCULAR | Status: DC | PRN
  Administered 2019-07-04: 100 ug via INTRAVENOUS

## 2019-07-04 MED ORDER — PANTOPRAZOLE SODIUM 40 MG IV SOLR
40.0000 mg | INTRAVENOUS | Status: DC
  Administered 2019-07-04 – 2019-07-05 (×2): 40 mg via INTRAVENOUS
  Filled 2019-07-04 (×2): qty 40

## 2019-07-04 MED ORDER — FENTANYL CITRATE (PF) 100 MCG/2ML IJ SOLN
INTRAMUSCULAR | Status: AC
  Filled 2019-07-04: qty 2

## 2019-07-04 MED ORDER — SUCCINYLCHOLINE CHLORIDE 20 MG/ML IJ SOLN
INTRAMUSCULAR | Status: DC | PRN
  Administered 2019-07-04: 120 mg via INTRAVENOUS

## 2019-07-04 MED ORDER — SUCCINYLCHOLINE CHLORIDE 20 MG/ML IJ SOLN: INTRAMUSCULAR | Status: DC | PRN

## 2019-07-04 MED ORDER — THIAMINE HCL 100 MG/ML IJ SOLN
100.0000 mg | Freq: Every day | INTRAMUSCULAR | Status: DC
  Administered 2019-07-04 – 2019-07-05 (×2): 100 mg via INTRAVENOUS
  Filled 2019-07-04 (×2): qty 2

## 2019-07-04 MED ORDER — LIDOCAINE HCL (CARDIAC) PF 50 MG/5ML IV SOSY
PREFILLED_SYRINGE | INTRAVENOUS | Status: DC | PRN
  Administered 2019-07-04: 100 mg via INTRAVENOUS

## 2019-07-04 MED ORDER — LIDOCAINE HCL (CARDIAC) PF 100 MG/5ML IV SOSY
PREFILLED_SYRINGE | INTRAVENOUS | Status: DC | PRN
  Administered 2019-07-04: 100 mg via INTRATRACHEAL

## 2019-07-04 MED ORDER — GLYCOPYRROLATE PF 0.2 MG/ML IJ SOSY
PREFILLED_SYRINGE | INTRAMUSCULAR | Status: AC
  Filled 2019-07-04: qty 1

## 2019-07-04 MED ORDER — LACTATED RINGERS IV SOLN: INTRAVENOUS | Status: DC | PRN

## 2019-07-04 MED ORDER — ONDANSETRON HCL 4 MG/2ML IJ SOLN
INTRAMUSCULAR | Status: DC | PRN
  Administered 2019-07-04: 4 mg via INTRAVENOUS

## 2019-07-04 MED ORDER — MIDAZOLAM HCL 2 MG/2ML IJ SOLN
INTRAMUSCULAR | Status: AC
  Filled 2019-07-04: qty 2

## 2019-07-04 MED ORDER — MIDAZOLAM HCL 5 MG/5ML IJ SOLN
INTRAMUSCULAR | Status: DC | PRN
  Administered 2019-07-04: 2 mg via INTRAVENOUS

## 2019-07-04 MED ORDER — GABAPENTIN 300 MG PO CAPS
300.0000 mg | ORAL_CAPSULE | Freq: Three times a day (TID) | ORAL | Status: DC
  Administered 2019-07-04: 300 mg via ORAL
  Filled 2019-07-04: qty 1

## 2019-07-04 MED ORDER — LIDOCAINE 2% (20 MG/ML) 5 ML SYRINGE
INTRAMUSCULAR | Status: AC
  Filled 2019-07-04: qty 5

## 2019-07-04 MED ORDER — PROPOFOL 10 MG/ML IV BOLUS
INTRAVENOUS | Status: AC
  Filled 2019-07-04: qty 60

## 2019-07-04 MED ORDER — KETAMINE HCL 50 MG/5ML IJ SOSY
PREFILLED_SYRINGE | INTRAMUSCULAR | Status: AC
  Filled 2019-07-04: qty 5
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
## 2019-07-04 NOTE — Progress Notes (Signed)
Brief EGD note.  Normal hypopharynx. ET tube in place. Patchy coating of esophageal mucosa with food debris. Normal esophageal mucosa. Normal GE junction at 40 cm from incisors. Food debris in proximal stomach. Patchy edema and erythema involving mucosa at gastric body and antrum. No blood clots noted in the stomach. Normal bulbar and post bulbar mucosa.

## 2019-07-04 NOTE — Anesthesia Postprocedure Evaluation (Signed)
Anesthesia Post Note  Patient: Brittney Nicholson  Procedure(s) Performed: ESOPHAGOGASTRODUODENOSCOPY (EGD) WITH PROPOFOL (N/A )  Patient location during evaluation: PACU Anesthesia Type: General Level of consciousness: awake and alert and oriented Pain management: pain level controlled Vital Signs Assessment: post-procedure vital signs reviewed and stable Respiratory status: spontaneous breathing Cardiovascular status: stable Postop Assessment: no apparent nausea or vomiting Anesthetic complications: no     Last Vitals:  Vitals:   07/04/19 1230 07/04/19 1245  BP: (!) 108/49 (!) 99/33  Pulse: 82 77  Resp: 12 15  Temp:    SpO2: 100% 100%    Last Pain:  Vitals:   07/04/19 1245  TempSrc:   PainSc: 0-No pain                 [name] C [name]

## 2019-07-04 NOTE — Anesthesia Preprocedure Evaluation (Signed)
Anesthesia Evaluation  Patient identified by MRN, date of birth, ID band Patient awake    Reviewed: Allergy & Precautions, NPO status , Patient's Chart, lab work & pertinent test results  History of Anesthesia Complications Negative for: history of anesthetic complications  Airway Mallampati: II  TM Distance: >3 FB Neck ROM: Full    Dental  (+) Missing, Chipped, Dental Advisory Given   Pulmonary pneumonia, resolved, Current Smoker and Patient abstained from smoking.,  IMPRESSION: No acute findings. No evidence of pneumonia or pulmonary edema.   Electronically Signed   By: Bary Richard M.D.   On: 07/03/2019 14:56   Pulmonary exam normal breath sounds clear to auscultation       Cardiovascular Exercise Tolerance: Good  Rhythm:Regular Rate:Normal  03-Jul-2019 14:39:15 [facility] ROUTINE RECORD Unknown rhythm, irregular rate Borderline Q waves in inferior leads   Neuro/Psych  Headaches, PSYCHIATRIC DISORDERS Anxiety Depression    GI/Hepatic (+)     substance abuse  , hematemesis    Endo/Other  negative endocrine ROS  Renal/GU negative Renal ROS     Musculoskeletal negative musculoskeletal ROS (+) narcotic dependent  Abdominal   Peds  Hematology negative hematology ROS (+)   Anesthesia Other Findings   Reproductive/Obstetrics negative OB ROS                             Anesthesia Physical Anesthesia Plan  ASA: III and emergent  Anesthesia Plan: General   Post-op Pain Management:    Induction: Intravenous  PONV Risk Score and Plan: 2 and Midazolam and Ondansetron  Airway Management Planned: Oral ETT  Additional Equipment:   Intra-op Plan:   Post-operative Plan: Possible Post-op intubation/ventilation and Extubation in OR  Informed Consent: I have reviewed the patients History and Physical, chart, labs and discussed the procedure including the  risks, benefits and alternatives for the proposed anesthesia with the patient or authorized representative who has indicated his/her understanding and acceptance.     Dental advisory given  Plan Discussed with:   Anesthesia Plan Comments:         Anesthesia Quick Evaluation

## 2019-07-04 NOTE — Anesthesia Procedure Notes (Signed)
Procedure Name: Intubation Date/Time: 07/04/2019 12:04 PM Performed by: Denese Killings, MD Pre-anesthesia Checklist: Patient identified, Emergency Drugs available, Suction available and Patient being monitored Patient Re-evaluated:Patient Re-evaluated prior to induction Oxygen Delivery Method: Circle system utilized Preoxygenation: Pre-oxygenation with 100% oxygen Induction Type: IV induction and Rapid sequence Laryngoscope Size: 3 and Mac Grade View: Grade I Tube type: Oral Tube size: 7.0 mm Number of attempts: 1 Airway Equipment and Method: Stylet and Oral airway Placement Confirmation: ETT inserted through vocal cords under direct vision,  positive ETCO2 and breath sounds checked- equal and bilateral Secured at: 23 cm Tube secured with: Tape Dental Injury: Teeth and Oropharynx as per pre-operative assessment

## 2019-07-04 NOTE — Op Note (Signed)
[facility] Patient Name: Brittney Nicholson Procedure Date: 07/04/2019 11:55 AM MRN: [id number] Date of Birth: 09/24/1990 Attending MD: Lionel December , MD CSN: [id number] Age: 29 Admit Type: Inpatient Procedure:                Upper GI endoscopy Indications:              Hematemesis Providers:                Lionel December, MD, Nena Polio, RN, Ina Homes,                            Technician Referring MD:             Vassie Loll, MD Medicines:                General Anesthesia Complications:            No immediate complications. Estimated Blood Loss:     Estimated blood loss: none. Procedure:                Pre-Anesthesia Assessment:                           - Prior to the procedure, a History and Physical                            was performed, and patient medications and                            allergies were reviewed. The patient's tolerance of                            previous anesthesia was also reviewed. The risks                            and benefits of the procedure and the sedation                            options and risks were discussed with the patient.                            All questions were answered, and informed consent                            was obtained. Prior Anticoagulants: The patient has                            taken no previous anticoagulant or antiplatelet                            agents. ASA Grade Assessment: III - A patient with                            severe systemic disease. After reviewing the risks  and benefits, the patient was deemed in                            satisfactory condition to undergo the procedure.                           After obtaining informed consent, the endoscope was                            passed under direct vision. Throughout the                            procedure, the patient's blood pressure, pulse, and                            oxygen saturations were  monitored continuously. The                            [id number]-[id number] ([id number]) scope was introduced through the                            mouth, and advanced to the second part of duodenum.                            The upper GI endoscopy was accomplished without                            difficulty. The patient tolerated the procedure                            well. Scope In: 12:09:53 PM Scope Out: 12:14:36 PM Total Procedure Duration: 0 hours 4 minutes 43 seconds  Findings:      The hypopharynx was normal.      The examined esophagus was normal.      The Z-line was regular and was found 40 cm from the incisors.      A small amount of food (residue) was found in the gastric fundus and in       the gastric body.      Patchy mild inflammation characterized by congestion (edema) and       erythema was found in the gastric body and in the gastric antrum.      The pylorus was normal.      The duodenal bulb and second portion of the duodenum were normal. Impression:               - Normal hypopharynx.                           - Food in the mid esophagus and in the distal                            esophagus.                           - Normal esophagus.                           -  Z-line regular, 40 cm from the incisors.                           - A small amount of food (residue) in the stomach.                           - Gastritis.                           - Normal pylorus.                           - Normal duodenal bulb and second portion of the                            duodenum.                           - No specimens collected. Moderate Sedation:      Per Anesthesia Care Recommendation:           - Return patient to hospital ward for ongoing care.                           - Clear liquid diet today.                           - Continue present medications.                           - Change Pantprazole to 4o mg IV24 hours.                           - Perform an H. pylori  serology.                           - Pulmonary consult. Procedure Code(s):        --- Professional ---                           [phone], Esophagogastroduodenoscopy, flexible,                            transoral; diagnostic, including collection of                            specimen(s) by brushing or washing, when performed                            (separate procedure) Diagnosis Code(s):        --- Professional ---                           W43.154M, Food in esophagus causing other injury,                            initial encounter  K29.70, Gastritis, unspecified, without bleeding                           K92.0, Hematemesis CPT copyright 2019 [employer]. All rights reserved. The codes documented in this report are preliminary and upon coder review may  be revised to meet current compliance requirements. Lionel December, MD Lionel December, MD 07/04/2019 12:26:45 PM This report has been signed electronically. Number of Addenda: 0

## 2019-07-04 NOTE — Transfer of Care (Signed)
Immediate Anesthesia Transfer of Care Note  Patient: Brittney Nicholson  Procedure(s) Performed: ESOPHAGOGASTRODUODENOSCOPY (EGD) WITH PROPOFOL (N/A )  Patient Location: PACU  Anesthesia Type:General  Level of Consciousness: awake, alert  and oriented  Airway & Oxygen Therapy: Patient Spontanous Breathing  Post-op Assessment: Report given to RN and Post -op Vital signs reviewed and stable  Post vital signs: stable  Last Vitals:  Vitals Value Taken Time  BP 108/49 07/04/19 1230  Temp 37.1 C 07/04/19 1225  Pulse 71 07/04/19 1239  Resp 15 07/04/19 1239  SpO2 100 % 07/04/19 1239  Vitals shown include unvalidated device data.  Last Pain:  Vitals:   07/04/19 1225  TempSrc:   PainSc: 0-No pain      Patients Stated Pain Goal: 6 (07/04/19 1225)  Complications: No apparent anesthesia complications

## 2019-07-04 NOTE — Progress Notes (Signed)
Patient vomited moderate amount of blood tinged emesis. MD made aware. Will continue to assess throughout shift.

## 2019-07-04 NOTE — Progress Notes (Signed)
Patient very drowsy this am. Patient more alert at this time. Patient able to answer questions and hold conversation without drifting to sleep. Patient took medication with out any issues noted. Will continue to assess throughout shift.
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
## 2019-07-05 LAB — H. PYLORI ANTIBODY, IGG: H Pylori IgG: 0.19 Index Value (ref 0.00–0.79)
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
## 2019-07-09 ENCOUNTER — Encounter (HOSPITAL_COMMUNITY): Payer: Self-pay | Admitting: Family

## 2019-07-09 ENCOUNTER — Emergency Department (HOSPITAL_COMMUNITY)
Admission: EM | Admit: 2019-07-09 | Discharge: 2019-07-09 | Disposition: A | Discharge status: Other Acute Inpatient Hospital | Payer: Medicaid Other | Attending: Emergency Medicine | Admitting: Emergency Medicine

## 2019-07-09 ENCOUNTER — Encounter (HOSPITAL_COMMUNITY): Payer: Self-pay

## 2019-07-09 ENCOUNTER — Other Ambulatory Visit: Payer: Self-pay

## 2019-07-09 ENCOUNTER — Inpatient Hospital Stay (HOSPITAL_COMMUNITY)
Admission: AD | Admit: 2019-07-09 | Discharge: 2019-07-12 | DRG: 885 | Disposition: A | Discharge status: Home / Self Care | Payer: Medicaid Other | Source: Intra-hospital | Attending: Psychiatry | Admitting: Psychiatry

## 2019-07-09 DIAGNOSIS — F332 Major depressive disorder, recurrent severe without psychotic features: Secondary | ICD-10-CM | POA: Insufficient documentation

## 2019-07-09 DIAGNOSIS — Z91410 Personal history of adult physical and sexual abuse: Secondary | ICD-10-CM

## 2019-07-09 DIAGNOSIS — F1120 Opioid dependence, uncomplicated: Secondary | ICD-10-CM | POA: Diagnosis present

## 2019-07-09 DIAGNOSIS — Z79899 Other long term (current) drug therapy: Secondary | ICD-10-CM | POA: Diagnosis not present

## 2019-07-09 DIAGNOSIS — F17210 Nicotine dependence, cigarettes, uncomplicated: Secondary | ICD-10-CM | POA: Diagnosis present

## 2019-07-09 DIAGNOSIS — Z20822 Contact with and (suspected) exposure to covid-19: Secondary | ICD-10-CM | POA: Insufficient documentation

## 2019-07-09 DIAGNOSIS — Z841 Family history of disorders of kidney and ureter: Secondary | ICD-10-CM | POA: Diagnosis not present

## 2019-07-09 DIAGNOSIS — Z818 Family history of other mental and behavioral disorders: Secondary | ICD-10-CM | POA: Diagnosis not present

## 2019-07-09 DIAGNOSIS — Z809 Family history of malignant neoplasm, unspecified: Secondary | ICD-10-CM

## 2019-07-09 DIAGNOSIS — Z833 Family history of diabetes mellitus: Secondary | ICD-10-CM | POA: Diagnosis not present

## 2019-07-09 DIAGNOSIS — T40601A Poisoning by unspecified narcotics, accidental (unintentional), initial encounter: Secondary | ICD-10-CM

## 2019-07-09 LAB — RAPID URINE DRUG SCREEN, HOSP PERFORMED
Amphetamines: NOT DETECTED
Barbiturates: POSITIVE — AB
Benzodiazepines: POSITIVE — AB
Cocaine: NOT DETECTED
Opiates: NOT DETECTED
Tetrahydrocannabinol: NOT DETECTED

## 2019-07-09 LAB — COMPREHENSIVE METABOLIC PANEL
ALT: 22 U/L (ref 0–44)
AST: 24 U/L (ref 15–41)
Albumin: 4.6 g/dL (ref 3.5–5.0)
Alkaline Phosphatase: 59 U/L (ref 38–126)
Anion gap: 10 (ref 5–15)
BUN: 9 mg/dL (ref 6–20)
CO2: 25 mmol/L (ref 22–32)
Calcium: 9.2 mg/dL (ref 8.9–10.3)
Chloride: 106 mmol/L (ref 98–111)
Creatinine, Ser: 0.85 mg/dL (ref 0.44–1.00)
GFR calc Af Amer: >60 mL/min (ref >60)
GFR calc non Af Amer: >60 mL/min (ref >60)
Glucose, Bld: 164 mg/dL — ABNORMAL HIGH (ref 70–99)
Potassium: 3.2 mmol/L — ABNORMAL LOW (ref 3.5–5.1)
Sodium: 141 mmol/L (ref 135–145)
Total Bilirubin: 0.7 mg/dL (ref 0.3–1.2)
Total Protein: 7.8 g/dL (ref 6.5–8.1)

## 2019-07-09 LAB — CBC WITH DIFFERENTIAL/PLATELET
Abs Immature Granulocytes: 0.07 10*3/uL (ref 0.00–0.07)
Basophils Absolute: 0.1 10*3/uL (ref 0.0–0.1)
Basophils Relative: 1 %
Eosinophils Absolute: 0.1 10*3/uL (ref 0.0–0.5)
Eosinophils Relative: 1 %
HCT: 44.8 % (ref 36.0–46.0)
Hemoglobin: 13.9 g/dL (ref 12.0–15.0)
Immature Granulocytes: 1 %
Lymphocytes Relative: 8 %
Lymphs Abs: 1 10*3/uL (ref 0.7–4.0)
MCH: 26.9 pg (ref 26.0–34.0)
MCHC: 31 g/dL (ref 30.0–36.0)
MCV: 86.7 fL (ref 80.0–100.0)
Monocytes Absolute: 0.6 10*3/uL (ref 0.1–1.0)
Monocytes Relative: 5 %
Neutro Abs: 10.6 10*3/uL — ABNORMAL HIGH (ref 1.7–7.7)
Neutrophils Relative %: 84 %
Platelets: 279 10*3/uL (ref 150–400)
RBC: 5.17 MIL/uL — ABNORMAL HIGH (ref 3.87–5.11)
RDW: 13.6 % (ref 11.5–15.5)
WBC: 12.4 10*3/uL — ABNORMAL HIGH (ref 4.0–10.5)
nRBC: 0 % (ref 0.0–0.2)

## 2019-07-09 LAB — URINALYSIS, ROUTINE W REFLEX MICROSCOPIC
Bilirubin Urine: NEGATIVE
Glucose, UA: NEGATIVE mg/dL
Hgb urine dipstick: NEGATIVE
Ketones, ur: NEGATIVE mg/dL
Nitrite: NEGATIVE
Protein, ur: 30 mg/dL — AB
Specific Gravity, Urine: 1.017 (ref 1.005–1.030)
pH: 7 (ref 5.0–8.0)

## 2019-07-09 LAB — RESPIRATORY PANEL BY RT PCR (FLU A&B, COVID)
Influenza A by PCR: NEGATIVE
Influenza B by PCR: NEGATIVE
SARS Coronavirus 2 by RT PCR: NEGATIVE

## 2019-07-09 MED ORDER — ONDANSETRON 4 MG PO TBDP
4.0000 mg | ORAL_TABLET | Freq: Four times a day (QID) | ORAL | Status: DC | PRN
  Administered 2019-07-11: 10:00:00 4 mg via ORAL
  Filled 2019-07-09: qty 1

## 2019-07-09 MED ORDER — NAPROXEN 500 MG PO TABS
500.0000 mg | ORAL_TABLET | Freq: Two times a day (BID) | ORAL | Status: DC | PRN
  Administered 2019-07-10 – 2019-07-11 (×3): 500 mg via ORAL
  Filled 2019-07-09 (×4): qty 1

## 2019-07-09 MED ORDER — MAGNESIUM HYDROXIDE 400 MG/5ML PO SUSP: 30.0000 mL | Freq: Every day | ORAL | Status: DC | PRN

## 2019-07-09 MED ORDER — DICYCLOMINE HCL 20 MG PO TABS: 20.0000 mg | ORAL_TABLET | Freq: Four times a day (QID) | ORAL | Status: DC | PRN

## 2019-07-09 MED ORDER — LOPERAMIDE HCL 2 MG PO CAPS
2.0000 mg | ORAL_CAPSULE | ORAL | Status: DC | PRN
  Administered 2019-07-10: 22:00:00 4 mg via ORAL
  Filled 2019-07-09: qty 2

## 2019-07-09 MED ORDER — POTASSIUM CHLORIDE CRYS ER 20 MEQ PO TBCR
40.0000 meq | EXTENDED_RELEASE_TABLET | Freq: Once | ORAL | Status: AC
  Administered 2019-07-09: 40 meq via ORAL
  Filled 2019-07-09: qty 2

## 2019-07-09 MED ORDER — ALUM & MAG HYDROXIDE-SIMETH 200-200-20 MG/5ML PO SUSP: 30.0000 mL | ORAL | Status: DC | PRN

## 2019-07-09 MED ORDER — ONDANSETRON 4 MG PO TBDP
4.0000 mg | ORAL_TABLET | Freq: Once | ORAL | Status: AC
  Administered 2019-07-09: 18:00:00 4 mg via ORAL
  Filled 2019-07-09: qty 1

## 2019-07-09 MED ORDER — ACETAMINOPHEN 325 MG PO TABS
650.0000 mg | ORAL_TABLET | Freq: Four times a day (QID) | ORAL | Status: DC | PRN
  Administered 2019-07-09: 23:00:00 650 mg via ORAL
  Filled 2019-07-09: qty 2

## 2019-07-09 MED ORDER — NALOXONE HCL 0.4 MG/ML IJ SOLN: 1.0000 mg | Freq: Once | INTRAMUSCULAR | Status: DC

## 2019-07-09 MED ORDER — HYDROXYZINE HCL 25 MG PO TABS
25.0000 mg | ORAL_TABLET | Freq: Three times a day (TID) | ORAL | Status: DC | PRN
  Administered 2019-07-09 – 2019-07-12 (×5): 25 mg via ORAL
  Filled 2019-07-09 (×5): qty 1

## 2019-07-09 MED ORDER — CLONIDINE HCL 0.1 MG PO TABS: 0.1000 mg | ORAL_TABLET | Freq: Every day | ORAL | Status: DC

## 2019-07-09 MED ORDER — METHOCARBAMOL 500 MG PO TABS
500.0000 mg | ORAL_TABLET | Freq: Three times a day (TID) | ORAL | Status: DC | PRN
  Administered 2019-07-10 – 2019-07-11 (×3): 500 mg via ORAL
  Filled 2019-07-09 (×4): qty 1

## 2019-07-09 MED ORDER — NALOXONE HCL 2 MG/2ML IJ SOSY
PREFILLED_SYRINGE | INTRAMUSCULAR | Status: AC
  Filled 2019-07-09: qty 2

## 2019-07-09 MED ORDER — CLONIDINE HCL 0.1 MG PO TABS
0.1000 mg | ORAL_TABLET | Freq: Four times a day (QID) | ORAL | Status: AC
  Administered 2019-07-10 – 2019-07-11 (×5): 0.1 mg via ORAL
  Filled 2019-07-09 (×10): qty 1

## 2019-07-09 MED ORDER — LOPERAMIDE HCL 2 MG PO CAPS
4.0000 mg | ORAL_CAPSULE | Freq: Once | ORAL | Status: AC
  Administered 2019-07-09: 4 mg via ORAL
  Filled 2019-07-09: qty 2

## 2019-07-09 MED ORDER — CLONIDINE HCL 0.1 MG PO TABS
0.1000 mg | ORAL_TABLET | ORAL | Status: DC
  Filled 2019-07-09 (×4): qty 1

## 2019-07-09 NOTE — ED Notes (Signed)
Called [facility] said be here in hour

## 2019-07-09 NOTE — BHH Counselor (Signed)
TTS received a phone call from [facility] [phone] in regards to this patient- they state that they contacted EMS to transport patient to ED. This counselor informed them that patient did not give consent for TTS to discuss her care with them. Treatment center stated that she was to be terminated from their program as she just started yesterday and was given a Suboxone prescription. Dr. Mariann Laster states she recommends [facility] for detox. This counselor explained that medications administered would be at the discretion of hospital provider but that I would pass her recommendation on.

## 2019-07-09 NOTE — ED Provider Notes (Signed)
[facility] EMERGENCY DEPARTMENT Provider Note   CSN: [id number] Arrival date & time: 07/09/19  1257     History Chief Complaint  Patient presents with  . Drug Overdose    Brittney Nicholson is a 29 y.o. female.  Patient was brought in for overdose. Patient has a history of narcotic abuse. She was lethargic when the paramedics got there but did not need Narcan  The history is provided by the patient and the EMS personnel. No language interpreter was used.  Drug Overdose This is a recurrent problem. The current episode started less than 1 hour ago. The problem occurs constantly. The problem has not changed since onset.Pertinent negatives include no chest pain. Nothing aggravates the symptoms.       Past Medical History:  Diagnosis Date  . Anxiety   . Depression   . 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 Headache    with this pregnancy  . Heroin addiction (HCC)   . HSV-2 infection complicating pregnancy   . Opiate use   . Pneumonia   . Pregnant 12/13/2014  . Syncope   . Warts     Patient Active Problem List   Diagnosis Date Noted  . Hematemesis 07/03/2019  . Labor and delivery, indication for care 04/11/2019  . Premature rupture of membranes 04/11/2019  . [redacted] weeks gestation of pregnancy 04/11/2019  . Vaginal bleeding in pregnancy, third trimester 04/02/2019  . 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 BV (bacterial vaginosis) 04/02/2019  . Drug use affecting pregnancy in third trimester   . Limited prenatal care in third trimester   . Pregnancy complicated by subutex maintenance, antepartum (HCC) 05/11/2018  . Opiate use 03/12/2018  . 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 Depression with anxiety 03/12/2018  . UTI (urinary tract infection) during pregnancy, second trimester 03/12/2018  . 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 Warts 08/18/2012    Past Surgical History:  Procedure Laterality Date  . BREAST LUMPECTOMY  2006  . CHOLECYSTECTOMY N/A  01/25/2013   Procedure: LAPAROSCOPIC CHOLECYSTECTOMY;  Surgeon: Dalia Heading, MD;  Location: [facility] ORS;  Service: General;  Laterality: N/A;  . ESOPHAGOGASTRODUODENOSCOPY (EGD) WITH PROPOFOL N/A 07/04/2019   Procedure: ESOPHAGOGASTRODUODENOSCOPY (EGD) WITH PROPOFOL;  Surgeon: Malissa Hippo, MD;  Location: [facility] ENDO SUITE;  Service: Endoscopy;  Laterality: N/A;  . right arm Right    has pins.  . TONSILLECTOMY    . TONSILLECTOMY  2011     OB History    Gravida  6   Para  4   Term  3   Preterm  1   AB  2   Living  4     SAB  2   TAB  0   Ectopic  0   Multiple  0   Live Births  4           Family History  Problem Relation Age of Onset  . Cancer Mother        uterine  . Diabetes Father   . Kidney failure Father     Social History   Tobacco Use  . Smoking status: Current Every Day Smoker    Packs/day: 1.00    Types: Cigarettes  . Smokeless tobacco: Never Used  Substance Use Topics  . Alcohol use: No  . Drug use: Yes    Types: Heroin    Comment: Heroin Addict    Home Medications Prior to Admission medications   Medication Sig Start  Date End Date Taking? Authorizing Provider  acetaminophen (TYLENOL) 325 MG tablet Take 2 tablets (650 mg total) by mouth every 4 (four) hours as needed (for pain scale < 4  OR  temperature  >/=  100.5 F). Patient not taking: Reported on 04/12/2019 04/09/19   Constant, Peggy, MD  acetaminophen (TYLENOL) 500 MG tablet Take 500 mg by mouth every 6 (six) hours as needed for mild pain.    [provider]  docusate sodium (COLACE) 100 MG capsule Take 1 capsule (100 mg total) by mouth daily. Patient not taking: Reported on 04/12/2019 04/09/19   Constant, Peggy, MD  Prenatal Vit-Fe Fumarate-FA (MULTIVITAMIN-PRENATAL) 27-0.8 MG TABS tablet Take 1 tablet by mouth daily at 12 noon.    [provider]  valACYclovir (VALTREX) 500 MG tablet Take 1 tablet (500 mg total) by mouth 2 (two) times daily. 04/09/19   Constant, Peggy,  MD    Allergies    Gluten and Wheat  Review of Systems   Review of Systems  Unable to perform ROS: Mental status change  Cardiovascular: Negative for chest pain.    Physical Exam Updated Vital Signs BP (!) 113/59   Pulse (!) 107   Temp 98.7 F (37.1 C) (Oral)   Resp 16   Ht 5\' 7"  (1.702 m)   Wt 90 kg   LMP 06/23/2019   SpO2 100%   BMI 31.08 kg/m   Physical Exam Vitals reviewed.  Constitutional:      Appearance: She is well-developed.     Comments: Lethargic  HENT:     Head: Normocephalic.     Nose: Nose normal.  Eyes:     General: No scleral icterus.    Conjunctiva/sclera: Conjunctivae normal.  Neck:     Thyroid: No thyromegaly.  Cardiovascular:     Rate and Rhythm: Normal rate and regular rhythm.     Heart sounds: No murmur. No friction rub. No gallop.   Pulmonary:     Breath sounds: No stridor. No wheezing or rales.  Chest:     Chest wall: No tenderness.  Abdominal:     General: There is no distension.     Tenderness: There is no abdominal tenderness. There is no rebound.  Musculoskeletal:        General: Normal range of motion.     Cervical back: Neck supple.  Lymphadenopathy:     Cervical: No cervical adenopathy.  Skin:    Findings: No erythema or rash.  Neurological:     Mental Status: She is oriented to person, place, and time.     Motor: No abnormal muscle tone.     Coordination: Coordination normal.  Psychiatric:     Comments: Depressed     ED Results / Procedures / Treatments   Labs (all labs ordered are listed, but only abnormal results are displayed) Labs Reviewed  CBC WITH DIFFERENTIAL/PLATELET - Abnormal; Notable for the following components:      Result Value   WBC 12.4 (*)    RBC 5.17 (*)    Neutro Abs 10.6 (*)    All other components within normal limits  COMPREHENSIVE METABOLIC PANEL - Abnormal; Notable for the following components:   Potassium 3.2 (*)    Glucose, Bld 164 (*)    All other components within normal limits    URINALYSIS, ROUTINE W REFLEX MICROSCOPIC  RAPID URINE DRUG SCREEN, HOSP PERFORMED    EKG EKG Interpretation  Date/Time:  Friday July 09 2019 13:03:51 EST Ventricular Rate:  110  PR Interval:    QRS Duration: 92 QT Interval:  335 QTC Calculation: 454 R Axis:   79 Text Interpretation: Sinus tachycardia Borderline T wave abnormalities Confirmed by Bethann Berkshire [id number]) on 07/09/2019 3:00:03 PM   Radiology No results found.  Procedures Procedures (including critical care time)  Medications Ordered in ED Medications  naloxone (NARCAN) 2 MG/2ML injection (has no administration in time range)  naloxone [facility]) injection 1 mg (has no administration in time range)  potassium chloride SA (KLOR-CON) CR tablet 40 mEq (has no administration in time range)    ED Course  I have reviewed the triage vital signs and the nursing notes.  Pertinent labs & imaging results that were available during my care of the patient were reviewed by me and considered in my medical decision making (see chart for details).    MDM Rules/Calculators/A&P                      Patient needed 1 mg of Narcan because of narcotic overdose. Labs unremarkable. Patient will be evaluated by behavioral health because of depression and suicidal thoughts Final Clinical Impression(s) / ED Diagnoses Final diagnoses:  None    Rx / DC Orders ED Discharge Orders    None       Bethann Berkshire, MD 07/09/19 [phone]

## 2019-07-09 NOTE — BHH Counselor (Signed)
Disposition:  Brittney Nicholson, PMHNP recommends in patient treatment. She states patient may take Suboxone at [facility] if she has an active prescription. Per [place] at [facility], patient accepted to 301-1 after 1630 and pending a negative covid test. [facility] ED notified.

## 2019-07-09 NOTE — BH Assessment (Addendum)
Tele Assessment Note   Patient Name: Brittney Nicholson MRN: [id number] Referring Physician: Roderic Palau Location of Patient: [facility] Location of Provider: [facility] is an 29 y.o. female presenting voluntarily to [facility] ED via EMS after a heroin overdose. Patient states she does not recall what exactly happened, how much she took, or how she got to the ED. She states that she has had several recent overdoses. Patient is tearful during assessment and cries heavily at times. Patient denies SI to this assessor, however reported SI to ED staff. Patient denies HI/AVH. Patient reports she began using heroin in 2018 after her sister committed suicide. She was clean from November 2019- May 2020. Patient states she has a suboxone prescription. She states she uses $20-$40 worth on daily basis. She denies any other substance abuse. Patient reports she has 4 children, all of which are in DSS custody. One was just born in October. She is working with DSS to regain custody. Patient states she has always struggled with depression but has really struggled with post partum depression. She endorses symptoms of hopelessness, worthlessness, irritability, fatigue, anhedonia, insomnia, crying spells, and social isolation. She does not currently have a mental health provider or take any medications.   Patient gives consent for TTS to contact her mother, Brittney Nicholson [phone], for collateral information if necessary.  Patient is alert and oriented x 4. She is dressed in scrubs, sitting upright in bed during assessment. Her speech is logical, eye contact is good, and her thoughts are organized. Her mood is depressed and her affect is congruent. She has fair insight, judgement, and impulse control. She does not appear to be responding to internal stimuli or experiencing delusional thought content.   Diagnosis: F33.2 MDD, recurrent, severe   F11.20 Opioid use disorder, severe  Past Medical  History:  Past Medical History:  Diagnosis Date  . Anxiety   . Depression   . 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 Headache    with this pregnancy  . Heroin addiction ([place])   . HSV-2 infection complicating pregnancy   . Opiate use   . Pneumonia   . Pregnant 12/13/2014  . Syncope   . Warts     Past Surgical History:  Procedure Laterality Date  . BREAST LUMPECTOMY  2006  . CHOLECYSTECTOMY N/A 01/25/2013   Procedure: LAPAROSCOPIC CHOLECYSTECTOMY;  Surgeon: Jamesetta So, MD;  Location: [facility]S;  Service: General;  Laterality: N/A;  . ESOPHAGOGASTRODUODENOSCOPY (EGD) WITH PROPOFOL N/A 07/04/2019   Procedure: ESOPHAGOGASTRODUODENOSCOPY (EGD) WITH PROPOFOL;  Surgeon: Rogene Houston, MD;  Location: [facility] ENDO SU[facility];  Service: Endoscopy;  Laterality: N/A;  . right arm Right    has pins.  . TONSILLECTOMY    . TONSILLECTOMY  2011    Family History:  Family History  Problem Relation Age of Onset  . Cancer Mother        uterine  . Diabetes Father   . Kidney failure Father     Social History:  reports that she has been smoking cigarettes. She has been smoking about 1.00 pack per day. She has never used smokeless tobacco. She reports current drug use. Drug: Heroin. She reports that she does not drink alcohol.  Additional Social History:  Alcohol / Drug Use Pain Medications: see MAR Prescriptions: see MAR Over the Counter: see MAR History of alcohol / drug use?: Yes Substance #1 Name of Substance 1: Heroin 1 - Age of First Use: 26 1 - Amount (size/oz): $20-$40  worth 1 - Frequency: daily 1 - Duration: 3 months 1 - Last Use / Amount: this date, unknown amount  CIWA: CIWA-Ar BP: (!) 113/59 Pulse Rate: (!) 107 COWS:    Allergies:  Allergies  Allergen Reactions  . Gluten Other (See Comments)    RECTAL BLEEDING  . Wheat Other (See Comments)    Rectal bleeding    Home Medications: (Not in a hospital admission)   OB/GYN Status:  Patient's last menstrual period was  06/23/2019.  General Assessment Data Location of Assessment: [facility] TTS Assessment: In system Is this a Tele or Face-to-Face Assessment?: Tele Assessment Is this an Initial Assessment or a Re-assessment for this encounter?: Initial Assessment Patient Accompanied by:: N/A Language Other than English: No Living Arrangements: (with mother) What gender do you identify as?: Female Marital status: Single Maiden name: Kingsberry Pregnancy Status: No Living Arrangements: Parent Can pt return to current living arrangement?: Yes Admission Status: Voluntary Is patient capable of signing voluntary admission?: Yes Referral Source: Self/Family/Friend Insurance type: Medicaid     Crisis Care Plan Living Arrangements: Parent Legal Guardian: (self) Name of Psychiatrist: none Name of Therapist: none  Education Status Is patient currently in school?: No Is the patient employed, unemployed or receiving disability?: Unemployed  Risk to self with the past 6 months Suicidal Ideation: Yes-Currently Present Has patient been a risk to self within the past 6 months prior to admission? : Yes Suicidal Intent: No Has patient had any suicidal intent within the past 6 months prior to admission? : No Is patient at risk for suicide?: Yes Suicidal Plan?: No Has patient had any suicidal plan within the past 6 months prior to admission? : No Access to Means: Yes Specify Access to Suicidal Means: uses heroin What has been your use of drugs/alcohol within the last 12 months?: daily heroin use Previous Attempts/Gestures: No How many times?: 0 Other Self Harm Risks: drug use Triggers for Past Attempts: None known Intentional Self Injurious Behavior: None Family Suicide History: Yes(sister) Recent stressful life event(s): Legal Issues, Loss (Comment), Recent negative physical changes, Conflict (Comment)(4 children in DSS custody, severe depression) Persecutory voices/beliefs?: No Depression: Yes Depression  Symptoms: Despondent, Insomnia, Tearfulness, Isolating, Fatigue, Guilt, Loss of interest in usual pleasures, Feeling worthless/self pity, Feeling angry/irritable Substance abuse history and/or treatment for substance abuse?: No Suicide prevention information given to non-admitted patients: Not applicable  Risk to Others within the past 6 months Homicidal Ideation: No Does patient have any lifetime risk of violence toward others beyond the six months prior to admission? : No Thoughts of Harm to Others: No Current Homicidal Intent: No Current Homicidal Plan: No Access to Homicidal Means: No Identified Victim: none History of harm to others?: No Assessment of Violence: None Noted Violent Behavior Description: none Does patient have access to weapons?: No Criminal Charges Pending?: No Does patient have a court date: No Is patient on probation?: No  Psychosis Hallucinations: None noted Delusions: None noted  Mental Status Report Appearance/Hygiene: In scrubs Eye Contact: Good Motor Activity: Freedom of movement Speech: Logical/coherent Level of Consciousness: Crying Mood: Depressed Affect: Depressed Anxiety Level: Moderate Thought Processes: Coherent, Relevant Judgement: Impaired Orientation: Person, Place, Time, Situation Obsessive Compulsive Thoughts/Behaviors: None  Cognitive Functioning Concentration: Normal Memory: Remote Intact, Recent Impaired Is patient IDD: No Insight: Fair Impulse Control: Fair Appetite: Poor Have you had any weight changes? : No Change Sleep: Decreased Total Hours of Sleep: 1 Vegetative Symptoms: None  ADLScreening [facility] Assessment Services) Patient's cognitive ability adequate to safely  complete daily activities?: No Patient able to express need for assistance with ADLs?: Yes Independently performs ADLs?: Yes (appropriate for developmental age)  Prior Inpatient Therapy Prior Inpatient Therapy: No  Prior Outpatient Therapy Prior  Outpatient Therapy: Yes Prior Therapy Dates: as a teen Prior Therapy Facilty/Provider(s): [facility] Reason for Treatment: grief Does patient have an ACCT team?: No Does patient have Intensive In-House Services?  : No Does patient have [facility] services? : No Does patient have P4CC services?: No  ADL Screening (condition at time of admission) Patient's cognitive ability adequate to safely complete daily activities?: No Is the patient deaf or have difficulty hearing?: No Does the patient have difficulty seeing, even when wearing glasses/contacts?: No Does the patient have difficulty concentrating, remembering, or making decisions?: No Patient able to express need for assistance with ADLs?: Yes Does the patient have difficulty dressing or bathing?: No Independently performs ADLs?: Yes (appropriate for developmental age) Does the patient have difficulty walking or climbing stairs?: No Weakness of Legs: None Weakness of Arms/Hands: None  Home Assistive Devices/Equipment Home Assistive Devices/Equipment: None  Therapy Consults (therapy consults require a physician order) PT Evaluation Needed: No OT Evalulation Needed: No SLP Evaluation Needed: No Abuse/Neglect Assessment (Assessment to be complete while patient is alone) Physical Abuse: Denies Verbal Abuse: Denies Sexual Abuse: Denies Exploitation of patient/patient's resources: Denies Self-Neglect: Denies Values / Beliefs Cultural Requests During Hospitalization: None Spiritual Requests During Hospitalization: None Consults Spiritual Care Consult Needed: No Transition of Care Team Consult Needed: No Advance Directives (For Healthcare) Does Patient Have a Medical Advance Directive?: No Would patient like information on creating a medical advance directive?: No - Patient declined          Disposition:  Malachy Chamber, PMHNP recommends in patient treatment. She states patient may take Suboxone at [facility] if she has an active  prescription. Per [place] at [facility], patient accepted to 301-1 after 1630 and pending a negative covid test. [facility] ED notified. Disposition Initial Assessment Completed for this Encounter: Yes  This service was provided via telemedicine using a 2-way, interactive audio and video technology.  Names of all persons participating in this telemedicine service and their role in this encounter. Name: Brittney Nicholson Role: patient  Name: Celedonio Miyamoto, LCSW Role: TTS  Name:  Role:   Name:  Role:     Celedonio Miyamoto 07/09/2019 3:40 PM

## 2019-07-09 NOTE — Progress Notes (Signed)
Pt accepted to [facility] Adult Unit; 301-01.      Particia Lather a NP is the accepting provider.    Dr. Jola Babinski is the attending provider.    Call report to [phone].     [place] @ [facility] notified.     Pt is Voluntary.    Pt may be transported by [employer], [employer].   Pt scheduled to arrive at [facility] after 4:30pm, PENDING negative COVID screen. Please give results to RN when you call report.   Drucilla Schmidt, MSW, LCSW-A Clinical Disposition Social Worker [employer] [facility]/TTS [phone]
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
## 2019-07-09 NOTE — ED Notes (Signed)
Pt was informed that we need a urine sample. 

## 2019-07-10 ENCOUNTER — Other Ambulatory Visit: Payer: Self-pay

## 2019-07-10 DIAGNOSIS — F1120 Opioid dependence, uncomplicated: Secondary | ICD-10-CM | POA: Diagnosis present

## 2019-07-10 LAB — LIPID PANEL
Cholesterol: 160 mg/dL (ref 0–200)
HDL: 46 mg/dL (ref >40)
LDL Cholesterol: 89 mg/dL (ref 0–99)
Total CHOL/HDL Ratio: 3.5 RATIO
Triglycerides: 125 mg/dL (ref <150)
VLDL: 25 mg/dL (ref 0–40)

## 2019-07-10 LAB — TSH: TSH: 0.948 u[IU]/mL (ref 0.350–4.500)

## 2019-07-10 MED ORDER — PANTOPRAZOLE SODIUM 40 MG PO TBEC
40.0000 mg | DELAYED_RELEASE_TABLET | Freq: Every day | ORAL | Status: DC
  Administered 2019-07-10 – 2019-07-12 (×3): 40 mg via ORAL
  Filled 2019-07-10 (×6): qty 1

## 2019-07-10 MED ORDER — POTASSIUM CHLORIDE CRYS ER 20 MEQ PO TBCR
20.0000 meq | EXTENDED_RELEASE_TABLET | Freq: Once | ORAL | Status: AC
  Administered 2019-07-10: 09:00:00 20 meq via ORAL
  Filled 2019-07-10 (×2): qty 1

## 2019-07-10 MED ORDER — VALACYCLOVIR HCL 500 MG PO TABS
500.0000 mg | ORAL_TABLET | Freq: Two times a day (BID) | ORAL | Status: DC
  Administered 2019-07-10 – 2019-07-12 (×5): 500 mg via ORAL
  Filled 2019-07-10 (×10): qty 1

## 2019-07-10 MED ORDER — SULFAMETHOXAZOLE-TRIMETHOPRIM 800-160 MG PO TABS
1.0000 | ORAL_TABLET | Freq: Two times a day (BID) | ORAL | Status: DC
  Administered 2019-07-10 – 2019-07-12 (×4): 1 via ORAL
  Filled 2019-07-10 (×9): qty 1

## 2019-07-10 MED ORDER — TRAZODONE HCL 50 MG PO TABS
50.0000 mg | ORAL_TABLET | Freq: Every evening | ORAL | Status: DC | PRN
  Administered 2019-07-10 – 2019-07-11 (×3): 50 mg via ORAL
  Filled 2019-07-10 (×3): qty 1

## 2019-07-10 MED ORDER — CITALOPRAM HYDROBROMIDE 10 MG PO TABS
10.0000 mg | ORAL_TABLET | Freq: Every day | ORAL | Status: DC
  Administered 2019-07-10 – 2019-07-12 (×3): 10 mg via ORAL
  Filled 2019-07-10 (×6): qty 1
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
## 2019-07-10 NOTE — [facility] Group Notes (Signed)
Adult Psychoeducational Group Note  Date:  07/10/2019 Time:  3:36 PM  Group Topic/Focus:  Making Healthy Choices:   The focus of this group is to help patients identify negative/unhealthy choices they were using prior to admission and identify positive/healthier coping strategies to replace them upon discharge.  Participation Level:  Did Not Attend due to withdrawals  Brittney Nicholson 07/10/2019, 3:36 PM

## 2019-07-10 NOTE — Progress Notes (Signed)
Psychoeducational Group Note  Date:  07/10/2019 Time: 2030  Group Topic/Focus:  wrap up group  Participation Level: Did Not Attend  Participation Quality:  Not Applicable  Affect:  Not Applicable  Cognitive:  Not Applicable  Insight:  Not Applicable  Engagement in Group: Not Applicable  Additional Comments:  Pt was notified that group was beginning but remained in bed.   Marcille Buffy 07/10/2019, 9:34 PM
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
## 2019-07-10 NOTE — Progress Notes (Signed)
   07/10/19 2042  Psych Admission Type (Psych Patients Only)  Admission Status Voluntary  Psychosocial Assessment  Patient Complaints Anxiety;Depression;Insomnia;Substance abuse  Eye Contact Brief  Facial Expression Sad;Flat  Affect Sad;Flat;Depressed  Speech Logical/coherent  Interaction Assertive  Motor Activity Fidgety  Appearance/Hygiene In scrubs (pt in bed)  Behavior Characteristics Cooperative;Anxious  Mood Anxious  Thought Process  Coherency WDL  Content WDL  Delusions WDL  Perception WDL  Hallucination None reported or observed  Judgment WDL  Confusion None  Danger to Self  Current suicidal ideation? Denies  Danger to Others  Danger to Others None reported or observed   Pt in bed with diarrhea and generalized body pains (8/10). Pt denies SI, HI and AVH. Pt endorses inability to fall asleep due to achiness.

## 2019-07-10 NOTE — [facility] Counselor (Signed)
Clinical Social Work Note  CSW attempted to meet with patient to do Psychosocial Assessment.  She stated she is "too sick" and cannot do it at this time.  CSW to continue efforts later in the day.  Ambrose Mantle, LCSW 07/10/2019, 10:06 AM

## 2019-07-10 NOTE — Progress Notes (Signed)
D. Pt remained in bed for much of the day with complaints of moderate withdrawal symptoms ( aches, sweats, anxiety) Pt did get up to eat lunch, and walked down to the dining hall. Pt currently denies SI/HI and AVH A. Labs and vitals monitored. Pt compliant with medications. Pt supported emotionally and encouraged to express concerns and ask questions.   R. Pt remains safe with 15 minute checks. Will continue POC.

## 2019-07-10 NOTE — [facility] Group Notes (Signed)
Adult Psychoeducational Group Note  Date:  07/10/2019 Time:  11:45 AM  Group Topic/Focus:  Setting Goals  Participation Level:  Did Not Attend  Participation Quality:    Affect:    Cognitive:    Insight:   Engagement in Group:    Modes of Intervention:    Additional Comments:  Pt stated that she was feeling too ill to attend the group today.  Brittney Nicholson A 07/10/2019, 11:45 AM

## 2019-07-10 NOTE — Tx Team (Signed)
Initial Treatment Plan 07/10/2019 12:16 AM Brittney Nicholson BDH:[id number]    PATIENT STRESSORS: Financial difficulties Legal issue Loss of sister two years ago from suicide Substance abuse   PATIENT STRENGTHS: Capable of independent living Psychologist, counselling means Physical Health Supportive family/friends Work skills   PATIENT IDENTIFIED PROBLEMS:   " I have been crying and feeling lonely"   "I've overdosed over 10 times in the past 2 years"   Snorts Heroin   Hx of Depression   Sister completed suicide 2 years ago           DISCHARGE CRITERIA:  Improved stabilization in mood, thinking, and/or behavior Reduction of life-threatening or endangering symptoms to within safe limits Verbal commitment to aftercare and medication compliance  PRELIMINARY DISCHARGE PLAN: Outpatient therapy Return to previous living arrangement  PATIENT/FAMILY INVOLVEMENT: This treatment plan has been presented to and reviewed with the patient, Brittney Nicholson, and/or family member, .  The patient and family have been given the opportunity to ask questions and make suggestions.  Andres Ege, RN 07/10/2019, 12:16 AM
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
## 2019-07-10 NOTE — BHH Group Notes (Signed)
LCSW Group Therapy Note  07/10/2019   10:00-11:00am   Type of Therapy and Topic:  Group Therapy: Anger Cues and Responses  Participation Level:  Did Not Attend   Description of Group:   In this group, patients learned how to recognize the physical, cognitive, emotional, and behavioral responses they have to anger-provoking situations.  They identified a recent time they became angry and how they reacted.  They analyzed how their reaction was possibly beneficial and how it was possibly unhelpful.  The group discussed a variety of healthier coping skills that could help with such a situation in the future.  Focus was placed on how helpful it is to recognize the underlying emotions to our anger, because working on those can lead to a more permanent solution as well as our ability to focus on the important rather than the urgent.  Therapeutic Goals: 1. Patients will remember their last incident of anger and how they felt emotionally and physically, what their thoughts were at the time, and how they behaved. 2. Patients will identify how their behavior at that time worked for them, as well as how it worked against them. 3. Patients will explore possible new behaviors to use in future anger situations. 4. Patients will learn that anger itself is normal and cannot be eliminated, and that healthier reactions can assist with resolving conflict rather than worsening situations.  Summary of Patient Progress:  The patient did not attend. Therapeutic Modalities:   Cognitive Behavioral Therapy  Tylon Kemmerling D Keiri Solano    

## 2019-07-11 DIAGNOSIS — F332 Major depressive disorder, recurrent severe without psychotic features: Principal | ICD-10-CM

## 2019-07-11 MED ORDER — LORAZEPAM 2 MG/ML IJ SOLN: 1.0000 mg | Freq: Three times a day (TID) | INTRAMUSCULAR | Status: DC | PRN

## 2019-07-11 MED ORDER — LORAZEPAM 1 MG PO TABS
ORAL_TABLET | ORAL | Status: AC
  Administered 2019-07-11: 16:00:00 1 mg via ORAL
  Filled 2019-07-11: qty 1

## 2019-07-11 MED ORDER — LORAZEPAM 1 MG PO TABS
1.0000 mg | ORAL_TABLET | Freq: Three times a day (TID) | ORAL | Status: DC | PRN
  Filled 2019-07-11: qty 1

## 2019-07-11 NOTE — [facility] Group Notes (Signed)
[facility] LCSW Group Therapy Note  Date/Time:  07/11/2019 9:00-10:00 or 10:00-11:00AM  Type of Therapy and Topic:  Group Therapy:  Healthy and Unhealthy Supports  Participation Level:  Did Not Attend   Description of Group:  Patients in this group were introduced to the idea of adding a variety of healthy supports to address the various needs in their lives.Patients discussed what additional healthy supports could be helpful in their recovery and wellness after discharge in order to prevent future hospitalizations.   An emphasis was placed on using counselor, doctor, therapy groups, 12-step groups, and problem-specific support groups to expand supports.  They also worked as a group on developing a specific plan for several patients to deal with unhealthy supports through boundary-setting, psychoeducation with loved ones, and even termination of relationships.   Therapeutic Goals:   1)  discuss importance of adding supports to stay well once out of the hospital  2)  compare healthy versus unhealthy supports and identify some examples of each  3)  generate ideas and descriptions of healthy supports that can be added  4)  offer mutual support about how to address unhealthy supports  5)  encourage active participation in and adherence to discharge plan    Summary of Patient Progress:  The patient did not attend.   Therapeutic Modalities:   Motivational Interviewing Brief Solution-Focused Therapy  Evorn Gong

## 2019-07-11 NOTE — Progress Notes (Signed)
   07/11/19 2020  Psych Admission Type (Psych Patients Only)  Admission Status Voluntary  Psychosocial Assessment  Patient Complaints Anxiety (panicky and feeling trapped and closed in)  Eye Contact Fair  Facial Expression Anxious  Affect Anxious  Speech Logical/coherent  Interaction Assertive  Motor Activity Fidgety  Appearance/Hygiene Unremarkable  Behavior Characteristics Cooperative;Anxious  Mood Anxious  Thought Process  Coherency WDL  Content WDL  Delusions WDL  Perception WDL  Hallucination None reported or observed  Judgment WDL  Confusion None  Danger to Self  Current suicidal ideation? Denies  Danger to Others  Danger to Others None reported or observed   Pt denies SI, HI, AVH and pain. Says anxiety is 10/10. Pt feels panicky, trapped, like she can't breathe. Pt hasn't been receiving clonidine d/t chronically low blood pressure and dizziness with changing positions too quickly. Pt says her blood pressure has always been low especially with pregnancies. Pt says it's not a part of withdrawals that she feels so panicked.

## 2019-07-11 NOTE — Progress Notes (Signed)
Patient up at nurse's station demanding to leave. This is the 2nd time that she has approached staff and requested "to speak to a doctor immediately."  Patient was seen by NP earlier. Patient became loud and started to yell. I attempted to deescalate the patient verbally, which was somewhat effective. Called Dr. Jola Babinski regarding patient's behavior and he gave order for 1 mg ativan TID. Gave patient 1st dose. She is now calm, talkative, and relaxed. Patient appears receptive to staff. She apologized for her behavior earlier. She has been encouraged to attend group; however, she states, "I can't be around those people."

## 2019-07-11 NOTE — Progress Notes (Signed)
Psychoeducational Group Note  Date:  07/11/2019 Time:  2030  Group Topic/Focus:  wrap up group  Participation Level: Did Not Attend  Participation Quality:  Not Applicable  Affect:  Not Applicable  Cognitive:  Not Applicable  Insight:  Not Applicable  Engagement in Group: Not Applicable  Additional Comments:  Pt was notified that group was beginning but remained in bed.   Marcille Buffy 07/11/2019, 10:58 PM

## 2019-07-11 NOTE — [facility] Group Notes (Signed)
Adult Psychoeducational Group Note  Date:  07/11/2019 Time:  9:50 AM  Group Topic/Focus:  Progressive Relaxation  Participation Level:  Did Not Attend   Dione Housekeeper 07/11/2019, 9:50 AM
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
## 2019-07-11 NOTE — [facility] Group Notes (Signed)
Adult Psychoeducational Group Note  Date:  07/11/2019 Time:  3:06 PM  Group Topic/Focus:  Life Skills  Participation Level:  Did Not Attend   Dione Housekeeper
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
## 2019-07-11 NOTE — Progress Notes (Signed)
[facility] MD Progress Note  07/11/2019 11:31 AM FUMIKO CHAM  MRN:  [id number]  Subjective: Cheryllynn reports, "I'm not doing good laying here. I want to be discharged today. I want to go home so that I can take my Suboxen. It is sitting at the pharmacy right now. You guys are not helping me. I laying here hurting all over. I do not want to attend any groups here, I think your group session here is stupid. I don't think that you guys can help me".  Objective: This is the first psychiatric admission/assessment in this [facility] for this 29 year old Caucasian mother of three & a chronic heroin & fentanyl abuser. She is being admitted to the [facility] from the [facility] with complaints of apparent heroin/fentanyl overdose. Chart review indicated that upon arrival to the [facility] ED that patient presented as a poor historian as she did not know what or how she ended up at the hospital. After evaluation, she was sent to the [facility] for further mental health evaluation & treatments. Vaneta is seen, chart reviewed. The chart findings discussed with the treatment. She is lying down in bed. She presents angry, irritated, frustrated & pessimistic about her treatment & recovery. She says at this point in her life, no one can help her. She thinks that the treatment she is currently getting will do her no good on the long run. She blames her drug addiction on the self-inflicted death of sister Anderson Malta, whom she stated hung herself 2 years ago. She blamed that her mother does not care about her or what happen to her in the near future. She also blamed the attending psychiatrist whom she states was focused on asking her if she has STD than the reason she was here in the first place. Patient remains pessimistic & ruminative about her drug addiction that she has difficult time heeding to redirections. She thinks counseling is useless because her previous counselors did not know what they were doing or how to counsel those who are  grieving. She says she has given up on sobriety & about being sober. Does not think that her 3 children will ever miss her because they do not know who she is after all. She has asked to be discharged today to go home & pick her Suboxen from the drug store & start on it. She does however, deny any SIHI, AVH, delusional thoughts or paranoia. She is complaining of her whole body hurting but denies any nausea, vomiting or stomach upset. She denies any SIHI, AVH, delusional thoughts or paranoia. Has not been out of her room since admission & does not want to come out of her room or attend group sessions.  Principal Problem: Opioid use disorder, severe, dependence ([place])  Diagnosis: Principal Problem:   Opioid use disorder, severe, dependence ([place]) Active Problems:   MDD (major depressive disorder), recurrent episode, severe ([place])  Total Time spent with patient: 25 minutes  Past Psychiatric History: See H&P.  Past Medical History:  Past Medical History:  Diagnosis Date  . Anxiety   . Depression   . 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 Headache    with this pregnancy  . Heroin addiction ([place])   . HSV-2 infection complicating pregnancy   . Opiate use   . Pregnant 12/13/2014  . Syncope   . Warts     Past Surgical History:  Procedure Laterality Date  . BREAST LUMPECTOMY  2006  . CHOLECYSTECTOMY N/A 01/25/2013   Procedure: LAPAROSCOPIC  CHOLECYSTECTOMY;  Surgeon: Dalia HeadingMark A Jenkins, MD;  Location: [facility] ORS;  Service: General;  Laterality: N/A;  . ESOPHAGOGASTRODUODENOSCOPY (EGD) WITH PROPOFOL N/A 07/04/2019   Procedure: ESOPHAGOGASTRODUODENOSCOPY (EGD) WITH PROPOFOL;  Surgeon: Malissa Hippoehman, Najeeb U, MD;  Location: [facility] ENDO SUITE;  Service: Endoscopy;  Laterality: N/A;  . right arm Right    has pins.  . TONSILLECTOMY    . TONSILLECTOMY  2011   Family History:  Family History  Problem Relation Age of Onset  . Cancer Mother        uterine  . Diabetes Father   . Kidney failure Father    Family Psychiatric   History: See H&P.  Social History:  Social History   Substance and Sexual Activity  Alcohol Use No     Social History   Substance and Sexual Activity  Drug Use Yes  . Types: Heroin   Comment: Heroin Addict    Social History   Socioeconomic History  . Marital status: Single    Spouse name: Not on file  . Number of children: 2  . Years of education: Not on file  . Highest education level: Not on file  Occupational History  . Not on file  Tobacco Use  . Smoking status: Current Every Day Smoker    Packs/day: 1.00    Types: Cigarettes  . Smokeless tobacco: Never Used  Substance and Sexual Activity  . Alcohol use: No  . Drug use: Yes    Types: Heroin    Comment: Heroin Addict  . Sexual activity: Yes    Birth control/protection: None  Other Topics Concern  . Not on file  Social History Narrative   ** Merged History Encounter **       Social Determinants of Health   Financial Resource Strain:   . Difficulty of Paying Living Expenses: Not on file  Food Insecurity:   . Worried About Programme researcher, broadcasting/film/videounning Out of Food in the Last Year: Not on file  . Ran Out of Food in the Last Year: Not on file  Transportation Needs:   . Lack of Transportation (Medical): Not on file  . Lack of Transportation (Non-Medical): Not on file  Physical Activity:   . Days of Exercise per Week: Not on file  . Minutes of Exercise per Session: Not on file  Stress:   . Feeling of Stress : Not on file  Social Connections:   . Frequency of Communication with Friends and Family: Not on file  . Frequency of Social Gatherings with Friends and Family: Not on file  . Attends Religious Services: Not on file  . Active Member of Clubs or Organizations: Not on file  . Attends BankerClub or Organization Meetings: Not on file  . Marital Status: Not on file   Additional Social History:   Sleep: Good  Appetite:  Good  Current Medications: Current Facility-Administered Medications  Medication Dose Route Frequency  Provider Last Rate Last Admin  . acetaminophen (TYLENOL) tablet 650 mg  650 mg Oral Q6H PRN Maryagnes AmosStarkes-Perry, Takia S, FNP   650 mg at 07/09/19 2317  . alum & mag hydroxide-simeth (MAALOX/MYLANTA) 200-200-20 MG/5ML suspension 30 mL  30 mL Oral Q4H PRN Rosario AdieStarkes-Perry, Juel Burrowakia S, FNP      . citalopram (CELEXA) tablet 10 mg  10 mg Oral Daily Antonieta Pertlary, Greg Lawson, MD   10 mg at 07/11/19 0900  . cloNIDine (CATAPRES) tablet 0.1 mg  0.1 mg Oral QID Nira ConnBerry, Jason A, NP   0.1 mg at 07/11/19 0900  Followed by  . [START ON 07/12/2019] cloNIDine (CATAPRES) tablet 0.1 mg  0.1 mg Oral BH-qamhs Jackelyn Poling, NP       Followed by  . [START ON 07/15/2019] cloNIDine (CATAPRES) tablet 0.1 mg  0.1 mg Oral QAC breakfast Nira Conn A, NP      . dicyclomine (BENTYL) tablet 20 mg  20 mg Oral Q6H PRN Nira Conn A, NP      . hydrOXYzine (ATARAX/VISTARIL) tablet 25 mg  25 mg Oral TID PRN Maryagnes Amos, FNP   25 mg at 07/11/19 0933  . loperamide (IMODIUM) capsule 2-4 mg  2-4 mg Oral PRN Nira Conn A, NP   4 mg at 07/10/19 2130  . magnesium hydroxide (MILK OF MAGNESIA) suspension 30 mL  30 mL Oral Daily PRN Starkes-Perry, Juel Burrow, FNP      . methocarbamol (ROBAXIN) tablet 500 mg  500 mg Oral Q8H PRN Nira Conn A, NP   500 mg at 07/11/19 0933  . naproxen (NAPROSYN) tablet 500 mg  500 mg Oral BID PRN Nira Conn A, NP   500 mg at 07/11/19 0933  . ondansetron (ZOFRAN-ODT) disintegrating tablet 4 mg  4 mg Oral Q6H PRN Nira Conn A, NP   4 mg at 07/11/19 0933  . pantoprazole (PROTONIX) EC tablet 40 mg  40 mg Oral Daily Antonieta Pert, MD   40 mg at 07/11/19 0900  . sulfamethoxazole-trimethoprim (BACTRIM DS) 800-160 MG per tablet 1 tablet  1 tablet Oral Q12H Antonieta Pert, MD   1 tablet at 07/11/19 0900  . traZODone (DESYREL) tablet 50 mg  50 mg Oral QHS PRN Nira Conn A, NP   50 mg at 07/10/19 2130  . valACYclovir (VALTREX) tablet 500 mg  500 mg Oral BID Antonieta Pert, MD   500 mg at 07/11/19 0900   Lab  Results:  Results for orders placed or performed during the hospital encounter of 07/09/19 (from the past 48 hour(s))  Lipid panel     Status: None   Collection Time: 07/10/19  6:30 AM  Result Value Ref Range   Cholesterol 160 0 - 200 mg/dL   Triglycerides 751 <700 mg/dL   HDL 46 >17 mg/dL   Total CHOL/HDL Ratio 3.5 RATIO   VLDL 25 0 - 40 mg/dL   LDL Cholesterol 89 0 - 99 mg/dL    Comment:        Total Cholesterol/HDL:CHD Risk Coronary Heart Disease Risk Table                     Men   Women  1/2 Average Risk   3.4   3.3  Average Risk       5.0   4.4  2 X Average Risk   9.6   7.1  3 X Average Risk  23.4   11.0        Use the calculated Patient Ratio above and the CHD Risk Table to determine the patient's CHD Risk.        ATP III CLASSIFICATION (LDL):  <100     mg/dL   Optimal  494-496  mg/dL   Near or Above                    Optimal  130-159  mg/dL   Borderline  759-163  mg/dL   High  >846     mg/dL   Very High Performed at [facility], [address]  [place] [place], [place] [address]   TSH     Status: None   Collection Time: 07/10/19  6:30 AM  Result Value Ref Range   TSH 0.948 0.350 - 4.500 uIU/mL    Comment: Performed by a 3rd Generation assay with a functional sensitivity of <=0.01 uIU/mL. Performed at [facility], [address] [place], [place] [address]    Blood Alcohol level:  Lab Results  Component Value Date   ETH <10 07/03/2019   ETH <10 07/01/2019   Metabolic Disorder Labs: Lab Results  Component Value Date   HGBA1C 4.9 04/11/2019   MPG 93.93 04/11/2019   No results found for: PROLACTIN Lab Results  Component Value Date   CHOL 160 07/10/2019   TRIG 125 07/10/2019   HDL 46 07/10/2019   CHOLHDL 3.5 07/10/2019   VLDL 25 07/10/2019   LDLCALC 89 07/10/2019   Physical Findings: AIMS: Facial and Oral Movements Muscles of Facial Expression: None, normal Lips and Perioral Area: None, normal Jaw: None,  normal Tongue: None, normal,Extremity Movements Upper (arms, wrists, hands, fingers): None, normal Lower (legs, knees, ankles, toes): None, normal, Trunk Movements Neck, shoulders, hips: None, normal, Overall Severity Severity of abnormal movements (highest score from questions above): None, normal Incapacitation due to abnormal movements: None, normal Patient's awareness of abnormal movements (rate only patient's report): No Awareness, Dental Status Current problems with teeth and/or dentures?: No Does patient usually wear dentures?: No  CIWA:    COWS:  COWS Total Score: 8  Musculoskeletal: Strength & Muscle Tone: within normal limits Gait & Station: normal Patient leans: N/A  Psychiatric Specialty Exam: Physical Exam  Nursing note and vitals reviewed. Constitutional: She is oriented to person, place, and time. She appears well-developed.  Respiratory: Effort normal.  Genitourinary:    Genitourinary Comments: Deferred   Musculoskeletal:        General: Normal range of motion.     Cervical back: Normal range of motion.  Neurological: She is alert and oriented to person, place, and time.  Skin: Skin is warm and dry.    Review of Systems  Constitutional: Negative for chills, diaphoresis and fever.  HENT: Negative for congestion, rhinorrhea, sneezing and sore throat.   Respiratory: Negative for cough, shortness of breath and wheezing.   Cardiovascular: Negative for chest pain and palpitations.  Gastrointestinal: Negative for diarrhea, nausea and vomiting.  Genitourinary: Negative for difficulty urinating.  Musculoskeletal: Positive for arthralgias and myalgias.  Skin: Negative for color change.  Allergic/Immunologic: Negative for environmental allergies and food allergies.  Neurological: Negative for dizziness and headaches.  Psychiatric/Behavioral: Positive for decreased concentration and dysphoric mood. Negative for agitation, behavioral problems, confusion, hallucinations,  self-injury, sleep disturbance and suicidal ideas. The patient is nervous/anxious. The patient is not hyperactive.     Blood pressure (!) 94/57, pulse 72, temperature 98.7 F (37.1 C), temperature source Oral, resp. rate 18, height 5\' 7"  (1.702 m), weight 88.5 kg, last menstrual period 06/23/2019, SpO2 100 %, not currently breastfeeding.Body mass index is 30.54 kg/m.  General Appearance: Casual  Eye Contact:  Fair  Speech:  Clear and Coherent and Normal Rate  Volume:  Normal  Mood:  Irritable and angry  Affect:  Tearful and Irritated  Thought Process:  Coherent and Descriptions of Associations: Intact  Orientation:  Full (Time, Place, and Person)  Thought Content:  Rumination  Suicidal Thoughts:  Denies any thoughts, plans or intent  Homicidal Thoughts:  Denies  Memory:  Immediate;   Good Recent;   Good  Remote;   Good  Judgement:  Fair  Insight:  Lacking  Psychomotor Activity:  Normal  Concentration:  Concentration: Fair and Attention Span: Fair  Recall:  Good  Fund of Knowledge:  Fair  Language:  Good  Akathisia:  NA  Handed:  Right  AIMS (if indicated):     Assets:  Communication Skills Physical Health  ADL's:  Intact  Cognition:  WNL  Sleep:  Number of Hours: 6.25   Treatment Plan Summary: Daily contact with patient to assess and evaluate symptoms and progress in treatment and Medication management.  -Continue inpatient hospitalization.  -Will continue today 07/11/2019 plan as below except where it is noted.  -Opioid detox.              -Continue the COWS detoxification treatment protocols as already in progress.  -Depression   -Continue Citalopram 10mg  po qd.  -Anxiety  -Continue atarax 25 mg po q8h prn anxiety  -Insomnia  -Continue Trazodone 50 mg po qhs prn  -Other medical issues    -Continue Bactrim 800-160 mg po bid infection.             -Continue  Valtrex 500 mg po bid for herpes simplex infection.             -Continue Protonix 40 mg po Q an for  GERD.  -Encourage participation in groups and therapeutic milieu  -Disposition planning will be ongoing  , NP, PMHNP, FNP-BC 07/11/2019, 11:31 AM

## 2019-07-12 MED ORDER — SULFAMETHOXAZOLE-TRIMETHOPRIM 800-160 MG PO TABS: 1.0000 | ORAL_TABLET | Freq: Two times a day (BID) | ORAL | 0 refills | Status: AC

## 2019-07-12 MED ORDER — CITALOPRAM HYDROBROMIDE 10 MG PO TABS: 10.0000 mg | ORAL_TABLET | Freq: Every day | ORAL | 0 refills | Status: AC

## 2019-07-12 MED ORDER — PANTOPRAZOLE SODIUM 40 MG PO TBEC: 40.0000 mg | DELAYED_RELEASE_TABLET | Freq: Every day | ORAL | 0 refills | Status: AC

## 2019-07-12 MED ORDER — HYDROXYZINE HCL 25 MG PO TABS: 25.0000 mg | ORAL_TABLET | Freq: Three times a day (TID) | ORAL | 0 refills | Status: AC | PRN

## 2019-07-12 MED ORDER — TRAZODONE HCL 50 MG PO TABS: 50.0000 mg | ORAL_TABLET | Freq: Every evening | ORAL | 0 refills | Status: AC | PRN

## 2019-07-12 NOTE — Progress Notes (Signed)
Discharge Note:  Patient was discharged and police picked up patient and took to jail.  Patient has several warrants against her.  Discharge information given.  Patient stated she received all her belongings, etc.

## 2019-07-12 NOTE — Progress Notes (Signed)
  [facility] Adult Case Management Discharge Plan :  Will you be returning to the same living situation after discharge:  Yes,  home. At discharge, do you have transportation home?: Yes,  aunt will pick up. Do you have the ability to pay for your medications: Yes,  Medicaid.  Release of information consent forms completed and in the chart.  Patient to Follow up at: Follow-up Information    [facility], [facility]. Go on 07/13/2019.   Why: Your appointments are tomorrow (02/02) at 8:20am. You first need to complete a hospital discharge appointment. Your appointment for medication assisted treatment is scheduled for Thursday at 12pm. You must attend your hospital discharge appointment first Contact information: [address] [place] [address] [phone]           Next level of care provider has access to [facility] Link:no  Safety Planning and Suicide Prevention discussed: Yes,  with patient and mother.  Have you used any form of tobacco in the last 30 days? (Cigarettes, Smokeless Tobacco, Cigars, and/or Pipes): Yes  Has patient been referred to the Quitline?: Patient refused referral  Patient has been referred for addiction treatment: Yes  Darreld Mclean, LCSWA 07/12/2019, 12:17 PM

## 2019-07-12 NOTE — BHH Counselor (Signed)
Patient revoked consents for CSW team to speak with mother and other family supports.  Mother has called the unit several times in an attempt to obtain information regarding patient's treatment. Patient has called her mother and has requested her mother to cease contact.  Enid Cutter, LCSW-A Clinical Social Worker

## 2019-07-12 NOTE — Progress Notes (Signed)
D:  Patient has been out of her room sitting in dayroom or talking on phone most of the morning.  Does not want roommate so she stays out of her room.  Patient has been encouraged repeatedly to drink fluids, eat.  Patient has been talking on phone to her mom several times this morning.  Patient removed her mother's name from the phone list, does not want her mother to have information about her [facility] admission. A:  Medications administered per MD orders.  Emotional support and encouragement given patient.  R:  Patient denied SI and HI while talking to nurse this morning, contracts for safety.  Denied A/V hallucinations.  Safety maintained with 15 minute checks.

## 2019-07-12 NOTE — BHH Suicide Risk Assessment (Signed)
[facility] Discharge Suicide Risk Assessment   Principal Problem: Opioid use disorder, severe, dependence (HCC) Discharge Diagnoses: Principal Problem:   Opioid use disorder, severe, dependence (HCC) Active Problems:   MDD (major depressive disorder), recurrent episode, severe (HCC)   Total Time spent with patient: 45 minutes  Musculoskeletal: Strength & Muscle Tone: within normal limits Gait & Station: normal Patient leans: N/A  Psychiatric Specialty Exam: Review of Systems  All other systems reviewed and are negative.   Blood pressure (!) 86/65, pulse 67, temperature 98 F (36.7 C), temperature source Oral, resp. rate 18, height 5\' 7"  (1.702 m), weight 88.5 kg, last menstrual period 06/23/2019, SpO2 100 %, not currently breastfeeding.Body mass index is 30.54 kg/m.  General Appearance: Casual  Eye Contact::  Good  Speech:  Normal Rate409  Volume:  Normal  Mood:  Irritable  Affect:  Congruent  Thought Process:  Coherent and Descriptions of Associations: Intact  Orientation:  Full (Time, Place, and Person)  Thought Content:  Logical  Suicidal Thoughts:  No  Homicidal Thoughts:  No  Memory:  Immediate;   Good Recent;   Good Remote;   Good  Judgement:  Intact  Insight:  Lacking  Psychomotor Activity:  Normal  Concentration:  Good  Recall:  Good  Fund of Knowledge:Good  Language: Good  Akathisia:  Negative  Handed:  Right  AIMS (if indicated):     Assets:  Desire for Improvement Housing Resilience Social Support  Sleep:  Number of Hours: 4.5  Cognition: WNL  ADL's:  Intact   Mental Status Per Nursing Assessment::   On Admission:  NA  Demographic Factors:  Caucasian, Low socioeconomic status and Unemployed  Loss Factors: NA  Historical Factors: Impulsivity  Risk Reduction Factors:   Living with another person, especially a relative  Continued Clinical Symptoms:  Alcohol/Substance Abuse/Dependencies  Cognitive Features That Contribute To Risk:  None     Suicide Risk:  Minimal: No identifiable suicidal ideation.  Patients presenting with no risk factors but with morbid ruminations; may be classified as minimal risk based on the severity of the depressive symptoms  Follow-up Information    [facility], [facility]. Go on 07/13/2019.   Why: Your appointments are tomorrow (02/02) at 8:20am. You first need to complete a hospital discharge appointment. Your appointment for medication assisted treatment is scheduled for Thursday at 12pm. You must attend your hospital discharge appointment first Contact information: [address] [place] [phone]           Plan Of Care/Follow-up recommendations:  Activity:  ad lib Other:  Follow up with [facility] on 07/13/19  09/10/19, MD 07/12/2019, 12:23 PM

## 2019-07-12 NOTE — Progress Notes (Signed)
Recreation Therapy Notes  Date:  2.1.21 Time: 0930 Location: [address] Dayroom  Group Topic: Stress Management  Goal Area(s) Addresses:  Patient will identify positive stress management techniques. Patient will identify benefits of using stress management post d/c.  Intervention: Worksheet  Activity :  Meditation.  LRT played a meditation that focused on resilience.  Patients were to listen and follow along as meditation played to fully engage in activity.    Education:  Stress Management, Discharge Planning.   Education Outcome: Acknowledges Education  Clinical Observations/Feedback:  Pt did not attend group session.     Garion Wempe Lillia Abed LRT/CTRS         Caroll Rancher A 07/12/2019 12:41 PM

## 2019-07-12 NOTE — BHH Counselor (Signed)
CSW spoke with patient's outpatient provider, Dr.Bowen, in an attempt to coordinate outpatient follow up.  Dr.Bowen will not allow patient to return for care unless patient has completed a 14 day inpatient residential substance use treatment program. Dr.Bowen requested CSW share this information with patient.  Enid Cutter, LCSW-A Clinical Social Worker
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
## 2019-07-12 NOTE — Progress Notes (Addendum)
  Spiritual care group on grief and loss facilitated by chaplain Burnis Kingfisher MDiv, BCC  Group Goal:  Support / Education around grief and loss Members engage in facilitated group support and psycho-social education.  Group Description:  Following introductions and group rules, group members engaged in facilitated group dialog and support around topic of loss, with particular support around experiences of loss in their lives. Group Identified types of loss (relationships / self / things) and identified patterns, circumstances, and changes that precipitate losses. Reflected on thoughts / feelings around loss, normalized grief responses, and recognized variety in grief experience.   Group noted Worden's four tasks of grief in discussion.  Group drew on Adlerian / Rogerian, narrative, MI, Patient Progress:   Brittney Nicholson left group after chaplain introduced group as focusing on grief and loss.  Brittney Nicholson was absent throughout group and returned at end of group time.  Brittney Nicholson interjected and was dismissive of treatment at [facility] - stating "nothing has been helping."   As facilitator was wrapping up group and naming topics of grief journey, Brittney Nicholson stated that "grief is not a journey" and noted that things had not changed for her since her sister's death two years prior.  Chaplain placed limits on group time, and noted willingness to support Brittney Nicholson individually around this outside of group.

## 2019-08-02 ENCOUNTER — Emergency Department (HOSPITAL_COMMUNITY): Payer: Medicaid Other

## 2019-08-02 ENCOUNTER — Other Ambulatory Visit: Payer: Self-pay

## 2019-08-02 ENCOUNTER — Encounter (HOSPITAL_COMMUNITY): Payer: Self-pay | Admitting: Emergency Medicine

## 2019-08-02 ENCOUNTER — Emergency Department (HOSPITAL_COMMUNITY)
Admission: EM | Admit: 2019-08-02 | Discharge: 2019-08-02 | Disposition: A | Discharge status: Home / Self Care | Payer: Medicaid Other | Attending: Emergency Medicine | Admitting: Emergency Medicine

## 2019-08-02 DIAGNOSIS — Z5321 Procedure and treatment not carried out due to patient leaving prior to being seen by health care provider: Secondary | ICD-10-CM | POA: Diagnosis not present

## 2019-08-02 DIAGNOSIS — T40411A Poisoning by fentanyl or fentanyl analogs, accidental (unintentional), initial encounter: Secondary | ICD-10-CM | POA: Insufficient documentation
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
## 2019-08-03 ENCOUNTER — Encounter (HOSPITAL_COMMUNITY): Payer: Self-pay | Admitting: Emergency Medicine

## 2019-08-03 ENCOUNTER — Emergency Department (HOSPITAL_COMMUNITY)
Admission: EM | Admit: 2019-08-03 | Discharge: 2019-08-03 | Disposition: A | Discharge status: Home / Self Care | Payer: Medicaid Other | Attending: Emergency Medicine | Admitting: Emergency Medicine

## 2019-08-03 ENCOUNTER — Other Ambulatory Visit: Payer: Self-pay

## 2019-08-03 ENCOUNTER — Emergency Department (HOSPITAL_COMMUNITY)
Admission: EM | Admit: 2019-08-03 | Discharge: 2019-08-03 | Payer: Medicaid Other | Source: Home / Self Care | Attending: Emergency Medicine | Admitting: Emergency Medicine

## 2019-08-03 ENCOUNTER — Encounter (HOSPITAL_COMMUNITY): Payer: Self-pay | Admitting: *Deleted

## 2019-08-03 DIAGNOSIS — T25131A Burn of first degree of right toe(s) (nail), initial encounter: Secondary | ICD-10-CM

## 2019-08-03 DIAGNOSIS — T40601A Poisoning by unspecified narcotics, accidental (unintentional), initial encounter: Secondary | ICD-10-CM | POA: Insufficient documentation

## 2019-08-03 DIAGNOSIS — X16XXXD Contact with hot heating appliances, radiators and pipes, subsequent encounter: Secondary | ICD-10-CM | POA: Insufficient documentation

## 2019-08-03 DIAGNOSIS — Z79899 Other long term (current) drug therapy: Secondary | ICD-10-CM | POA: Insufficient documentation

## 2019-08-03 DIAGNOSIS — F419 Anxiety disorder, unspecified: Secondary | ICD-10-CM | POA: Insufficient documentation

## 2019-08-03 DIAGNOSIS — Y939 Activity, unspecified: Secondary | ICD-10-CM | POA: Diagnosis not present

## 2019-08-03 DIAGNOSIS — R45851 Suicidal ideations: Secondary | ICD-10-CM | POA: Insufficient documentation

## 2019-08-03 DIAGNOSIS — R600 Localized edema: Secondary | ICD-10-CM | POA: Insufficient documentation

## 2019-08-03 DIAGNOSIS — Y999 Unspecified external cause status: Secondary | ICD-10-CM | POA: Insufficient documentation

## 2019-08-03 DIAGNOSIS — F17210 Nicotine dependence, cigarettes, uncomplicated: Secondary | ICD-10-CM | POA: Insufficient documentation

## 2019-08-03 DIAGNOSIS — Y929 Unspecified place or not applicable: Secondary | ICD-10-CM | POA: Insufficient documentation

## 2019-08-03 DIAGNOSIS — X158XXA Contact with other hot household appliances, initial encounter: Secondary | ICD-10-CM | POA: Insufficient documentation

## 2019-08-03 DIAGNOSIS — Z23 Encounter for immunization: Secondary | ICD-10-CM | POA: Insufficient documentation

## 2019-08-03 DIAGNOSIS — F1120 Opioid dependence, uncomplicated: Secondary | ICD-10-CM | POA: Insufficient documentation

## 2019-08-03 DIAGNOSIS — T25231D Burn of second degree of right toe(s) (nail), subsequent encounter: Secondary | ICD-10-CM | POA: Insufficient documentation

## 2019-08-03 DIAGNOSIS — F332 Major depressive disorder, recurrent severe without psychotic features: Secondary | ICD-10-CM | POA: Insufficient documentation

## 2019-08-03 DIAGNOSIS — T25231A Burn of second degree of right toe(s) (nail), initial encounter: Secondary | ICD-10-CM

## 2019-08-03 LAB — COMPREHENSIVE METABOLIC PANEL
ALT: 22 U/L (ref 0–44)
ALT: 22 U/L (ref 0–44)
AST: 25 U/L (ref 15–41)
AST: 23 U/L (ref 15–41)
Albumin: 4.1 g/dL (ref 3.5–5.0)
Albumin: 4.1 g/dL (ref 3.5–5.0)
Alkaline Phosphatase: 58 U/L (ref 38–126)
Alkaline Phosphatase: 55 U/L (ref 38–126)
Anion gap: 9 (ref 5–15)
Anion gap: 8 (ref 5–15)
BUN: 10 mg/dL (ref 6–20)
BUN: 7 mg/dL (ref 6–20)
CO2: 28 mmol/L (ref 22–32)
CO2: 26 mmol/L (ref 22–32)
Calcium: 9 mg/dL (ref 8.9–10.3)
Calcium: 9 mg/dL (ref 8.9–10.3)
Chloride: 101 mmol/L (ref 98–111)
Chloride: 99 mmol/L (ref 98–111)
Creatinine, Ser: 0.74 mg/dL (ref 0.44–1.00)
Creatinine, Ser: 0.75 mg/dL (ref 0.44–1.00)
GFR calc Af Amer: >60 mL/min (ref >60)
GFR calc Af Amer: >60 mL/min (ref >60)
GFR calc non Af Amer: >60 mL/min (ref >60)
GFR calc non Af Amer: >60 mL/min (ref >60)
Glucose, Bld: 119 mg/dL — ABNORMAL HIGH (ref 70–99)
Glucose, Bld: 108 mg/dL — ABNORMAL HIGH (ref 70–99)
Potassium: 3.9 mmol/L (ref 3.5–5.1)
Potassium: 3.8 mmol/L (ref 3.5–5.1)
Sodium: 138 mmol/L (ref 135–145)
Sodium: 133 mmol/L — ABNORMAL LOW (ref 135–145)
Total Bilirubin: 0.4 mg/dL (ref 0.3–1.2)
Total Bilirubin: 0.6 mg/dL (ref 0.3–1.2)
Total Protein: 6.9 g/dL (ref 6.5–8.1)
Total Protein: 6.7 g/dL (ref 6.5–8.1)

## 2019-08-03 LAB — CBC WITH DIFFERENTIAL/PLATELET
Abs Immature Granulocytes: 0.11 10*3/uL — ABNORMAL HIGH (ref 0.00–0.07)
Abs Immature Granulocytes: 0.03 10*3/uL (ref 0.00–0.07)
Basophils Absolute: 0.1 10*3/uL (ref 0.0–0.1)
Basophils Absolute: 0 10*3/uL (ref 0.0–0.1)
Basophils Relative: 0 %
Basophils Relative: 0 %
Eosinophils Absolute: 0.1 10*3/uL (ref 0.0–0.5)
Eosinophils Absolute: 0.2 10*3/uL (ref 0.0–0.5)
Eosinophils Relative: 0 %
Eosinophils Relative: 3 %
HCT: 37.8 % (ref 36.0–46.0)
HCT: 36.8 % (ref 36.0–46.0)
Hemoglobin: 11.7 g/dL — ABNORMAL LOW (ref 12.0–15.0)
Hemoglobin: 11.4 g/dL — ABNORMAL LOW (ref 12.0–15.0)
Immature Granulocytes: 1 %
Immature Granulocytes: 0 %
Lymphocytes Relative: 6 %
Lymphocytes Relative: 22 %
Lymphs Abs: 1.1 10*3/uL (ref 0.7–4.0)
Lymphs Abs: 1.7 10*3/uL (ref 0.7–4.0)
MCH: 27.5 pg (ref 26.0–34.0)
MCH: 27.5 pg (ref 26.0–34.0)
MCHC: 31 g/dL (ref 30.0–36.0)
MCHC: 31 g/dL (ref 30.0–36.0)
MCV: 88.7 fL (ref 80.0–100.0)
MCV: 88.7 fL (ref 80.0–100.0)
Monocytes Absolute: 1.2 10*3/uL — ABNORMAL HIGH (ref 0.1–1.0)
Monocytes Absolute: 0.5 10*3/uL (ref 0.1–1.0)
Monocytes Relative: 7 %
Monocytes Relative: 7 %
Neutro Abs: 15.8 10*3/uL — ABNORMAL HIGH (ref 1.7–7.7)
Neutro Abs: 5.1 10*3/uL (ref 1.7–7.7)
Neutrophils Relative %: 86 %
Neutrophils Relative %: 68 %
Platelets: 242 10*3/uL (ref 150–400)
Platelets: 188 10*3/uL (ref 150–400)
RBC: 4.26 MIL/uL (ref 3.87–5.11)
RBC: 4.15 MIL/uL (ref 3.87–5.11)
RDW: 14.5 % (ref 11.5–15.5)
RDW: 14.5 % (ref 11.5–15.5)
WBC: 18.3 10*3/uL — ABNORMAL HIGH (ref 4.0–10.5)
WBC: 7.5 10*3/uL (ref 4.0–10.5)
nRBC: 0 % (ref 0.0–0.2)
nRBC: 0 % (ref 0.0–0.2)

## 2019-08-03 LAB — SALICYLATE LEVEL: Salicylate Lvl: <7 mg/dL — ABNORMAL LOW (ref 7.0–30.0)

## 2019-08-03 LAB — RAPID URINE DRUG SCREEN, HOSP PERFORMED
Amphetamines: NOT DETECTED
Barbiturates: NOT DETECTED
Benzodiazepines: NOT DETECTED
Cocaine: NOT DETECTED
Opiates: NOT DETECTED
Tetrahydrocannabinol: NOT DETECTED

## 2019-08-03 LAB — ETHANOL
Alcohol, Ethyl (B): <10 mg/dL (ref <10)
Alcohol, Ethyl (B): <10 mg/dL (ref <10)

## 2019-08-03 LAB — HCG, QUANTITATIVE, PREGNANCY: hCG, Beta Chain, Quant, S: <1 m[IU]/mL (ref <5)

## 2019-08-03 LAB — ACETAMINOPHEN LEVEL: Acetaminophen (Tylenol), Serum: <10 ug/mL — ABNORMAL LOW (ref 10–30)

## 2019-08-03 MED ORDER — IBUPROFEN 800 MG PO TABS
800.0000 mg | ORAL_TABLET | Freq: Once | ORAL | Status: AC
  Administered 2019-08-03: 800 mg via ORAL
  Filled 2019-08-03: qty 1

## 2019-08-03 MED ORDER — SILVER SULFADIAZINE 1 % EX CREA
TOPICAL_CREAM | Freq: Once | CUTANEOUS | Status: AC
  Filled 2019-08-03: qty 50

## 2019-08-03 MED ORDER — TETANUS-DIPHTH-ACELL PERTUSSIS 5-2.5-18.5 LF-MCG/0.5 IM SUSP
0.5000 mL | Freq: Once | INTRAMUSCULAR | Status: AC
  Administered 2019-08-03: 18:00:00 0.5 mL via INTRAMUSCULAR
  Filled 2019-08-03: qty 0.5

## 2019-08-03 MED ORDER — IBUPROFEN 400 MG PO TABS
400.0000 mg | ORAL_TABLET | Freq: Once | ORAL | Status: AC
  Administered 2019-08-03: 400 mg via ORAL
  Filled 2019-08-03: qty 1

## 2019-08-03 MED ORDER — IBUPROFEN 800 MG PO TABS: 800.0000 mg | ORAL_TABLET | Freq: Three times a day (TID) | ORAL | 0 refills | Status: AC
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
## 2019-08-03 NOTE — ED Notes (Signed)
TTS now 
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
## 2019-08-03 NOTE — ED Notes (Signed)
Silvadene applied to all blistered/red areas on right foot. Gauze between toes and covered with gauze.
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
## 2019-08-03 NOTE — ED Notes (Addendum)
Large fluid filled blisters noted to the distal portion of right foot. Red and painful to touch.
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
## 2019-08-03 NOTE — Discharge Instructions (Signed)
Apply silver sulfadiazine cream twice a day.  Take ibuprofen as needed for pain.  Please follow up with one of the drug treatment programs included with these discharge instructions.
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
## 2020-01-03 ENCOUNTER — Other Ambulatory Visit: Payer: Self-pay | Admitting: Obstetrics & Gynecology

## 2020-01-03 DIAGNOSIS — O3680X0 Pregnancy with inconclusive fetal viability, not applicable or unspecified: Secondary | ICD-10-CM
